# Patient Record
Sex: Female | Born: 1945 | Race: White | Hispanic: No | Smoking: Current some day smoker
Health system: Southern US, Community
[De-identification: ages and names within clinical notes are randomized; demographics above are authoritative.]

## PROBLEM LIST (undated history)

## (undated) DIAGNOSIS — F329 Major depressive disorder, single episode, unspecified: Secondary | ICD-10-CM

## (undated) DIAGNOSIS — E7800 Pure hypercholesterolemia, unspecified: Secondary | ICD-10-CM

## (undated) DIAGNOSIS — R87619 Unspecified abnormal cytological findings in specimens from cervix uteri: Secondary | ICD-10-CM

## (undated) DIAGNOSIS — R7982 Elevated C-reactive protein (CRP): Secondary | ICD-10-CM

## (undated) DIAGNOSIS — Z8742 Personal history of other diseases of the female genital tract: Secondary | ICD-10-CM

## (undated) DIAGNOSIS — F32A Depression, unspecified: Secondary | ICD-10-CM

## (undated) DIAGNOSIS — K219 Gastro-esophageal reflux disease without esophagitis: Secondary | ICD-10-CM

## (undated) DIAGNOSIS — E782 Mixed hyperlipidemia: Secondary | ICD-10-CM

## (undated) DIAGNOSIS — G250 Essential tremor: Secondary | ICD-10-CM

## (undated) HISTORY — DX: Pure hypercholesterolemia, unspecified: E78.00

## (undated) HISTORY — DX: Gastro-esophageal reflux disease without esophagitis: K21.9

## (undated) HISTORY — DX: Elevated C-reactive protein (CRP): R79.82

## (undated) HISTORY — DX: Essential tremor: G25.0

## (undated) HISTORY — PX: EYE SURGERY: SHX253

## (undated) HISTORY — DX: Personal history of other diseases of the female genital tract: Z87.42

## (undated) HISTORY — DX: Major depressive disorder, single episode, unspecified: F32.9

## (undated) HISTORY — DX: Depression, unspecified: F32.A

## (undated) HISTORY — DX: Mixed hyperlipidemia: E78.2

## (undated) HISTORY — DX: Unspecified abnormal cytological findings in specimens from cervix uteri: R87.619

---
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
## 1975-10-17 HISTORY — PX: UTERINE FIBROID SURGERY: SHX826

## 1979-10-17 HISTORY — PX: LAPAROSCOPY: SHX197

## 1990-10-16 HISTORY — PX: BREAST LUMPECTOMY: SHX2

## 1998-12-28 ENCOUNTER — Other Ambulatory Visit: Admission: RE | Admit: 1998-12-28 | Discharge: 1998-12-28 | Payer: Self-pay | Admitting: Gynecology

## 2000-01-16 ENCOUNTER — Other Ambulatory Visit: Admission: RE | Admit: 2000-01-16 | Discharge: 2000-01-16 | Payer: Self-pay | Admitting: Gynecology

## 2001-02-18 ENCOUNTER — Other Ambulatory Visit: Admission: RE | Admit: 2001-02-18 | Discharge: 2001-02-18 | Payer: Self-pay | Admitting: Gynecology

## 2002-03-04 ENCOUNTER — Other Ambulatory Visit: Admission: RE | Admit: 2002-03-04 | Discharge: 2002-03-04 | Payer: Self-pay | Admitting: Obstetrics & Gynecology

## 2003-08-11 ENCOUNTER — Other Ambulatory Visit: Admission: RE | Admit: 2003-08-11 | Discharge: 2003-08-11 | Payer: Self-pay | Admitting: Gynecology

## 2003-09-21 ENCOUNTER — Emergency Department (HOSPITAL_COMMUNITY): Admission: EM | Admit: 2003-09-21 | Discharge: 2003-09-21 | Payer: Self-pay | Admitting: Emergency Medicine

## 2004-10-16 LAB — HM DEXA SCAN

## 2005-01-14 DIAGNOSIS — R7982 Elevated C-reactive protein (CRP): Secondary | ICD-10-CM

## 2005-01-14 DIAGNOSIS — E782 Mixed hyperlipidemia: Secondary | ICD-10-CM

## 2005-01-14 DIAGNOSIS — E7800 Pure hypercholesterolemia, unspecified: Secondary | ICD-10-CM

## 2005-01-14 HISTORY — DX: Pure hypercholesterolemia, unspecified: E78.00

## 2005-01-14 HISTORY — DX: Mixed hyperlipidemia: E78.2

## 2005-01-14 HISTORY — DX: Elevated C-reactive protein (CRP): R79.82

## 2005-01-18 ENCOUNTER — Other Ambulatory Visit: Admission: RE | Admit: 2005-01-18 | Discharge: 2005-01-18 | Payer: Self-pay | Admitting: Family Medicine

## 2006-03-01 ENCOUNTER — Other Ambulatory Visit: Admission: RE | Admit: 2006-03-01 | Discharge: 2006-03-01 | Payer: Self-pay | Admitting: Family Medicine

## 2006-10-16 HISTORY — PX: COSMETIC SURGERY: SHX468

## 2007-03-04 ENCOUNTER — Other Ambulatory Visit: Admission: RE | Admit: 2007-03-04 | Discharge: 2007-03-04 | Payer: Self-pay | Admitting: Family Medicine

## 2008-03-04 ENCOUNTER — Other Ambulatory Visit: Admission: RE | Admit: 2008-03-04 | Discharge: 2008-03-04 | Payer: Self-pay | Admitting: Family Medicine

## 2009-03-04 ENCOUNTER — Other Ambulatory Visit: Admission: RE | Admit: 2009-03-04 | Discharge: 2009-03-04 | Payer: Self-pay | Admitting: Family Medicine

## 2010-04-28 LAB — HM MAMMOGRAPHY: HM Mammogram: NEGATIVE

## 2011-02-15 ENCOUNTER — Encounter: Payer: Self-pay | Admitting: Family Medicine

## 2011-02-15 DIAGNOSIS — G43909 Migraine, unspecified, not intractable, without status migrainosus: Secondary | ICD-10-CM | POA: Insufficient documentation

## 2011-02-27 ENCOUNTER — Encounter: Payer: Self-pay | Admitting: Family Medicine

## 2011-02-27 ENCOUNTER — Ambulatory Visit (INDEPENDENT_AMBULATORY_CARE_PROVIDER_SITE_OTHER): Payer: 59 | Admitting: Family Medicine

## 2011-02-27 VITALS — BP 100/64 | HR 64 | Ht 64.5 in | Wt 157.0 lb

## 2011-02-27 DIAGNOSIS — M79609 Pain in unspecified limb: Secondary | ICD-10-CM

## 2011-02-27 DIAGNOSIS — E7800 Pure hypercholesterolemia, unspecified: Secondary | ICD-10-CM | POA: Insufficient documentation

## 2011-02-27 DIAGNOSIS — M79606 Pain in leg, unspecified: Secondary | ICD-10-CM

## 2011-02-27 DIAGNOSIS — G252 Other specified forms of tremor: Secondary | ICD-10-CM

## 2011-02-27 DIAGNOSIS — G250 Essential tremor: Secondary | ICD-10-CM | POA: Insufficient documentation

## 2011-02-27 NOTE — Progress Notes (Signed)
Patient presents to re-establish care (former patient of mine at [place]).  Saw a physician at [place] at [facility] on 10/26/10 and got her medications refilled, but prefers to remain under my care. Had labs done in January, but never got results.  Scheduled for CPE in June.  Essential tremor--previously cared for by Dr. Epimenio Foot, but released to my care on her current, stable medical regimen. Doing well on this regimen.  Elevated cholesterol--denies side effects or problems from the Crestor.  Had some problems initially taking the 40mg , had to cut to 20mg , but eventually got back up to full 40mg  and is tolerating just fine.  Previously didn't do well with Lipitor.  Had a pinched nerve in her L leg back in September (similar to a problem year ago).  Seems to be intermittent, comes and goes.  Sometimes is from hip down to the foot (laterally), other times is not the whole leg.  Isn't bothering her now.  When it hurts, it bothers her to sleep on the left side.  Doing better overall, tries to avoid sleeping on left side.  Takes Aleve prn with good results.  Denies ever having back pain, denies weakness or numbness/tingling.  Past Medical History  Diagnosis Date  . GERD (gastroesophageal reflux disease)   . Tremor, essential   . Migraine   . Depression   . History of endometriosis   . Elevated cholesterol 01/2005  . Elevated triglycerides with high cholesterol 01/2005  . Elevated C-reactive protein (CRP) 01/2005    Past Surgical History  Procedure Date  . Colonoscopy 05/26/2008  . Uterine fibroid surgery 1977  . Appendectomy 1977  . Laproscopy 1981    endometriosis  . Abdominal adhesion surgery 1981  . R breast lump 1992    benign  . Tonsilectomy, adenoidectomy, bilateral myringotomy and tubes 1953    age 65  . Plastic surgery-neck 2008    History   Social History  . Marital Status: Divorced    Spouse Name: N/A    Number of Children: 1  . Years of Education: N/A   Occupational History    . Retired 06/2010 [employer]   Social History Main Topics  . Smoking status: Current Everyday Smoker -- 0.5 packs/day for 30 years    Types: Cigarettes  . Smokeless tobacco: Not on file   Comment: 1/2 ppd  . Alcohol Use: Yes     rarely, 1-2 times per year   . Drug Use: No  . Sexually Active: Not on file   Other Topics Concern  . Not on file   Social History Narrative  . No narrative on file    Family History  Problem Relation Age of Onset  . Diabetes Mother   . Cancer Father     throat  . Heart disease Father 63  . Stroke Father 96  . Depression Brother     Current outpatient prescriptions:aspirin 81 MG tablet, Take 81 mg by mouth daily.  , Disp: , Rfl: ;  Calcium Carbonate-Vitamin D (CALCIUM + D) 600-200 MG-UNIT TABS, Take 1,200 mg by mouth daily.  , Disp: , Rfl: ;  clonazePAM (KLONOPIN) 2 MG tablet, Take 2 mg by mouth 2 (two) times daily as needed.  , Disp: , Rfl: ;  nadolol (CORGARD) 40 MG tablet, Take 40 mg by mouth 3 (three) times daily.  , Disp: , Rfl:  Naproxen Sodium (ALEVE) 220 MG CAPS, Take 1 each by mouth as needed.  , Disp: , Rfl: ;  omeprazole (PRILOSEC) 20 MG capsule, Take 20 mg by mouth daily.  , Disp: , Rfl: ;  rosuvastatin (CRESTOR) 40 MG tablet, Take 40 mg by mouth daily.  , Disp: , Rfl: ;  sertraline (ZOLOFT) 100 MG tablet, Take 100 mg by mouth daily.  , Disp: , Rfl:   No Known Allergies  ROS:  Denies fever, n/v/d or other bowel problems, no urinary symptoms, rash, URI symptoms. No chest pain, SOB, edema. Denies depression/anxiety (controlled).  See HPI and patient's ROS on forms  Physical Exam: BP 100/64  Pulse 64  Ht 5' 4.5" (1.638 m)  Wt 157 lb (71.215 kg)  BMI 26.53 kg/m2 Well developed, pleasant female in no distress HEENT:  PERRL, EOMI, conjunctiva clear; OP clear Neck: No lymphadenopathy or thyromegaly, no carotid bruit Heart:  Regular rate and rhythm, no murmurs, rubs, gallops or ectopy Lungs:  Clear bilaterally, without wheezes, rales or  ronchi Abdomen:  Soft, nontender, nondistended, no hepatosplenomegaly or masses, normal bowel sounds Extremities:  No clubbing, cyanosis or edema, 2+ pulses. No tenderness at trochanteric bursa or along ITB Neuro:  Alert and oriented x 3, cranial nerves grossly intact.  DTR's 2+ and symmetric.  Normal strength and sensation Back:  No spine or CVA tenderness Skin: no rashes or suspicious lesions Psych:  Normal mood, affect, hygiene and grooming, normal speech, eye contact  Assessment and Plan: 1. Leg pain   2. Benign essential tremor   3. Pure hypercholesterolemia    Leg pain--intermittent, currently asymptomatic.  May be radicular.  Continue with OTC NSAID's prn.  F/U for re-eval if worsening pain, weakness, numbness or other concerns.  Discussed stretches, heat/ice prn.  Tremor--well controlled on current regimen.  Doesn't need refills at this time  Hyperlipidemia--Await [facility] records for review of labs.  Per patient, had labs in January.  Will need future orders put in for labs prior to her CPE next month.  Continue current regimen and low cholesterol diet

## 2011-03-16 ENCOUNTER — Other Ambulatory Visit: Payer: Self-pay | Admitting: Family Medicine

## 2011-03-16 DIAGNOSIS — E7800 Pure hypercholesterolemia, unspecified: Secondary | ICD-10-CM

## 2011-03-22 ENCOUNTER — Other Ambulatory Visit: Payer: 59

## 2011-03-22 DIAGNOSIS — E7800 Pure hypercholesterolemia, unspecified: Secondary | ICD-10-CM

## 2011-03-22 LAB — COMPREHENSIVE METABOLIC PANEL
AST: 23 U/L (ref 0–37)
Albumin: 4.6 g/dL (ref 3.5–5.2)
Alkaline Phosphatase: 52 U/L (ref 39–117)
Glucose, Bld: 95 mg/dL (ref 70–99)
Potassium: 4.6 mEq/L (ref 3.5–5.3)
Sodium: 140 mEq/L (ref 135–145)
Total Bilirubin: 0.5 mg/dL (ref 0.3–1.2)
Total Protein: 6.4 g/dL (ref 6.0–8.3)

## 2011-03-22 LAB — LIPID PANEL
HDL: 54 mg/dL (ref >39)
LDL Cholesterol: 75 mg/dL (ref 0–99)
Total CHOL/HDL Ratio: 3.1 Ratio
VLDL: 39 mg/dL (ref 0–40)

## 2011-03-23 ENCOUNTER — Encounter: Payer: Self-pay | Admitting: Family Medicine

## 2011-03-30 ENCOUNTER — Other Ambulatory Visit: Payer: Self-pay | Admitting: *Deleted

## 2011-03-30 ENCOUNTER — Encounter: Payer: Self-pay | Admitting: Family Medicine

## 2011-03-30 ENCOUNTER — Other Ambulatory Visit (HOSPITAL_COMMUNITY)
Admission: RE | Admit: 2011-03-30 | Discharge: 2011-03-30 | Disposition: A | Discharge status: Home / Self Care | Payer: 59 | Source: Ambulatory Visit | Attending: Family Medicine | Admitting: Family Medicine

## 2011-03-30 ENCOUNTER — Ambulatory Visit (INDEPENDENT_AMBULATORY_CARE_PROVIDER_SITE_OTHER): Payer: 59 | Admitting: Family Medicine

## 2011-03-30 VITALS — BP 122/84 | HR 64 | Ht 64.5 in | Wt 161.0 lb

## 2011-03-30 DIAGNOSIS — F17200 Nicotine dependence, unspecified, uncomplicated: Secondary | ICD-10-CM

## 2011-03-30 DIAGNOSIS — F3289 Other specified depressive episodes: Secondary | ICD-10-CM

## 2011-03-30 DIAGNOSIS — Z0000 Encounter for general adult medical examination without abnormal findings: Secondary | ICD-10-CM

## 2011-03-30 DIAGNOSIS — Z01419 Encounter for gynecological examination (general) (routine) without abnormal findings: Secondary | ICD-10-CM | POA: Insufficient documentation

## 2011-03-30 DIAGNOSIS — G250 Essential tremor: Secondary | ICD-10-CM

## 2011-03-30 DIAGNOSIS — F329 Major depressive disorder, single episode, unspecified: Secondary | ICD-10-CM

## 2011-03-30 DIAGNOSIS — E781 Pure hyperglyceridemia: Secondary | ICD-10-CM | POA: Insufficient documentation

## 2011-03-30 LAB — POCT URINALYSIS DIPSTICK
Bilirubin, UA: NEGATIVE
Ketones, UA: NEGATIVE
Leukocytes, UA: NEGATIVE
Protein, UA: NEGATIVE
Spec Grav, UA: 1.01
pH, UA: 5

## 2011-03-30 LAB — HM PAP SMEAR: HM Pap smear: NORMAL

## 2011-03-30 MED ORDER — CLONAZEPAM 1 MG PO TABS: 1.0000 mg | ORAL_TABLET | Freq: Three times a day (TID) | ORAL | Status: DC | PRN

## 2011-03-30 NOTE — Progress Notes (Signed)
Subjective:    Patient ID: Lindsey Frank, female    DOB: [birth year], 65 y.o.   MRN: [id number]  HPI Lindsey Frank is a 65 y.o. female who presents for a complete physical.  She has the following concerns: She needs refill on clonazepam.  She is now motivated to quit smoking. Tried Chantix in past and had unusual dreams--doesn't want to use again.  Immunization History  Administered Date(s) Administered  . DTaP 04/17/2010  . Pneumococcal Conjugate 04/17/2010  . Td 10/16/2000  . Zoster 03/03/2007   Last Pap smear: last year Last mammogram: due now Last colonoscopy: 8/09 Last DEXA: normal in the past (2-3 years ago) Dentist: goes twice a year Ophtho: yearly Exercise: None  Past Medical History  Diagnosis Date  . GERD (gastroesophageal reflux disease)   . Tremor, essential   . Migraine   . Depression   . History of endometriosis   . Elevated cholesterol 01/2005  . Elevated triglycerides with high cholesterol 01/2005  . Elevated C-reactive protein (CRP) 01/2005    Past Surgical History  Procedure Date  . Colonoscopy 05/26/2008  . Uterine fibroid surgery 1977  . Appendectomy 1977  . Laproscopy 1981    endometriosis  . Abdominal adhesion surgery 1981  . R breast lump 1992    benign  . Tonsilectomy, adenoidectomy, bilateral myringotomy and tubes 1953    age 44  . Plastic surgery-neck 2008    History   Social History  . Marital Status: Divorced    Spouse Name: N/A    Number of Children: 1  . Years of Education: N/A   Occupational History  . 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 Smoking status: Current Everyday Smoker -- 0.5 packs/day for 30 years    Types: Cigarettes  . Smokeless tobacco: Not on file   Comment: 1/2 ppd  . Alcohol Use: Yes     rarely, 1-2 times per year   . Drug Use: No  . Sexually Active: Not on file   Other Topics Concern  . Not on file   Social History Narrative  . No narrative on file     Family History  Problem Relation Age of Onset  . Diabetes Mother   . Cancer Father     throat  . Heart disease Father 12  . Stroke Father 33  . Depression Brother     Current outpatient prescriptions:aspirin 81 MG tablet, Take 81 mg by mouth daily.  , Disp: , Rfl: ;  Calcium Carbonate-Vitamin D (CALCIUM + D) 600-200 MG-UNIT TABS, Take 1,200 mg by mouth daily.  , Disp: , Rfl: ;  clonazePAM (KLONOPIN) 1 MG tablet, Take 1 tablet (1 mg total) by mouth 3 (three) times daily as needed for anxiety. 1/2 tablet in the morning, 1/2 tablet at noon, 1 tablet at bedtime, Disp: 180 tablet, Rfl: 1 nadolol (CORGARD) 40 MG tablet, Take 40 mg by mouth 3 (three) times daily.  , Disp: , Rfl: ;  Naproxen Sodium (ALEVE) 220 MG CAPS, Take 1 each by mouth as needed.  , Disp: , Rfl: ;  omeprazole (PRILOSEC) 20 MG capsule, Take 20 mg by mouth daily.  , Disp: , Rfl: ;  rosuvastatin (CRESTOR) 40 MG tablet, Take 40 mg by mouth daily.  , Disp: , Rfl: ;  sertraline (ZOLOFT) 100 MG tablet, Take 100 mg by mouth daily.  , Disp: , Rfl:  DISCONTD: clonazePAM (KLONOPIN) 1 MG tablet, Take 1 mg by  mouth. 1/2 tablet in the morning, 1/2 tablet at noon, 1 tablet at bedtime , Disp: , Rfl: ;  DISCONTD: clonazePAM (KLONOPIN) 2 MG tablet, Take 2 mg by mouth 2 (two) times daily as needed.  , Disp: , Rfl:   No Known Allergies  Review of Systems The patient denies anorexia, fever, headaches,  vision changes, decreased hearing, ear pain, sore throat, breast concerns, chest pain, palpitations, dizziness, syncope, dyspnea on exertion, cough, swelling, nausea, vomiting, diarrhea, constipation, abdominal pain, melena, hematochezia, indigestion/heartburn, hematuria, incontinence, dysuria ,no postmenopausal bleeding, vaginal discharge, odor or itch, genital lesions, joint pains, numbness, tingling, weakness, suspicious skin lesions, depression, anxiety, abnormal bleeding/bruising, or enlarged lymph nodes.  5 pound weight gain, essential tremor,  recurrent reflux symptoms if misses Prilosec.  UTI in April, symptoms resolved     Objective:   Physical Exam BP 122/84  Pulse 64  Ht 5' 4.5" (1.638 m)  Wt 161 lb (73.029 kg)  BMI 27.21 kg/m2  General Appearance:    Alert, cooperative, no distress, appears stated age  Head:    Normocephalic, without obvious abnormality, atraumatic  Eyes:    PERRL, conjunctiva/corneas clear, EOM's intact, fundi    benign  Ears:    Normal TM's and external ear canals  Nose:   Nares normal, mucosa normal, no drainage or sinus   tenderness  Throat:   Lips, mucosa, and tongue normal; teeth and gums normal  Neck:   Supple, no lymphadenopathy;  thyroid:  no   enlargement/tenderness/nodules; no carotid   bruit or JVD  Back:    Spine nontender, no curvature, ROM normal, no CVA     tenderness  Lungs:     Clear to auscultation bilaterally without wheezes, rales or     ronchi; respirations unlabored  Chest Wall:    No tenderness or deformity   Heart:    Regular rate and rhythm, S1 and S2 normal, no murmur, rub   or gallop  Breast Exam:    No tenderness, masses, or nipple discharge or inversion.      No axillary lymphadenopathy. WHSS R breast  Abdomen:     Soft, non-tender, nondistended, normoactive bowel sounds,    no masses, no hepatosplenomegaly  Genitalia:    Normal external genitalia without lesions.  BUS and vagina normal; cervix without lesions, or cervical motion tenderness. No abnormal vaginal discharge.  Uterus and adnexa not enlarged, nontender, no masses.  Pap performed  Rectal:    Normal tone, no masses or tenderness; guaiac negative stool  Extremities:   No clubbing, cyanosis or edema  Pulses:   2+ and symmetric all extremities  Skin:   Skin color, texture, turgor normal, no rashes or lesions  Lymph nodes:   Cervical, supraclavicular, and axillary nodes normal  Neurologic:   CNII-XII intact, normal strength, sensation and gait; reflexes 2+ and symmetric throughout. Minimal tremor          Psych:    Normal mood, affect, hygiene and grooming.          Assessment & Plan:   1. Routine general medical examination at a health care facility  POCT urinalysis dipstick, Visual acuity screening, Cytology - PAP  2. Pure hyperglyceridemia    3. Benign essential tremor  DISCONTINUED: clonazePAM (KLONOPIN) 1 MG tablet  4. Depressive disorder, not elsewhere classified    5. Tobacco use disorder     Discussed monthly self breast exams and yearly mammograms after the age of 21; at least 30 minutes of aerobic activity at  least 5 days/week; proper sunscreen use reviewed; healthy diet, including goals of calcium and vitamin D intake and alcohol recommendations (less than or equal to 1 drink/day) reviewed; regular seatbelt use; changing batteries in smoke detectors.  Immunization recommendations discussed--UTD, flu vaccines annually.  Colonoscopy recommendations reviewed--due again 2019  Patient was encouraged to quit smoking.  Discussed risks of smoking.  Instructed to start thinking about why/when patient smokes in order to come up with effective strategies to cut back or quit. Discussed available resources, including free counseling through [facility], smoking cessation classes through [facility], OTC nicotine replacements, and the possibility of assistance with prescription medication if patients own strategies fail and if patient is motivated to quit.  Printed Klonopin refill given to pt for 3 months with 1 refill  Put in future orders for 6 month labs

## 2011-04-04 ENCOUNTER — Encounter: Payer: Self-pay | Admitting: Family Medicine

## 2011-05-15 ENCOUNTER — Other Ambulatory Visit: Payer: Self-pay | Admitting: *Deleted

## 2011-05-15 DIAGNOSIS — I10 Essential (primary) hypertension: Secondary | ICD-10-CM

## 2011-05-15 MED ORDER — NADOLOL 40 MG PO TABS: 40.0000 mg | ORAL_TABLET | Freq: Three times a day (TID) | ORAL | Status: DC

## 2011-10-19 ENCOUNTER — Telehealth: Payer: Self-pay | Admitting: Family Medicine

## 2011-10-19 ENCOUNTER — Other Ambulatory Visit: Payer: Self-pay | Admitting: *Deleted

## 2011-10-19 DIAGNOSIS — G250 Essential tremor: Secondary | ICD-10-CM

## 2011-10-19 DIAGNOSIS — E782 Mixed hyperlipidemia: Secondary | ICD-10-CM

## 2011-10-19 DIAGNOSIS — Z79899 Other long term (current) drug therapy: Secondary | ICD-10-CM

## 2011-10-19 MED ORDER — CLONAZEPAM 1 MG PO TABS: 1.0000 mg | ORAL_TABLET | Freq: Three times a day (TID) | ORAL | Status: DC | PRN

## 2011-10-19 NOTE — Telephone Encounter (Signed)
She is due for OV with lipid and hepatic panel prior (272.2, v58.69).  Okay to refill clonazepam x 6 mos as requested.

## 2011-10-19 NOTE — Telephone Encounter (Signed)
Spoke with patient and let her know that I called in her clonazepam #180 with 1 refill to [facility]. Patient is scheduled for fasting labs 10/23/11 and appt with Dr.Knapp to followon 10/26/11.

## 2011-10-23 ENCOUNTER — Other Ambulatory Visit: Payer: 59

## 2011-10-23 DIAGNOSIS — E782 Mixed hyperlipidemia: Secondary | ICD-10-CM

## 2011-10-23 DIAGNOSIS — Z79899 Other long term (current) drug therapy: Secondary | ICD-10-CM

## 2011-10-23 LAB — LIPID PANEL
Cholesterol: 173 mg/dL (ref 0–200)
HDL: 56 mg/dL (ref >39)
LDL Cholesterol: 72 mg/dL (ref 0–99)
Triglycerides: 226 mg/dL — ABNORMAL HIGH (ref <150)
VLDL: 45 mg/dL — ABNORMAL HIGH (ref 0–40)

## 2011-10-23 LAB — HEPATIC FUNCTION PANEL
ALT: 18 U/L (ref 0–35)
Albumin: 4.7 g/dL (ref 3.5–5.2)
Alkaline Phosphatase: 64 U/L (ref 39–117)
Indirect Bilirubin: 0.4 mg/dL (ref 0.0–0.9)
Total Protein: 6.8 g/dL (ref 6.0–8.3)

## 2011-10-26 ENCOUNTER — Encounter: Payer: Self-pay | Admitting: Family Medicine

## 2011-10-26 ENCOUNTER — Ambulatory Visit (INDEPENDENT_AMBULATORY_CARE_PROVIDER_SITE_OTHER): Payer: 59 | Admitting: Family Medicine

## 2011-10-26 VITALS — BP 110/74 | HR 60 | Ht 64.5 in | Wt 168.0 lb

## 2011-10-26 DIAGNOSIS — E782 Mixed hyperlipidemia: Secondary | ICD-10-CM

## 2011-10-26 DIAGNOSIS — G250 Essential tremor: Secondary | ICD-10-CM

## 2011-10-26 DIAGNOSIS — I10 Essential (primary) hypertension: Secondary | ICD-10-CM

## 2011-10-26 MED ORDER — NADOLOL 40 MG PO TABS: ORAL_TABLET | ORAL | Status: DC

## 2011-10-26 NOTE — Progress Notes (Signed)
Patient presents for 6 month follow up on hyperlipidemia.  Had some muscle aches when increased from 40 mg every other day to taking it daily, similar to when on Lipitor.  But she started a walking regimen, and symptoms are better.  Compliant with taking Crestor 40 mg every day. Admits diet wasn't as good during the holidays.  Hasn't been taking fish oil, has had some side effects in the past.  Tremor is well controlled on her current regimen.  Needs refill of Nadolol.  Just had Klonipin refilled.  Lab Results  Component Value Date   CHOL 173 10/23/2011   HDL 56 10/23/2011   LDLCALC 72 10/23/2011   TRIG 226* 10/23/2011   CHOLHDL 3.1 10/23/2011   Lab Results  Component Value Date   ALT 18 10/23/2011   AST 24 10/23/2011   ALKPHOS 64 10/23/2011   BILITOT 0.5 10/23/2011   Past Medical History  Diagnosis Date  . GERD (gastroesophageal reflux disease)   . Tremor, essential   . Migraine   . Depression   . History of endometriosis   . Elevated cholesterol 01/2005  . Elevated triglycerides with high cholesterol 01/2005  . Elevated C-reactive protein (CRP) 01/2005    Past Surgical History  Procedure Date  . Colonoscopy 05/26/2008  . Uterine fibroid surgery 1977  . Appendectomy 1977  . Laproscopy 1981    endometriosis  . Abdominal adhesion surgery 1981  . R breast lump 1992    benign  . Tonsilectomy, adenoidectomy, bilateral myringotomy and tubes 1953    age 70  . Plastic surgery-neck 2008    History   Social History  . Marital Status: Divorced    Spouse Name: N/A    Number of Children: 1  . Years of Education: N/A   Occupational History  . 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 Smoking status: Current Everyday Smoker -- 0.5 packs/day for 30 years    Types: Cigarettes  . Smokeless tobacco: Not on file   Comment: 1/2 ppd  . Alcohol Use: Yes     rarely, 1-2 times per year   . Drug Use: No  . Sexually Active: Not on file   Other Topics Concern  . Not  on file   Social History Narrative  . No narrative on file    Family History  Problem Relation Age of Onset  . Diabetes Mother   . Cancer Father     throat  . Heart disease Father 27  . Stroke Father 68  . Depression Brother    Current Outpatient Prescriptions on File Prior to Visit  Medication Sig Dispense Refill  . aspirin 81 MG tablet Take 81 mg by mouth daily.        . Calcium Carbonate-Vitamin D (CALCIUM + D) 600-200 MG-UNIT TABS Take 1,200 mg by mouth daily.        . clonazePAM (KLONOPIN) 1 MG tablet Take 1 tablet (1 mg total) by mouth 3 (three) times daily as needed for anxiety. 1/2 tablet in the morning, 1/2 tablet at noon, 1 tablet at bedtime  180 tablet  1  . Naproxen Sodium (ALEVE) 220 MG CAPS Take 1 each by mouth as needed.        Marland Kitchen omeprazole (PRILOSEC) 20 MG capsule Take 20 mg by mouth daily.        . rosuvastatin (CRESTOR) 40 MG tablet Take 40 mg by mouth daily.        Marland Kitchen  sertraline (ZOLOFT) 100 MG tablet Take 100 mg by mouth daily.         No Known Allergies  ROS:  Denies nausea, vomiting, chest pain, palpitations, headaches, fever, URI symptoms  PHYSICAL EXAM: BP 110/74  Pulse 60  Ht 5' 4.5" (1.638 m)  Wt 168 lb (76.204 kg)  BMI 28.39 kg/m2 Well developed, pleasant female, no distress Neck: no lymphadenopathy Heart: regular rate and rhythm without murmur Lungs: clear bilaterally Abdomen: soft, nontender, no organomegaly or mass Extremities: no edema Skin: no rash Psych: normal mood, affect, hygiene and grooming Neuro: alert, oriented, minimal tremor  ASSESSMENT/PLAN: 1. Mixed hyperlipidemia    2. Benign essential tremor  nadolol (CORGARD) 40 MG tablet   controlled with klonipin and nadolol  3. Unspecified essential hypertension  DISCONTINUED: nadolol (CORGARD) 40 MG tablet   this dx is incorrect.  Nadolol was associated with this dx in error, when it is rx'd for tremor--unable to delete from visit dx due to associated rx sent   TG above goal,  related to recent diet. Continue Crestor 40mg .  Dietary changes reviewed.  Re-try fish oil  F/u 6 months with labs prior

## 2011-10-26 NOTE — Patient Instructions (Signed)
Cut back on snacking, sweets, fried foods.  Try fish oil again--3000-4000 mg daily Keep up your exercise program  Continue your current medications

## 2011-11-20 ENCOUNTER — Telehealth: Payer: Self-pay | Admitting: Family Medicine

## 2011-11-20 NOTE — Telephone Encounter (Signed)
Called patient and let her know that I did escribe the medication on 10/19/11, I called [facility] and they told me that this particular medication cannot be escribed so I went ahead and recalled to the pharmacist today for #180 clonazepam #180 with 1 refill and I called patient to explain and make sure that she had enough until her prescription arrived, she said she did.

## 2012-01-25 ENCOUNTER — Telehealth: Payer: Self-pay | Admitting: Family Medicine

## 2012-01-25 DIAGNOSIS — G250 Essential tremor: Secondary | ICD-10-CM

## 2012-01-25 MED ORDER — CLONAZEPAM 1 MG PO TABS: 1.0000 mg | ORAL_TABLET | Freq: Three times a day (TID) | ORAL | Status: DC | PRN

## 2012-01-25 NOTE — Telephone Encounter (Signed)
Okay for #90, no refill 

## 2012-01-25 NOTE — Telephone Encounter (Signed)
Called in #180 no refill to [facility].

## 2012-01-25 NOTE — Telephone Encounter (Signed)
Patient is changing pharmacies, no longer using [facility].

## 2012-01-25 NOTE — Telephone Encounter (Signed)
This was sent in January to [facility] for 3 month supply with a refill.  Is she no longer getting meds from [facility]? (should have refill)

## 2012-02-08 ENCOUNTER — Telehealth: Payer: Self-pay | Admitting: Family Medicine

## 2012-02-08 DIAGNOSIS — G250 Essential tremor: Secondary | ICD-10-CM

## 2012-02-08 MED ORDER — NADOLOL 40 MG PO TABS: ORAL_TABLET | ORAL | Status: DC

## 2012-02-08 NOTE — Telephone Encounter (Signed)
Sent in #270 no refill to pharmacy

## 2012-02-08 NOTE — Telephone Encounter (Signed)
Was "no print", probably was supposed to be sent "normal"--check with Martie Lee and re-do if necessary.  Thanks

## 2012-02-08 NOTE — Telephone Encounter (Deleted)
Is this okay?

## 2012-03-20 DIAGNOSIS — M545 Low back pain, unspecified: Secondary | ICD-10-CM | POA: Diagnosis not present

## 2012-03-20 DIAGNOSIS — M999 Biomechanical lesion, unspecified: Secondary | ICD-10-CM | POA: Diagnosis not present

## 2012-03-21 DIAGNOSIS — M999 Biomechanical lesion, unspecified: Secondary | ICD-10-CM | POA: Diagnosis not present

## 2012-03-21 DIAGNOSIS — M545 Low back pain, unspecified: Secondary | ICD-10-CM | POA: Diagnosis not present

## 2012-03-22 DIAGNOSIS — M999 Biomechanical lesion, unspecified: Secondary | ICD-10-CM | POA: Diagnosis not present

## 2012-03-22 DIAGNOSIS — M545 Low back pain, unspecified: Secondary | ICD-10-CM | POA: Diagnosis not present

## 2012-03-25 DIAGNOSIS — M545 Low back pain, unspecified: Secondary | ICD-10-CM | POA: Diagnosis not present

## 2012-03-25 DIAGNOSIS — M999 Biomechanical lesion, unspecified: Secondary | ICD-10-CM | POA: Diagnosis not present

## 2012-03-26 DIAGNOSIS — M545 Low back pain, unspecified: Secondary | ICD-10-CM | POA: Diagnosis not present

## 2012-03-26 DIAGNOSIS — M999 Biomechanical lesion, unspecified: Secondary | ICD-10-CM | POA: Diagnosis not present

## 2012-03-27 DIAGNOSIS — M545 Low back pain, unspecified: Secondary | ICD-10-CM | POA: Diagnosis not present

## 2012-03-27 DIAGNOSIS — M999 Biomechanical lesion, unspecified: Secondary | ICD-10-CM | POA: Diagnosis not present

## 2012-03-28 DIAGNOSIS — M545 Low back pain, unspecified: Secondary | ICD-10-CM | POA: Diagnosis not present

## 2012-03-28 DIAGNOSIS — M999 Biomechanical lesion, unspecified: Secondary | ICD-10-CM | POA: Diagnosis not present

## 2012-04-01 DIAGNOSIS — M999 Biomechanical lesion, unspecified: Secondary | ICD-10-CM | POA: Diagnosis not present

## 2012-04-01 DIAGNOSIS — M545 Low back pain, unspecified: Secondary | ICD-10-CM | POA: Diagnosis not present

## 2012-04-02 DIAGNOSIS — M545 Low back pain, unspecified: Secondary | ICD-10-CM | POA: Diagnosis not present

## 2012-04-02 DIAGNOSIS — M999 Biomechanical lesion, unspecified: Secondary | ICD-10-CM | POA: Diagnosis not present

## 2012-04-03 DIAGNOSIS — M545 Low back pain, unspecified: Secondary | ICD-10-CM | POA: Diagnosis not present

## 2012-04-03 DIAGNOSIS — M999 Biomechanical lesion, unspecified: Secondary | ICD-10-CM | POA: Diagnosis not present

## 2012-04-08 DIAGNOSIS — M545 Low back pain, unspecified: Secondary | ICD-10-CM | POA: Diagnosis not present

## 2012-04-08 DIAGNOSIS — M999 Biomechanical lesion, unspecified: Secondary | ICD-10-CM | POA: Diagnosis not present

## 2012-04-09 DIAGNOSIS — M545 Low back pain, unspecified: Secondary | ICD-10-CM | POA: Diagnosis not present

## 2012-04-09 DIAGNOSIS — M999 Biomechanical lesion, unspecified: Secondary | ICD-10-CM | POA: Diagnosis not present

## 2012-04-10 DIAGNOSIS — M545 Low back pain, unspecified: Secondary | ICD-10-CM | POA: Diagnosis not present

## 2012-04-10 DIAGNOSIS — M999 Biomechanical lesion, unspecified: Secondary | ICD-10-CM | POA: Diagnosis not present

## 2012-04-12 DIAGNOSIS — M999 Biomechanical lesion, unspecified: Secondary | ICD-10-CM | POA: Diagnosis not present

## 2012-04-12 DIAGNOSIS — M545 Low back pain, unspecified: Secondary | ICD-10-CM | POA: Diagnosis not present

## 2012-04-15 DIAGNOSIS — M545 Low back pain, unspecified: Secondary | ICD-10-CM | POA: Diagnosis not present

## 2012-04-15 DIAGNOSIS — M999 Biomechanical lesion, unspecified: Secondary | ICD-10-CM | POA: Diagnosis not present

## 2012-04-16 ENCOUNTER — Telehealth: Payer: Self-pay | Admitting: Family Medicine

## 2012-04-16 DIAGNOSIS — M999 Biomechanical lesion, unspecified: Secondary | ICD-10-CM | POA: Diagnosis not present

## 2012-04-16 DIAGNOSIS — M545 Low back pain, unspecified: Secondary | ICD-10-CM | POA: Diagnosis not present

## 2012-04-16 DIAGNOSIS — F3289 Other specified depressive episodes: Secondary | ICD-10-CM

## 2012-04-16 DIAGNOSIS — E782 Mixed hyperlipidemia: Secondary | ICD-10-CM

## 2012-04-16 DIAGNOSIS — F329 Major depressive disorder, single episode, unspecified: Secondary | ICD-10-CM

## 2012-04-16 MED ORDER — ROSUVASTATIN CALCIUM 40 MG PO TABS: 40.0000 mg | ORAL_TABLET | Freq: Every day | ORAL | Status: DC

## 2012-04-16 MED ORDER — SERTRALINE HCL 100 MG PO TABS: 100.0000 mg | ORAL_TABLET | Freq: Every day | ORAL | Status: DC

## 2012-04-16 NOTE — Telephone Encounter (Signed)
done

## 2012-04-17 DIAGNOSIS — M545 Low back pain, unspecified: Secondary | ICD-10-CM | POA: Diagnosis not present

## 2012-04-17 DIAGNOSIS — M999 Biomechanical lesion, unspecified: Secondary | ICD-10-CM | POA: Diagnosis not present

## 2012-04-22 DIAGNOSIS — M545 Low back pain, unspecified: Secondary | ICD-10-CM | POA: Diagnosis not present

## 2012-04-22 DIAGNOSIS — M999 Biomechanical lesion, unspecified: Secondary | ICD-10-CM | POA: Diagnosis not present

## 2012-04-23 DIAGNOSIS — M545 Low back pain, unspecified: Secondary | ICD-10-CM | POA: Diagnosis not present

## 2012-04-23 DIAGNOSIS — M999 Biomechanical lesion, unspecified: Secondary | ICD-10-CM | POA: Diagnosis not present

## 2012-04-24 ENCOUNTER — Ambulatory Visit (INDEPENDENT_AMBULATORY_CARE_PROVIDER_SITE_OTHER): Payer: Medicare Other | Admitting: Family Medicine

## 2012-04-24 ENCOUNTER — Encounter: Payer: Self-pay | Admitting: Family Medicine

## 2012-04-24 VITALS — BP 124/80 | HR 68 | Ht 64.24 in | Wt 178.0 lb

## 2012-04-24 DIAGNOSIS — Z01419 Encounter for gynecological examination (general) (routine) without abnormal findings: Secondary | ICD-10-CM

## 2012-04-24 DIAGNOSIS — M999 Biomechanical lesion, unspecified: Secondary | ICD-10-CM | POA: Diagnosis not present

## 2012-04-24 DIAGNOSIS — E782 Mixed hyperlipidemia: Secondary | ICD-10-CM | POA: Diagnosis not present

## 2012-04-24 DIAGNOSIS — R635 Abnormal weight gain: Secondary | ICD-10-CM | POA: Diagnosis not present

## 2012-04-24 DIAGNOSIS — F17200 Nicotine dependence, unspecified, uncomplicated: Secondary | ICD-10-CM | POA: Diagnosis not present

## 2012-04-24 DIAGNOSIS — G250 Essential tremor: Secondary | ICD-10-CM | POA: Diagnosis not present

## 2012-04-24 DIAGNOSIS — Z79899 Other long term (current) drug therapy: Secondary | ICD-10-CM

## 2012-04-24 DIAGNOSIS — G252 Other specified forms of tremor: Secondary | ICD-10-CM | POA: Diagnosis not present

## 2012-04-24 DIAGNOSIS — Z0000 Encounter for general adult medical examination without abnormal findings: Secondary | ICD-10-CM | POA: Diagnosis not present

## 2012-04-24 DIAGNOSIS — M545 Low back pain, unspecified: Secondary | ICD-10-CM | POA: Diagnosis not present

## 2012-04-24 LAB — CBC WITH DIFFERENTIAL/PLATELET
Basophils Absolute: 0.1 10*3/uL (ref 0.0–0.1)
HCT: 41.2 % (ref 36.0–46.0)
Lymphocytes Relative: 35 % (ref 12–46)
Monocytes Absolute: 0.7 10*3/uL (ref 0.1–1.0)
Neutro Abs: 5.1 10*3/uL (ref 1.7–7.7)
Neutrophils Relative %: 54 % (ref 43–77)
RDW: 15 % (ref 11.5–15.5)
WBC: 9.3 10*3/uL (ref 4.0–10.5)

## 2012-04-24 LAB — COMPREHENSIVE METABOLIC PANEL
ALT: 19 U/L (ref 0–35)
AST: 23 U/L (ref 0–37)
Albumin: 4.6 g/dL (ref 3.5–5.2)
BUN: 15 mg/dL (ref 6–23)
Calcium: 10 mg/dL (ref 8.4–10.5)
Chloride: 104 mEq/L (ref 96–112)
Potassium: 4.7 mEq/L (ref 3.5–5.3)
Sodium: 141 mEq/L (ref 135–145)
Total Protein: 7 g/dL (ref 6.0–8.3)

## 2012-04-24 LAB — POCT URINALYSIS DIPSTICK
Ketones, UA: NEGATIVE
Leukocytes, UA: NEGATIVE
Nitrite, UA: NEGATIVE
Protein, UA: NEGATIVE
pH, UA: 5

## 2012-04-24 LAB — TSH: TSH: 1.252 u[IU]/mL (ref 0.350–4.500)

## 2012-04-24 LAB — LIPID PANEL: Total CHOL/HDL Ratio: 2.8 Ratio

## 2012-04-24 MED ORDER — CLONAZEPAM 1 MG PO TABS: 1.0000 mg | ORAL_TABLET | Freq: Three times a day (TID) | ORAL | Status: DC

## 2012-04-24 MED ORDER — NADOLOL 40 MG PO TABS: ORAL_TABLET | ORAL | Status: DC

## 2012-04-24 NOTE — Progress Notes (Signed)
Lindsey Frank is a 66 y.o. female who presents for a complete physical. This is a welcome to Medicare physical. She has the following concerns--she is also here for fasting bloodwork/med check.   She has gained 10 pounds in the last 6 months, 17-20 in the last year.  Reports eating well, but admits to be addicted to chocolate, and admits to eating "way too much"  Tremor--well controlled with nadolol and clonazepam GERD--well controlled with omeprazole.  Denies dysphagia Hyperlipidemia follow-up:  Patient is reportedly following a low-fat, low cholesterol diet.  Compliant with medications and denies medication side effects Depression--well controlled on zoloft. Smoker--tried Chantix in 2010, but had weird hallucinatory dreams.    Others doctors providing care for patient: Dentist (Dr. Adria Devon) Ophtho (Dr. Dagoberto Ligas)  Health Maintenance: Immunization History  Administered Date(s) Administered  . DTaP 04/17/2010  . Pneumococcal Conjugate 04/17/2010  . Td 10/16/2000  . Zoster 03/03/2007  (to be changed in epic--got TdaP 04/16/2010, NOT DTaP) gets flu shots yearly (at drug store) Last Pap smear: 03/2011 Last mammogram: 04/2010 (missed it last year, plans to get) Last colonoscopy: 8/09 Last DEXA: 10/2004 Exercise: walks 0.9 miles 2-3x/week Goes to dentist and ophtho regularly.  Past Medical History  Diagnosis Date  . GERD (gastroesophageal reflux disease)   . Tremor, essential   . Migraine   . Depression   . History of endometriosis   . Elevated cholesterol 01/2005  . Elevated triglycerides with high cholesterol 01/2005  . Elevated C-reactive protein (CRP) 01/2005    Past Surgical History  Procedure Date  . Colonoscopy 05/26/2008  . Uterine fibroid surgery 1977  . Appendectomy 1977  . Laparoscopy 1981    endometriosis  . Abdominal adhesion surgery 1981  . R breast lump 1992    benign  . Tonsilectomy, adenoidectomy, bilateral myringotomy and tubes 1953    age 65  .  Plastic surgery-neck 2008    History   Social History  . Marital Status: Divorced    Spouse Name: N/A    Number of Children: 1  . Years of Education: N/A   Occupational History  . 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 Smoking status: Current Everyday Smoker -- 0.5 packs/day for 30 years    Types: Cigarettes  . Smokeless tobacco: Not on file   Comment: 1/2 ppd  . Alcohol Use: Yes     rarely, 1-2 times per year   . Drug Use: No  . Sexually Active: Not on file   Other Topics Concern  . Not on file   Social History Narrative   Lives alone    Family History  Problem Relation Age of Onset  . Diabetes Mother   . Cancer Father     throat  . Heart disease Father 13  . Stroke Father 4  . Depression Brother     Current outpatient prescriptions:aspirin 81 MG tablet, Take 81 mg by mouth daily.  , Disp: , Rfl: ;  Calcium Carbonate-Vitamin D (CALCIUM + D) 600-200 MG-UNIT TABS, Take 1,200 mg by mouth daily.  , Disp: , Rfl: ;  clonazePAM (KLONOPIN) 1 MG tablet, Take 1 tablet (1 mg total) by mouth 3 (three) times daily. 1/2 tablet in the morning, 1/2 tablet at noon, 1 tablet at bedtime, Disp: 180 tablet, Rfl: 1 nadolol (CORGARD) 40 MG tablet, Take 1 tablet by mouth in the morning, and 2 in the evening, Disp: 270 tablet, Rfl: 1;  Naproxen Sodium (ALEVE) 220  MG CAPS, Take 1 each by mouth as needed.  , Disp: , Rfl: ;  omeprazole (PRILOSEC) 20 MG capsule, Take 20 mg by mouth daily.  , Disp: , Rfl: ;  rosuvastatin (CRESTOR) 40 MG tablet, Take 1 tablet (40 mg total) by mouth daily., Disp: 90 tablet, Rfl: 1 sertraline (ZOLOFT) 100 MG tablet, Take 1 tablet (100 mg total) by mouth daily., Disp: 90 tablet, Rfl: 1  No Known Allergies  ROS:  The patient denies anorexia, fever, headaches,  vision changes, decreased hearing, ear pain, sore throat, breast concerns, chest pain, palpitations, dizziness, syncope, dyspnea on exertion, cough, swelling, nausea, vomiting,  diarrhea, constipation, abdominal pain, melena, hematochezia, indigestion/heartburn, hematuria, incontinence, dysuria, vaginal bleeding, discharge, odor or itch, genital lesions, joint pains, numbness, tingling, weakness, depression, anxiety, abnormal bleeding/bruising, or enlarged lymph nodes.  See HPI.    PHYSICAL EXAM: BP 124/80  Pulse 68  Ht 5' 4.24" (1.632 m)  Wt 178 lb (80.74 kg)  BMI 30.33 kg/m2  General Appearance:    Alert, cooperative, no distress, appears stated age  Head:    Normocephalic, without obvious abnormality, atraumatic  Eyes:    PERRL, conjunctiva/corneas clear, EOM's intact, fundi    benign  Ears:    Normal TM's and external ear canals  Nose:   Nares normal, mucosa normal, no drainage or sinus   tenderness  Throat:   Lips, mucosa, and tongue normal; teeth and gums normal  Neck:   Supple, no lymphadenopathy;  thyroid:  no   enlargement/tenderness/nodules; no carotid   bruit or JVD  Back:    Spine nontender, no curvature, ROM normal, no CVA     tenderness  Lungs:     Clear to auscultation bilaterally without wheezes, rales or     ronchi; respirations unlabored  Chest Wall:    No tenderness or deformity   Heart:    Regular rate and rhythm, S1 and S2 normal, no murmur, rub   or gallop  Breast Exam:    No tenderness, masses, or nipple discharge or inversion.      No axillary lymphadenopathy  Abdomen:     Soft, non-tender, nondistended, normoactive bowel sounds,    no masses, no hepatosplenomegaly  Genitalia:    Normal external genitalia without lesions.  BUS and vagina normal; no cervical motion tenderness. No abnormal vaginal discharge.  Uterus and adnexa not enlarged, nontender, no masses.  Pap not performed  Rectal:    Normal tone, no masses or tenderness; guaiac negative stool  Extremities:   No clubbing, cyanosis or edema  Pulses:   2+ and symmetric all extremities  Skin:   Skin color, texture, turgor normal, no rashes or lesions. 2 nodules L leg (one upper, one  lower) that are somewhat pink, firm   Lymph nodes:   Cervical, supraclavicular, and axillary nodes normal  Neurologic:   CNII-XII intact, normal strength, sensation and gait; reflexes 2+ and symmetric throughout          Psych:   Normal mood, affect, hygiene and grooming.    EKG--sinus brady, rate 56.  No acute abnormalities, slightly poor RWP  ASSESSMENT/PLAN: 1. Routine general medical examination at a health care facility  POCT Urinalysis Dipstick, Visual acuity screening, PR ELECTROCARDIOGRAM, COMPLETE, PR ELECTROCARDIOGRAM, COMPLETE  2. Mixed hyperlipidemia  Lipid panel, Comprehensive metabolic panel  3. Benign essential tremor  nadolol (CORGARD) 40 MG tablet, clonazePAM (KLONOPIN) 1 MG tablet  4. Tobacco use disorder    5. Weight gain  TSH, CBC with Differential  6. Encounter for long-term (current) use of other medications  CBC with Differential, Comprehensive metabolic panel  7. Benign essential tremor  nadolol (CORGARD) 40 MG tablet, clonazePAM (KLONOPIN) 1 MG tablet   controlled with klonipin and nadolol   Hyperlipidemia--expect TG to remain elevated due to her diet.  If so, she prefers dietary trial, given that she is motivated to try and lose the weight she gained.  Skin lesions--?dermatofibroma.  Recommend full skin check once at derm for reassurance.  Weight gain--likely related to diet (chocolate).  Check TSH  QUIT SMOKING--set quit date for 06/24/12 (son's b-day). Counseling given.  Discussed monthly self breast exams and yearly mammograms after the age of 62; at least 30 minutes of aerobic activity at least 5 days/week; proper sunscreen use reviewed; healthy diet, including goals of calcium and vitamin D intake and alcohol recommendations (less than or equal to 1 drink/day) reviewed; regular seatbelt use; changing batteries in smoke detectors.  Immunization recommendations discussed--UTD.  Colonoscopy recommendations reviewed--UTD

## 2012-04-24 NOTE — Patient Instructions (Addendum)
HEALTH MAINTENANCE RECOMMENDATIONS:  It is recommended that you get at least 30 minutes of aerobic exercise at least 5 days/week (for weight loss, you may need as much as 60-90 minutes). This can be any activity that gets your heart rate up. This can be divided in 10-15 minute intervals if needed, but try and build up your endurance at least once a week.  Weight bearing exercise is also recommended twice weekly.  Eat a healthy diet with lots of vegetables, fruits and fiber.  "Colorful" foods have a lot of vitamins (ie green vegetables, tomatoes, red peppers, etc).  Limit sweet tea, regular sodas and alcoholic beverages, all of which has a lot of calories and sugar.  Up to 1 alcoholic drink daily may be beneficial for women (unless trying to lose weight, watch sugars).  Drink a lot of water.  Calcium recommendations are 1200-1500 mg daily (1500 mg for postmenopausal women or women without ovaries), and vitamin D 1000 IU daily.  This should be obtained from diet and/or supplements (vitamins), and calcium should not be taken all at once, but in divided doses.  Monthly self breast exams and yearly mammograms for women over the age of 27 is recommended.  Sunscreen of at least SPF 30 should be used on all sun-exposed parts of the skin when outside between the hours of 10 am and 4 pm (not just when at beach or pool, but even with exercise, golf, tennis, and yard work!)  Use a sunscreen that says "broad spectrum" so it covers both UVA and UVB rays, and make sure to reapply every 1-2 hours.  Remember to change the batteries in your smoke detectors when changing your clock times in the spring and fall.  Use your seat belt every time you are in a car, and please drive safely and not be distracted with cell phones and texting while driving.  PLEASE QUIT SMOKING!  Set a quit date.  Don't forget about free classes at [facility], 1800-QUITNOW and [facility] for free counseling.

## 2012-04-25 ENCOUNTER — Encounter: Payer: Self-pay | Admitting: Family Medicine

## 2012-05-01 ENCOUNTER — Encounter: Payer: Self-pay | Admitting: Family Medicine

## 2012-05-01 DIAGNOSIS — M545 Low back pain, unspecified: Secondary | ICD-10-CM | POA: Diagnosis not present

## 2012-05-01 DIAGNOSIS — M999 Biomechanical lesion, unspecified: Secondary | ICD-10-CM | POA: Diagnosis not present

## 2012-05-08 DIAGNOSIS — M999 Biomechanical lesion, unspecified: Secondary | ICD-10-CM | POA: Diagnosis not present

## 2012-05-08 DIAGNOSIS — M545 Low back pain, unspecified: Secondary | ICD-10-CM | POA: Diagnosis not present

## 2012-05-10 DIAGNOSIS — M999 Biomechanical lesion, unspecified: Secondary | ICD-10-CM | POA: Diagnosis not present

## 2012-05-10 DIAGNOSIS — M545 Low back pain, unspecified: Secondary | ICD-10-CM | POA: Diagnosis not present

## 2012-05-13 DIAGNOSIS — M545 Low back pain, unspecified: Secondary | ICD-10-CM | POA: Diagnosis not present

## 2012-05-13 DIAGNOSIS — M999 Biomechanical lesion, unspecified: Secondary | ICD-10-CM | POA: Diagnosis not present

## 2012-05-15 DIAGNOSIS — M545 Low back pain, unspecified: Secondary | ICD-10-CM | POA: Diagnosis not present

## 2012-05-15 DIAGNOSIS — M999 Biomechanical lesion, unspecified: Secondary | ICD-10-CM | POA: Diagnosis not present

## 2012-06-13 DIAGNOSIS — M999 Biomechanical lesion, unspecified: Secondary | ICD-10-CM | POA: Diagnosis not present

## 2012-06-13 DIAGNOSIS — M545 Low back pain, unspecified: Secondary | ICD-10-CM | POA: Diagnosis not present

## 2012-06-18 DIAGNOSIS — M545 Low back pain, unspecified: Secondary | ICD-10-CM | POA: Diagnosis not present

## 2012-06-18 DIAGNOSIS — M999 Biomechanical lesion, unspecified: Secondary | ICD-10-CM | POA: Diagnosis not present

## 2012-06-20 DIAGNOSIS — M999 Biomechanical lesion, unspecified: Secondary | ICD-10-CM | POA: Diagnosis not present

## 2012-06-20 DIAGNOSIS — M545 Low back pain, unspecified: Secondary | ICD-10-CM | POA: Diagnosis not present

## 2012-06-24 DIAGNOSIS — M999 Biomechanical lesion, unspecified: Secondary | ICD-10-CM | POA: Diagnosis not present

## 2012-06-24 DIAGNOSIS — M545 Low back pain, unspecified: Secondary | ICD-10-CM | POA: Diagnosis not present

## 2012-06-25 DIAGNOSIS — M545 Low back pain, unspecified: Secondary | ICD-10-CM | POA: Diagnosis not present

## 2012-06-25 DIAGNOSIS — M999 Biomechanical lesion, unspecified: Secondary | ICD-10-CM | POA: Diagnosis not present

## 2012-06-27 DIAGNOSIS — M545 Low back pain, unspecified: Secondary | ICD-10-CM | POA: Diagnosis not present

## 2012-06-27 DIAGNOSIS — M999 Biomechanical lesion, unspecified: Secondary | ICD-10-CM | POA: Diagnosis not present

## 2012-07-02 DIAGNOSIS — M545 Low back pain, unspecified: Secondary | ICD-10-CM | POA: Diagnosis not present

## 2012-07-02 DIAGNOSIS — M999 Biomechanical lesion, unspecified: Secondary | ICD-10-CM | POA: Diagnosis not present

## 2012-07-10 DIAGNOSIS — M545 Low back pain, unspecified: Secondary | ICD-10-CM | POA: Diagnosis not present

## 2012-07-10 DIAGNOSIS — M999 Biomechanical lesion, unspecified: Secondary | ICD-10-CM | POA: Diagnosis not present

## 2012-07-17 DIAGNOSIS — M999 Biomechanical lesion, unspecified: Secondary | ICD-10-CM | POA: Diagnosis not present

## 2012-07-17 DIAGNOSIS — M545 Low back pain, unspecified: Secondary | ICD-10-CM | POA: Diagnosis not present

## 2012-08-07 DIAGNOSIS — M545 Low back pain, unspecified: Secondary | ICD-10-CM | POA: Diagnosis not present

## 2012-08-07 DIAGNOSIS — M999 Biomechanical lesion, unspecified: Secondary | ICD-10-CM | POA: Diagnosis not present

## 2012-08-09 DIAGNOSIS — H908 Mixed conductive and sensorineural hearing loss, unspecified: Secondary | ICD-10-CM | POA: Diagnosis not present

## 2012-08-09 DIAGNOSIS — H6120 Impacted cerumen, unspecified ear: Secondary | ICD-10-CM | POA: Diagnosis not present

## 2012-08-09 DIAGNOSIS — Z23 Encounter for immunization: Secondary | ICD-10-CM | POA: Diagnosis not present

## 2012-11-04 ENCOUNTER — Telehealth: Payer: Self-pay | Admitting: Internal Medicine

## 2012-11-04 NOTE — Telephone Encounter (Signed)
Also needs a refill for nadolol 40mg  #270

## 2012-11-05 ENCOUNTER — Telehealth: Payer: Self-pay | Admitting: Internal Medicine

## 2012-11-05 ENCOUNTER — Other Ambulatory Visit: Payer: Self-pay | Admitting: *Deleted

## 2012-11-05 DIAGNOSIS — G250 Essential tremor: Secondary | ICD-10-CM

## 2012-11-05 MED ORDER — NADOLOL 40 MG PO TABS: ORAL_TABLET | ORAL | Status: DC

## 2012-11-05 NOTE — Telephone Encounter (Signed)
Done

## 2012-11-06 ENCOUNTER — Other Ambulatory Visit: Payer: Self-pay | Admitting: *Deleted

## 2012-11-06 DIAGNOSIS — F329 Major depressive disorder, single episode, unspecified: Secondary | ICD-10-CM

## 2012-11-06 DIAGNOSIS — G250 Essential tremor: Secondary | ICD-10-CM

## 2012-11-06 DIAGNOSIS — F3289 Other specified depressive episodes: Secondary | ICD-10-CM

## 2012-11-06 DIAGNOSIS — E782 Mixed hyperlipidemia: Secondary | ICD-10-CM

## 2012-11-06 MED ORDER — CLONAZEPAM 1 MG PO TABS: 1.0000 mg | ORAL_TABLET | Freq: Three times a day (TID) | ORAL | Status: DC

## 2012-11-06 MED ORDER — ROSUVASTATIN CALCIUM 40 MG PO TABS: 40.0000 mg | ORAL_TABLET | Freq: Every day | ORAL | Status: DC

## 2012-11-06 MED ORDER — SERTRALINE HCL 100 MG PO TABS: 100.0000 mg | ORAL_TABLET | Freq: Every day | ORAL | Status: DC

## 2012-11-06 NOTE — Telephone Encounter (Signed)
Refill request for clonazepam 1mg  #180 to [facility]

## 2012-11-06 NOTE — Telephone Encounter (Signed)
Patient did schedule a med check for next Wednesday. I refilled her other meds but need to know if I am okay to call in the clonazepam.

## 2012-11-06 NOTE — Telephone Encounter (Signed)
Ok to refill the clonazepam

## 2012-11-06 NOTE — Telephone Encounter (Signed)
Done

## 2012-11-13 ENCOUNTER — Encounter: Payer: Self-pay | Admitting: Family Medicine

## 2012-11-13 ENCOUNTER — Ambulatory Visit (INDEPENDENT_AMBULATORY_CARE_PROVIDER_SITE_OTHER): Payer: Medicare Other | Admitting: Family Medicine

## 2012-11-13 VITALS — BP 116/80 | HR 60 | Ht 64.25 in | Wt 174.0 lb

## 2012-11-13 DIAGNOSIS — F329 Major depressive disorder, single episode, unspecified: Secondary | ICD-10-CM | POA: Diagnosis not present

## 2012-11-13 DIAGNOSIS — G250 Essential tremor: Secondary | ICD-10-CM | POA: Diagnosis not present

## 2012-11-13 DIAGNOSIS — F3289 Other specified depressive episodes: Secondary | ICD-10-CM

## 2012-11-13 DIAGNOSIS — E782 Mixed hyperlipidemia: Secondary | ICD-10-CM | POA: Diagnosis not present

## 2012-11-13 DIAGNOSIS — F17200 Nicotine dependence, unspecified, uncomplicated: Secondary | ICD-10-CM

## 2012-11-13 DIAGNOSIS — G252 Other specified forms of tremor: Secondary | ICD-10-CM

## 2012-11-13 DIAGNOSIS — G4700 Insomnia, unspecified: Secondary | ICD-10-CM

## 2012-11-13 NOTE — Patient Instructions (Addendum)
Keep cutting back on smoking--try quitting by your birthday!  Use Tylenol PM (or other diphenhydramine) as needed at bedtime; consider melatonin. Consider trying to take the clonazepam a little later in the afternoon (3 or 4) if still having trouble falling asleep; I don't recommend taking an additional bedtime dose.

## 2012-11-13 NOTE — Progress Notes (Signed)
Chief Complaint  Patient presents with  . Med check    nonfastng med check.   Patient presents for follow up on her chronic medical problems.  She had meds refilled for 90 days last week.  Tremor--had been well controlled with nadolol and clonazepam. She is asking about increasing clonazepam to TID, wanting to take an additional pill at bedtime, to help her sleep.  Not bothered by tremors at night, but just wanting to sleep.  Takes the clonazepam at 7:30 am, and 1pm.  Before taking second dose, tremor was bad again by 4-5pm.  She has tried Tylenol PM, which sometimes works (often does but not always).  Doesn't have trouble falling asleep every night.  Sleep has been a little better since she has been doing the seated [employer]ne[employer] program.  GERD--well controlled with omeprazole. Denies dysphagia.   Hyperlipidemia follow-up: Patient is reportedly following a low-fat, low cholesterol diet. Compliant with medications and denies medication side effects.  Last lipids reviewed and have remained fairly stable over the last 3 checks.  TG's have been borderline, but last check was <200 (190's).  LDL has been at goal.  Depression--well controlled on zoloft.   Smoker--tried Chantix in 2010, but had weird hallucinatory dreams. She has been cutting back slowly, in effort to quit.  She can feel herself breathing better with exercise since cutting back on smoking  Past Medical History  Diagnosis Date  . GERD (gastroesophageal reflux disease)   . Tremor, essential   . Migraine   . Depression   . History of endometriosis   . Elevated cholesterol 01/2005  . Elevated triglycerides with high cholesterol 01/2005  . Elevated C-reactive protein (CRP) 01/2005   Past Surgical History  Procedure Date  . Colonoscopy 05/26/2008  . Uterine fibroid surgery 1977  . Appendectomy 1977  . Laparoscopy 1981    endometriosis  . Abdominal adhesion surgery 1981  . R breast lump 1992    benign  . Tonsilectomy,  adenoidectomy, bilateral myringotomy and tubes 1953    age 67  . Plastic surgery-neck 2008   History   Social History  . Marital Status: Divorced    Spouse Name: N/A    Number of Children: 66  . Years of Education: N/A   Occupational History  . 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 Smoking status: Current Every Day Smoker -- 0.5 packs/day for 30 years    Types: Cigarettes  . Smokeless tobacco: Not on file     Comment: 1/2 ppd  . Alcohol Use: Yes     Comment: rarely, 1-2 times per year   . Drug Use: No  . Sexually Active: Not on file   Other Topics Concern  . Not on file   Social History Narrative   Lives alone   Current outpatient prescriptions:aspirin 81 MG tablet, Take 81 mg by mouth daily.  , Disp: , Rfl: ;  Calcium Carbonate-Vitamin D (CALCIUM + D) 600-200 MG-UNIT TABS, Take 1,200 mg by mouth daily.  , Disp: , Rfl: ;  clonazePAM (KLONOPIN) 1 MG tablet, Take 1 tablet (1 mg total) by mouth 3 (three) times daily. 1/2 tablet in the morning, 1/2 tablet at noon, 1 tablet at bedtime, Disp: 180 tablet, Rfl: 0 nadolol (CORGARD) 40 MG tablet, Take 1 tablet by mouth in the morning, and 2 in the evening, Disp: 270 tablet, Rfl: 0;  omeprazole (PRILOSEC) 20 MG capsule, Take 20 mg by mouth daily.  ,  Disp: , Rfl: ;  rosuvastatin (CRESTOR) 40 MG tablet, Take 1 tablet (40 mg total) by mouth daily., Disp: 90 tablet, Rfl: 0;  sertraline (ZOLOFT) 100 MG tablet, Take 1 tablet (100 mg total) by mouth daily., Disp: 90 tablet, Rfl: 0 Naproxen Sodium (ALEVE) 220 MG CAPS, Take 1 each by mouth as needed.  , Disp: , Rfl:   No Known Allergies  ROS:  Denies fevers, URI symptoms, cough, shortness of breath, chest pain, palpitations.  Denies nausea, vomiting, dysphagia, bowel changes, abdominal pain, urinary complaints, bleeding/bruising, headaches, dizziness, myalgias or joint pains.  Denies worsening depression. +intermittent insomnia.  See HPI.  PHYSICAL EXAM: BP 116/80   Pulse 60  Ht 5' 4.25" (1.632 m)  Wt 174 lb (78.926 kg)  BMI 29.64 kg/m2 Well developed, pleasant female in no distress Neck: no lymphadenopathy, thyromegaly or mass Heart: regular rate and rhythm without murmur  Lungs: clear bilaterally  Abdomen: soft, nontender, no organomegaly or mass  Extremities: no edema  Skin: no rash Psych: normal mood, affect, hygiene and grooming  Neuro: alert, oriented, minimal tremor  ASSESMENT/PLAN:  1. Benign essential tremor   2. Tobacco use disorder   3. Mixed hyperlipidemia   4. Depressive disorder, not elsewhere classified   5. Insomnia    Advised to try taking afternoon dose of clonazepam at 3-4 pm rather than at 1, to see if that helps her more with sleep in the evening. Discussed that clonazepam should only be BID, not TID, and I don't want to increase dose. Continue daily exercise, which will help with sleep.  Use Tylenol PM (or other diphenhydramine) as needed at bedtime; consider melatonin.  Smoking--congratulated on cutting back.  Encouraged to set a quit date (?birthday)  Lipids--LDL controlled, TG borderline high per last lipids in July.  Reminded to follow lowfat diet, limit sweets.  All meds refilled for 90 days last week.  Will refill for another 90 days when needed.  Return July for fasting med check PLUS/AWV

## 2013-02-05 ENCOUNTER — Telehealth: Payer: Self-pay | Admitting: Internal Medicine

## 2013-02-05 DIAGNOSIS — G250 Essential tremor: Secondary | ICD-10-CM

## 2013-02-05 DIAGNOSIS — E782 Mixed hyperlipidemia: Secondary | ICD-10-CM

## 2013-02-05 DIAGNOSIS — F3289 Other specified depressive episodes: Secondary | ICD-10-CM

## 2013-02-05 DIAGNOSIS — F329 Major depressive disorder, single episode, unspecified: Secondary | ICD-10-CM

## 2013-02-05 MED ORDER — SERTRALINE HCL 100 MG PO TABS: 100.0000 mg | ORAL_TABLET | Freq: Every day | ORAL | Status: DC

## 2013-02-05 MED ORDER — NADOLOL 40 MG PO TABS: ORAL_TABLET | ORAL | Status: DC

## 2013-02-05 MED ORDER — CLONAZEPAM 1 MG PO TABS: 1.0000 mg | ORAL_TABLET | Freq: Three times a day (TID) | ORAL | Status: DC

## 2013-02-05 MED ORDER — ROSUVASTATIN CALCIUM 40 MG PO TABS: 40.0000 mg | ORAL_TABLET | Freq: Every day | ORAL | Status: DC

## 2013-02-05 NOTE — Telephone Encounter (Signed)
Also a request for crestor 40mg  #90, sertaline 100mg  #90

## 2013-02-05 NOTE — Telephone Encounter (Signed)
Okay to refill all meds for 90 days

## 2013-05-05 ENCOUNTER — Other Ambulatory Visit: Payer: Self-pay | Admitting: Family Medicine

## 2013-05-06 ENCOUNTER — Other Ambulatory Visit: Payer: Self-pay | Admitting: Family Medicine

## 2013-05-06 NOTE — Telephone Encounter (Signed)
IS THIS OK 

## 2013-05-06 NOTE — Telephone Encounter (Signed)
Ok to refill.  (has appt 7/30)

## 2013-05-14 ENCOUNTER — Encounter: Payer: Self-pay | Admitting: Family Medicine

## 2013-05-14 ENCOUNTER — Ambulatory Visit (INDEPENDENT_AMBULATORY_CARE_PROVIDER_SITE_OTHER): Payer: Medicare Other | Admitting: Family Medicine

## 2013-05-14 VITALS — BP 120/78 | HR 72 | Ht 64.25 in | Wt 178.0 lb

## 2013-05-14 DIAGNOSIS — K219 Gastro-esophageal reflux disease without esophagitis: Secondary | ICD-10-CM | POA: Diagnosis not present

## 2013-05-14 DIAGNOSIS — E782 Mixed hyperlipidemia: Secondary | ICD-10-CM

## 2013-05-14 DIAGNOSIS — G250 Essential tremor: Secondary | ICD-10-CM

## 2013-05-14 DIAGNOSIS — Z79899 Other long term (current) drug therapy: Secondary | ICD-10-CM

## 2013-05-14 DIAGNOSIS — G252 Other specified forms of tremor: Secondary | ICD-10-CM

## 2013-05-14 DIAGNOSIS — F17200 Nicotine dependence, unspecified, uncomplicated: Secondary | ICD-10-CM

## 2013-05-14 DIAGNOSIS — F3289 Other specified depressive episodes: Secondary | ICD-10-CM

## 2013-05-14 DIAGNOSIS — G43909 Migraine, unspecified, not intractable, without status migrainosus: Secondary | ICD-10-CM

## 2013-05-14 DIAGNOSIS — F329 Major depressive disorder, single episode, unspecified: Secondary | ICD-10-CM

## 2013-05-14 DIAGNOSIS — Z0000 Encounter for general adult medical examination without abnormal findings: Secondary | ICD-10-CM

## 2013-05-14 LAB — COMPREHENSIVE METABOLIC PANEL
ALT: 17 U/L (ref 0–35)
AST: 25 U/L (ref 0–37)
Albumin: 4.8 g/dL (ref 3.5–5.2)
Calcium: 10.1 mg/dL (ref 8.4–10.5)
Chloride: 103 mEq/L (ref 96–112)
Creat: 0.88 mg/dL (ref 0.50–1.10)
Potassium: 4.4 mEq/L (ref 3.5–5.3)

## 2013-05-14 LAB — HEMOCCULT GUIAC POC 1CARD (OFFICE)

## 2013-05-14 LAB — LIPID PANEL
LDL Cholesterol: 72 mg/dL (ref 0–99)
VLDL: 62 mg/dL — ABNORMAL HIGH (ref 0–40)

## 2013-05-14 NOTE — Patient Instructions (Signed)
HEALTH MAINTENANCE RECOMMENDATIONS:  It is recommended that you get at least 30 minutes of aerobic exercise at least 5 days/week (for weight loss, you may need as much as 60-90 minutes). This can be any activity that gets your heart rate up. This can be divided in 10-15 minute intervals if needed, but try and build up your endurance at least once a week.  Weight bearing exercise is also recommended twice weekly.  Eat a healthy diet with lots of vegetables, fruits and fiber.  "Colorful" foods have a lot of vitamins (ie green vegetables, tomatoes, red peppers, etc).  Limit sweet tea, regular sodas and alcoholic beverages, all of which has a lot of calories and sugar.  Up to 1 alcoholic drink daily may be beneficial for women (unless trying to lose weight, watch sugars).  Drink a lot of water.  Calcium recommendations are 1200-1500 mg daily (1500 mg for postmenopausal women or women without ovaries), and vitamin D 1000 IU daily.  This should be obtained from diet and/or supplements (vitamins), and calcium should not be taken all at once, but in divided doses.  Monthly self breast exams and yearly mammograms for women over the age of 85 is recommended.  Sunscreen of at least SPF 30 should be used on all sun-exposed parts of the skin when outside between the hours of 10 am and 4 pm (not just when at beach or pool, but even with exercise, golf, tennis, and yard work!)  Use a sunscreen that says "broad spectrum" so it covers both UVA and UVB rays, and make sure to reapply every 1-2 hours.  Remember to change the batteries in your smoke detectors when changing your clock times in the spring and fall.  Use your seat belt every time you are in a car, and please drive safely and not be distracted with cell phones and texting while driving.  PLEASE SCHEDULE YOUR MAMMOGRAM!  You are long past due in getting this.  I also recommend that you have a bone density test done.  You were given a written prescription that  should serve as an "order" for [facility].  You can schedule it for same time as mammogram, if you like--you just need to call and set up the appointments.  Please try and quit smoking--start thinking about why/when you smoke (habit, boredom, stress) in order to come up with effective strategies to cut back or quit. Available resources to help you quit include free counseling through [facility] ([facility]uit[facility] or 1-800-QUITNOW), smoking cessation classes through [facility] (call to find out schedule), over-the-counter nicotine replacements, and e-cigarettes (although this may not help break the hand-mouth habit).  Many insurance companies also have smoking cessation programs (which may decrease the cost of patches, meds if enrolled).  If these methods are not effective for you, and you are motivated to quit, return to discuss the possibility of prescription medications.

## 2013-05-14 NOTE — Progress Notes (Signed)
Chief Complaint  Patient presents with  . Med check plus    fasting med check/AWV-no complaints.   Patient presents for a med check, as well as Annual Wellness Visit.  Tremor--well controlled with nadolol and clonazepam. Taking 1 BID of the clonazepam (previously took 1/2 in am, 1/2 at noon, 1 at bedtime)--didn't like cutting, they crumbled.  It is effective using it this way, without sedation.    GERD--well controlled with omeprazole. Denies dysphagia.  Never misses a pill to know if she has recurrent symptoms.  Hyperlipidemia follow-up: Patient is reportedly following a low-fat, low cholesterol diet. Compliant with medications and denies medication side effects   Depression--well controlled on zoloft.   Has gone off meds in the past, and had recurrence of depression, some of which patient relates to work stress.  She denies any side effects of medication.  Smoker--tried Chantix in 2010, but had weird hallucinatory dreams. She has been cutting back, and plans to quit by her son's birthday in September (didn't not meet previous quit dates, but has cut back further)  All meds were refilled for 90 days last week.  AWV: Others doctors providing care for patient:  Dentist (Dr. Adria Devon)  Ophtho (Dr. Dagoberto Ligas) GI (Dr. Madilyn Fireman at [facility], 2009)  End of Life issues:  She already has Living Will and healthcare power of attorney  Depression screen:  Normal--see scanned form ADL screen--normal.  See scanned form  Immunization History  Administered Date(s) Administered  . Influenza Split 08/16/2012  . Pneumococcal Conjugate 04/17/2010  . Td 10/16/2000  . Tdap 04/17/2010  . Zoster 03/03/2007  Last Pap smear: 03/2011  Last mammogram: 04/2010--she never went last year after discussion at her physical Last colonoscopy: 8/09  Last DEXA: 10/2004  Exercise: walks 3-4/week plus [employer] class 2x/week Goes to dentist every 6 months ophtho every 2 years, went last year.  Past Medical  History  Diagnosis Date  . GERD (gastroesophageal reflux disease)   . Tremor, essential   . Migraine   . Depression   . History of endometriosis   . Elevated cholesterol 01/2005  . Elevated triglycerides with high cholesterol 01/2005  . Elevated C-reactive protein (CRP) 01/2005    Past Surgical History  Procedure Laterality Date  . Colonoscopy  05/26/2008  . Uterine fibroid surgery  1977  . Appendectomy  1977  . Laparoscopy  1981    endometriosis  . Abdominal adhesion surgery  1981  . R breast lump  1992    benign  . Tonsilectomy, adenoidectomy, bilateral myringotomy and tubes  1953    age 32  . Plastic surgery-neck  2008    History   Social History  . Marital Status: Divorced    Spouse Name: N/A    Number of Children: 1  . Years of Education: N/A   Occupational History  . 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 Smoking status: Current Every Day Smoker -- 0.25 packs/day for 30 years    Types: Cigarettes  . Smokeless tobacco: Never Used     Comment: 3-5 cigarettes/day, cutting back  . Alcohol Use: Yes     Comment: rarely, 1-2 times per year   . Drug Use: No  . Sexually Active: Not Currently   Other Topics Concern  . Not on file   Social History Narrative   Lives alone.  Son lives in [place], 4 grandchildren    Family History  Problem Relation Age of Onset  .  Diabetes Mother   . Cancer Father     throat  . Heart disease Father 42  . Stroke Father 72  . Depression Brother     Current outpatient prescriptions:aspirin 81 MG tablet, Take 81 mg by mouth daily.  , Disp: , Rfl: ;  Calcium Carbonate-Vitamin D (CALCIUM + D) 600-200 MG-UNIT TABS, Take 1,200 mg by mouth daily.  , Disp: , Rfl: ;  clonazePAM (KLONOPIN) 1 MG tablet, TAKE 1 TABLET BY MOUTH THREE TIMES DAILY( 1/2 TABLET IN THE MORNING, 1/2 TABLET AT NOON, THEN 1 TABLET EVERY NIGHT AT BEDTIME), Disp: 180 tablet, Rfl: 0 CRESTOR 40 MG tablet, TAKE 1 TABLET BY MOUTH ONCE DAILY,  Disp: 90 tablet, Rfl: 0;  nadolol (CORGARD) 40 MG tablet, TAKE 1 TABLET BY MOUTH EVERY MORNING, THEN 2 TABLETS IN THE EVENING, Disp: 270 tablet, Rfl: 0;  omeprazole (PRILOSEC) 20 MG capsule, Take 20 mg by mouth daily.  , Disp: , Rfl: ;  sertraline (ZOLOFT) 100 MG tablet, TAKE 1 TABLET BY MOUTH DAILY, Disp: 90 tablet, Rfl: 0 Naproxen Sodium (ALEVE) 220 MG CAPS, Take 1 each by mouth as needed.  , Disp: , Rfl:   No Known Allergies  ROS: The patient denies anorexia, fever, headaches, vision changes, decreased hearing, ear pain, sore throat, breast concerns, chest pain, palpitations, dizziness, syncope, dyspnea on exertion, cough, swelling, nausea, vomiting, diarrhea, constipation, abdominal pain, melena, hematochezia, indigestion/heartburn, hematuria, incontinence, dysuria, vaginal bleeding, discharge, odor or itch, genital lesions, joint pains, numbness, tingling, weakness, depression, anxiety, abnormal bleeding/bruising, or enlarged lymph nodes. See HPI Some chronic mild postnasal drip.  No sinus pain or congestion.  PHYSICAL EXAM:  BP 120/78  Pulse 72  Ht 5' 4.25" (1.632 m)  Wt 178 lb (80.74 kg)  BMI 30.31 kg/m2  General Appearance:  Alert, cooperative, no distress, appears stated age   Head:  Normocephalic, without obvious abnormality, atraumatic   Eyes:  PERRL, conjunctiva/corneas clear, EOM's intact, fundi  benign   Ears:  Normal TM's and external ear canals   Nose:  Nares normal, mucosa normal, no drainage or sinus tenderness   Throat:  Lips, mucosa, and tongue normal; teeth and gums normal. Mild cobblestoning posteriorly  Neck:  Supple, no lymphadenopathy; thyroid: no enlargement/tenderness/nodules; no carotid  bruit or JVD   Back:  Spine nontender, no curvature, ROM normal, no CVA tenderness   Lungs:  Clear to auscultation bilaterally without wheezes, rales or ronchi; respirations unlabored   Chest Wall:  No tenderness or deformity   Heart:  Regular rate and rhythm, S1 and S2 normal,  no murmur, rub  or gallop   Breast Exam:  No tenderness, masses, or nipple discharge or inversion. No axillary lymphadenopathy   Abdomen:  Soft, non-tender, nondistended, normoactive bowel sounds,  no masses, no hepatosplenomegaly   Genitalia:  Normal external genitalia without lesions. BUS and vagina normal; no cervical motion tenderness. No abnormal vaginal discharge. Uterus and adnexa not enlarged, nontender, no masses. Pap not performed   Rectal:  Normal tone, no masses or tenderness; guaiac negative stool   Extremities:  No clubbing, cyanosis or edema   Pulses:  2+ and symmetric all extremities   Skin:  Skin color, texture, turgor normal, no rashes or lesions. 2 nodules L leg (one upper/lateral, measuring 5mm, one medially on lower portion of upper left leg, measuring 3-58mm) that are somewhat pink, firm   Lymph nodes:  Cervical, supraclavicular, and axillary nodes normal   Neurologic:  CNII-XII intact, normal strength, sensation and gait; reflexes  2+ and symmetric throughout          Psych: Normal mood, affect, hygiene and grooming.   ASSESSMENT/PLAN:  Mixed hyperlipidemia - Plan: Lipid panel, Comprehensive metabolic panel, TSH  Benign essential tremor - controlled with nadolol and clonazepam  GERD (gastroesophageal reflux disease)  Encounter for long-term (current) use of other medications - Plan: Lipid panel, Comprehensive metabolic panel, TSH  Depressive disorder, not elsewhere classified - well controlled  Tobacco use disorder  Migraine - on nadolol for tremor and migraine prevention.  Not having any migraines  Medicare annual wellness visit, initial  Depression:  Discussed potential to try decreasing dose to 50mg  (with potential to taper off, if doing well). She has had recurrent episodes of depression after coming off in past, but she thinks related to work stress. She is now retired, but declines attempting to lower dose.  Advised that she can cut tablet in 1/2 at any  time, but to increase back to full tablet if having any recurrent symptoms.  She declines any dose change currently.  GERD--discussed that her symptoms of reflux might improve as she is cutting back/quitting smoking. She can try to cut back (qod, then taper off, using when having known foods that trigger reflux).  Discussed risks of untreated reflux and longterm PPI use.  Skin lesions--dermatofibroma vs SCC.  She plans to schedule appt with derm (but said that last year and hasn't gone).  Pt denies any changes in size/color.  Measured today.  Consider excising here if any change (rather than waiting for her to see derm)  Discussed monthly self breast exams and yearly mammograms after the age of 59; at least 30 minutes of aerobic activity at least 5 days/week; proper sunscreen use reviewed; healthy diet, including goals of calcium and vitamin D intake and alcohol recommendations (less than or equal to 1 drink/day) reviewed; regular seatbelt use; changing batteries in smoke detectors. Immunization recommendations discussed--UTD.  She had pneumovax prior to age of 23 due to her being a smoker.  Recommend repeat in 04/2015 (5 years from last). Colonoscopy recommendations reviewed--UTD  Past due for mammogram.  Recommended she schedule mammogram and DEXA (rx given for [facility])  End of Life discussion:  She already has Living Will and healthcare power of attorney.  MOST form filled out.    F/u 6 month med check, sooner prn

## 2013-08-03 ENCOUNTER — Other Ambulatory Visit: Payer: Self-pay | Admitting: Family Medicine

## 2013-08-04 ENCOUNTER — Other Ambulatory Visit: Payer: Self-pay | Admitting: Family Medicine

## 2013-08-04 DIAGNOSIS — Z23 Encounter for immunization: Secondary | ICD-10-CM | POA: Diagnosis not present

## 2013-08-04 NOTE — Telephone Encounter (Signed)
Called in med to pharmacy  

## 2013-08-04 NOTE — Telephone Encounter (Signed)
Yes, ok to refill #180, no add'l refill

## 2013-08-04 NOTE — Telephone Encounter (Signed)
Is this okay to fill? 

## 2013-08-11 ENCOUNTER — Other Ambulatory Visit: Payer: Self-pay | Admitting: Family Medicine

## 2013-10-19 ENCOUNTER — Other Ambulatory Visit: Payer: Self-pay | Admitting: Family Medicine

## 2013-10-20 NOTE — Telephone Encounter (Signed)
Is the klonopin ok to refill?

## 2013-10-20 NOTE — Telephone Encounter (Signed)
Ok to refill.  Has appt scheduled

## 2013-10-22 ENCOUNTER — Ambulatory Visit (INDEPENDENT_AMBULATORY_CARE_PROVIDER_SITE_OTHER): Payer: Medicare Other | Admitting: Family Medicine

## 2013-10-22 ENCOUNTER — Encounter: Payer: Self-pay | Admitting: Family Medicine

## 2013-10-22 VITALS — BP 120/70 | HR 60 | Temp 97.9°F | Ht 64.25 in | Wt 177.0 lb

## 2013-10-22 DIAGNOSIS — F17200 Nicotine dependence, unspecified, uncomplicated: Secondary | ICD-10-CM

## 2013-10-22 DIAGNOSIS — J209 Acute bronchitis, unspecified: Secondary | ICD-10-CM

## 2013-10-22 MED ORDER — AMOXICILLIN 875 MG PO TABS: 875.0000 mg | ORAL_TABLET | Freq: Two times a day (BID) | ORAL | Status: DC

## 2013-10-22 NOTE — Patient Instructions (Signed)
Take the antibiotics as directed (twice daily for 10 days). Continue guaifenesin (mucinex)--take as directed. This will keep the phlegm loose  You should drink plenty of fluids. You can try taking dextromethorphan as a cough suppressant (this is in DM versions of guaifenesin-containing medications--ie Robitussin DM or Mucinex DM, or can be found separately in Delsym syrup). Sleep with the head of the bed elevated. Consider doing sinus rinses (sinus rinse kit or neti-pot) prior to bed.  Please do NOT restart smoking cigarettes

## 2013-10-22 NOTE — Progress Notes (Signed)
Chief Complaint  Patient presents with  . Nasal Congestion    and cough, runny nose, congestion B/L ear pain and stuffiness, left worse than right. No fever, vomiting but has had diarrhea. These symptoms have been going on for 10 days. Started with ST and runny nose, head congestion that has now moved down in to her chest.  Has tried both mucinex and nyquil, did not really help.     She had slight sore throat and runny nose the day she left [place]--her daughter-in-law was sick with similar illness (and all grandchildren were sick), and then she was on plane.  Has been sick ever since returning.  Symptoms have persisted with congestion, ear pain, chest congestion.  No longer having sore throat (unless coughing).  Cough is worse when she lies down.  Nasal mucus has been bloody for the last 2 mornings.  Phlegm has been dark yellow.  Denies shortness of breath, fevers.  She is having plugging and decreased hearing in the left ear.  Not painful.  She had the same in her right ear, but got better after it popped.  She had just 1 episode of diarrhea yesterday or the day before.  Denies nausea or vomiting.  Denies fevers or body aches.  She hasn't been smoking since she has been sick--no cigarette in the last 10 days.  She started taking Nyquil, which helped her sleep. She then changed to Mucinex (generic, plain) but stopped because didn't notice any improvement.  The cough when supine got worse after stopping the Mucinex; she has been sleeping sitting up.  She took Aleve a couple of times during the illness as well, no other OTC meds.  Past Medical History  Diagnosis Date  . GERD (gastroesophageal reflux disease)   . Tremor, essential   . Migraine   . Depression   . History of endometriosis   . Elevated cholesterol 01/2005  . Elevated triglycerides with high cholesterol 01/2005  . Elevated C-reactive protein (CRP) 01/2005   Past Surgical History  Procedure Laterality Date  . Colonoscopy  05/26/2008   . Uterine fibroid surgery  1977  . Appendectomy  1977  . Laparoscopy  1981    endometriosis  . Abdominal adhesion surgery  1981  . R breast lump  1992    benign  . Tonsilectomy, adenoidectomy, bilateral myringotomy and tubes  1953    age 68  . Plastic surgery-neck  2008   History   Social History  . Marital Status: Divorced    Spouse Name: N/A    Number of Children: 1  . Years of Education: N/A   Occupational History  . 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 Smoking status: Current Every Day Smoker -- 0.25 packs/day for 30 years    Types: Cigarettes  . Smokeless tobacco: Never Used     Comment: 3-5 cigarettes/day, cutting back  . Alcohol Use: Yes     Comment: rarely, 1-2 times per year   . Drug Use: No  . Sexual Activity: Not Currently   Other Topics Concern  . Not on file   Social History Narrative   Lives alone.  Son lives in [place], 4 grandchildren   Current Outpatient Prescriptions on File Prior to Visit  Medication Sig Dispense Refill  . aspirin 81 MG tablet Take 81 mg by mouth daily.        . Calcium Carbonate-Vitamin D (CALCIUM + D) 600-200 MG-UNIT TABS Take 1,200 mg by  mouth daily.        . clonazePAM (KLONOPIN) 1 MG tablet TAKE 1/2 TABLET BY MOUTH EVERY MORNING AND AFTERNOON AND 1 TABLET AT BEDTIME  180 tablet  0  . CRESTOR 40 MG tablet TAKE 1 TABLET BY MOUTH DAILY  90 tablet  0  . nadolol (CORGARD) 40 MG tablet TAKE 1 TABLET BY MOUTH EVERY MORNING AND THEN TAKE 2 TABLETS BY MOUTH EVERY EVENING  270 tablet  1  . omeprazole (PRILOSEC) 20 MG capsule Take 20 mg by mouth daily.        . sertraline (ZOLOFT) 100 MG tablet TAKE 1 TABLET BY MOUTH DAILY  90 tablet  0  . Naproxen Sodium (ALEVE) 220 MG CAPS Take 1 each by mouth as needed.         No current facility-administered medications on file prior to visit.   No Known Allergies  ROS:  No chest pain, palpitations, nausea, vomiting, blood in stool, abdominal pain, bleeding  (just nose bleed), bruising, rashes or other complaints except as per HPI.  PHYSICAL EXAM: BP 120/70  Pulse 60  Temp(Src) 97.9 F (36.6 C) (Oral)  Ht 5' 4.25" (1.632 m)  Wt 177 lb (80.287 kg)  BMI 30.14 kg/m2 Well appearing female, in no acute distress.  Rare dry cough. Some nose-blowing HEENT:  PERRL, EOMI, conjunctiva clear.  TM's and EAC's normal bilaterally.  Nasal mucosa mildly edematous, clear mucus.  OP with some erythema posteriorly. No exudate. Sinuses nontender Neck: no lymphadenopathy Heart: regular rate and rhythm, no murmur Lungs: clear bilaterally, rare ronchi, cleared with cough Skin: no rashes Psych: normal mood, affect Neuro: alert and oriented  ASSESSMENT/PLAN:  Acute bronchitis - Plan: amoxicillin (AMOXIL) 875 MG tablet  Tobacco use disorder  Acute bronchitis, as complication of recent URI, in a smoker. Treat with amoxacillin.  Continue guaifenesin (mucinex)--take as directed. This will keep the phlegm loose  You should drink plenty of fluids. You can try taking dextromethorphan as a cough suppressant (this is in DM versions of guaifenesin-containing medications--ie Robitussin DM or Mucinex DM, or can be found separately in Delsym syrup). Sleep with the head of the bed elevated. Consider doing sinus rinses (sinus rinse kit or neti-pot) prior to bed.  Please do NOT restart smoking cigarettes

## 2013-10-28 ENCOUNTER — Telehealth: Payer: Self-pay | Admitting: Family Medicine

## 2013-10-28 NOTE — Telephone Encounter (Signed)
Please call  Left ear still has not opened up , she can not hear out of it at all. Right ear got better but she still can not hear out of left ear. Advised patient that you may want to see her again to look at ear, pt did make appointment for Wednesday @ 1:30 but still wanted message sent back, incase you did not need to see her

## 2013-10-28 NOTE — Telephone Encounter (Signed)
I do need to see her to examine her ears.  Let her know you heard from me, and to keep her appt.

## 2013-10-29 ENCOUNTER — Encounter: Payer: Self-pay | Admitting: Family Medicine

## 2013-10-29 ENCOUNTER — Ambulatory Visit (INDEPENDENT_AMBULATORY_CARE_PROVIDER_SITE_OTHER): Payer: Medicare Other | Admitting: Family Medicine

## 2013-10-29 VITALS — BP 118/72 | HR 68 | Temp 97.4°F | Ht 64.25 in | Wt 178.0 lb

## 2013-10-29 DIAGNOSIS — H9190 Unspecified hearing loss, unspecified ear: Secondary | ICD-10-CM

## 2013-10-29 DIAGNOSIS — H9192 Unspecified hearing loss, left ear: Secondary | ICD-10-CM

## 2013-10-29 MED ORDER — METHYLPREDNISOLONE (PAK) 4 MG PO TABS: ORAL_TABLET | ORAL | Status: DC

## 2013-10-29 NOTE — Patient Instructions (Signed)
Complete the course of amoxacillin. There does not appear to be an ear infection.  Most likely, it is a problem of eustachian tube dysfunction that is contributing to your hearing loss.  However, I can't rule out a virus affecting the nerve.  Since your blood pressure is great today, let's go ahead and have you take sudafed (sudafed PE on the shelf at pharmacy is fine) to see if that helps.  If the ear unplugs and hearing improves even just temporarily, then we know that it isn't a nerve problem, and you don't need to take the course of steroids.   If you take a couple of doses of the sudafed, and get absolutely no improvement in your hearing, then go ahead and fill the prescription for the steroids.   If your hearing does not improve with these measures, please schedule an appointment with an ENT (Dr. Lazarus SalinesWolicki is fine).  If you need us to do it for you.

## 2013-10-29 NOTE — Progress Notes (Signed)
Chief Complaint  Patient presents with  . Ear Fullness    cannot hear out of her left hear.    She was seen last week with URI symptoms, diagnosed with bronchitis. She is taking amoxacillin and reports that she is much better--no longer having congestion, only slight residual cough.  She presents today with ongoing loss of hearing in the left ear.  The right ear unplugged, but the left ear has never unplugged.  It has been completely stopped up since prior to her last visit. She has tried popping her ears, but isn't able.  She denies any pain.  She hears a slight noise from the ear, but not ringing.  No vertigo. She has been using ear wax drops (OTC) without any benefit.  Past Medical History  Diagnosis Date  . GERD (gastroesophageal reflux disease)   . Tremor, essential   . Migraine   . Depression   . History of endometriosis   . Elevated cholesterol 01/2005  . Elevated triglycerides with high cholesterol 01/2005  . Elevated C-reactive protein (CRP) 01/2005   Past Surgical History  Procedure Laterality Date  . Colonoscopy  05/26/2008  . Uterine fibroid surgery  1977  . Appendectomy  1977  . Laparoscopy  1981    endometriosis  . Abdominal adhesion surgery  1981  . R breast lump  1992    benign  . Tonsilectomy, adenoidectomy, bilateral myringotomy and tubes  1953    age 68  . Plastic surgery-neck  2008   History   Social History  . Marital Status: Divorced    Spouse Name: N/A    Number of Children: 1  . Years of Education: N/A   Occupational History  . 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 Smoking status: Current Every Day Smoker -- 0.25 packs/day for 30 years    Types: Cigarettes  . Smokeless tobacco: Never Used     Comment: 3-5 cigarettes/day, cutting back  . Alcohol Use: Yes     Comment: rarely, 1-2 times per year   . Drug Use: No  . Sexual Activity: Not Currently   Other Topics Concern  . Not on file   Social History Narrative    Lives alone.  Son lives in [place], 4 grandchildren   Current outpatient prescriptions:amoxicillin (AMOXIL) 875 MG tablet, Take 1 tablet (875 mg total) by mouth 2 (two) times daily., Disp: 20 tablet, Rfl: 0;  aspirin 81 MG tablet, Take 81 mg by mouth daily.  , Disp: , Rfl: ;  Calcium Carbonate-Vitamin D (CALCIUM + D) 600-200 MG-UNIT TABS, Take 1,200 mg by mouth daily.  , Disp: , Rfl:  clonazePAM (KLONOPIN) 1 MG tablet, TAKE 1/2 TABLET BY MOUTH EVERY MORNING AND AFTERNOON AND 1 TABLET AT BEDTIME, Disp: 180 tablet, Rfl: 0;  CRESTOR 40 MG tablet, TAKE 1 TABLET BY MOUTH DAILY, Disp: 90 tablet, Rfl: 0;  dextromethorphan-guaiFENesin (MUCINEX DM) 30-600 MG per 12 hr tablet, Take 1 tablet by mouth 2 (two) times daily., Disp: , Rfl:  nadolol (CORGARD) 40 MG tablet, TAKE 1 TABLET BY MOUTH EVERY MORNING AND THEN TAKE 2 TABLETS BY MOUTH EVERY EVENING, Disp: 270 tablet, Rfl: 1;  Naproxen Sodium (ALEVE) 220 MG CAPS, Take 1 each by mouth as needed.  , Disp: , Rfl: ;  omeprazole (PRILOSEC) 20 MG capsule, Take 20 mg by mouth daily.  , Disp: , Rfl: ;  sertraline (ZOLOFT) 100 MG tablet, TAKE 1 TABLET BY MOUTH DAILY, Disp: 90  tablet, Rfl: 0  No Known Allergies  ROS:  Denies fevers, chills, nausea, vomiting, vertigo, dizziness, headache, shortness of breath, chest pain or other concerns, except as per HPI  PHYSICAL EXAM: BP 118/72  Pulse 68  Temp(Src) 97.4 F (36.3 C) (Oral)  Ht 5' 4.25" (1.632 m)  Wt 178 lb (80.74 kg)  BMI 30.31 kg/m2 Well developed, pleasant female in no distress HEENT:  PERRL, conjunctiva clear.  TM's and EACs are normal bilaterally.  No evidence of cerumen impaction or effusion.  No erythema of TM's and light reflex is normal.  OP is clear. Neck: no lymphadenopathy, thyromegaly or mass  ASSESSMENT/PLAN:  Hearing loss, left - suspect due to ETD from recent illness, but can't exclude viral etiology to hearing loss (neural?) - Plan: methylPREDNIsolone (MEDROL DOSPACK) 4 MG  tablet   Complete the course of amoxacillin. There does not appear to be an ear infection.  Most likely, it is a problem of eustachian tube dysfunction that is contributing to your hearing loss.  However, I can't rule out a virus affecting the nerve.  Since your blood pressure is great today, let's go ahead and have you take sudafed (sudafed PE on the shelf at pharmacy is fine) to see if that helps.  If the ear unplugs and hearing improves even just temporarily, then we know that it isn't a nerve problem, and you don't need to take the course of steroids.   If you take a couple of doses of the sudafed, and get absolutely no improvement in your hearing, then go ahead and fill the prescription for the steroids.   If your hearing does not improve with these measures, please schedule an appointment with an ENT (Dr. Lazarus Salines is fine).  Call if you need [place] to do it for you.  Risks/side effects of steroids reviewed in detail

## 2013-11-17 ENCOUNTER — Encounter: Payer: Self-pay | Admitting: Family Medicine

## 2013-11-17 ENCOUNTER — Ambulatory Visit (INDEPENDENT_AMBULATORY_CARE_PROVIDER_SITE_OTHER): Payer: Medicare Other | Admitting: Family Medicine

## 2013-11-17 VITALS — BP 120/78 | HR 72 | Ht 64.25 in | Wt 178.0 lb

## 2013-11-17 DIAGNOSIS — K219 Gastro-esophageal reflux disease without esophagitis: Secondary | ICD-10-CM

## 2013-11-17 DIAGNOSIS — F17200 Nicotine dependence, unspecified, uncomplicated: Secondary | ICD-10-CM

## 2013-11-17 DIAGNOSIS — Z79899 Other long term (current) drug therapy: Secondary | ICD-10-CM

## 2013-11-17 DIAGNOSIS — E782 Mixed hyperlipidemia: Secondary | ICD-10-CM

## 2013-11-17 DIAGNOSIS — F329 Major depressive disorder, single episode, unspecified: Secondary | ICD-10-CM | POA: Diagnosis not present

## 2013-11-17 DIAGNOSIS — IMO0002 Reserved for concepts with insufficient information to code with codable children: Secondary | ICD-10-CM

## 2013-11-17 DIAGNOSIS — G250 Essential tremor: Secondary | ICD-10-CM

## 2013-11-17 DIAGNOSIS — F3289 Other specified depressive episodes: Secondary | ICD-10-CM

## 2013-11-17 DIAGNOSIS — F325 Major depressive disorder, single episode, in full remission: Secondary | ICD-10-CM

## 2013-11-17 DIAGNOSIS — G252 Other specified forms of tremor: Secondary | ICD-10-CM

## 2013-11-17 LAB — LIPID PANEL
Cholesterol: 153 mg/dL (ref 0–200)
HDL: 53 mg/dL (ref >39)
LDL Cholesterol: 50 mg/dL (ref 0–99)
TRIGLYCERIDES: 249 mg/dL — AB (ref <150)
Total CHOL/HDL Ratio: 2.9 Ratio
VLDL: 50 mg/dL — AB (ref 0–40)

## 2013-11-17 LAB — HEPATIC FUNCTION PANEL
ALT: 18 U/L (ref 0–35)
AST: 22 U/L (ref 0–37)
Albumin: 4.6 g/dL (ref 3.5–5.2)
Alkaline Phosphatase: 59 U/L (ref 39–117)
BILIRUBIN DIRECT: 0.1 mg/dL (ref 0.0–0.3)
BILIRUBIN INDIRECT: 0.3 mg/dL (ref 0.2–1.2)
Total Bilirubin: 0.4 mg/dL (ref 0.2–1.2)
Total Protein: 6.3 g/dL (ref 6.0–8.3)

## 2013-11-17 NOTE — Progress Notes (Signed)
Chief Complaint  Patient presents with  . Hyperlipidemia    fasting med check.    Recently seen with L hearing loss.  Hearing improved with sudafed alone, never needed to fill the steroid pack.  Hearing is back to normal, and her URI has finally resolved.  Tremor--well controlled with nadolol and clonazepam. Taking 1 BID of the clonazepam, itt is effective and doesn't cause sedation.  GERD--well controlled with omeprazole. Denies dysphagia. She missed a pill once last week and had some recurrent reflux symptoms (epigastric discomfort).  Hyperlipidemia follow-up: Patient is reportedly following a low-fat, low cholesterol diet. Compliant with medications and denies medication side effects.  Her TG were >300 at last check in July.  It was recommended to start fish oil, but she reports that she didn't tolerate it in the past, so didn't start.  She has been trying to eat more vegetables and less chocolate, but admits that chocolate is her "downfall".  Depression--well controlled on zoloft. Has gone off meds in the past, and had recurrence of depression, some of which patient relates to work stress. We discussed considering cutting dose in 1/2 at her last visit 6 months ago, but she prefers to stay on current dose.  She denies any side effects of medication.   Smoker--tried Chantix in 2010, but had weird hallucinatory dreams. She has been cutting back, she is ready to quit, is down to 4-5 cigarettes per day.  She changed where she keeps her cigarettes, has to walk across the house, and that has helped her think before she smokes, rather than being automatic.  F/u on her skin lesion noted at her visit 6 months ago:  She never ended up scheduling appointment with the dermatologist.  She doesn't think they have changed. 2 nodules L leg (one upper/lateral, measuring 5mm, one medially on lower portion of upper left leg, measuring 3-714mm) that are somewhat pink, firm   Reminded at her [facility] 6 months ago to  schedule mammogram.  She has not yet scheduled this.  She is getting an elliptical delivered tomorrow.  Past Medical History  Diagnosis Date  . GERD (gastroesophageal reflux disease)   . Tremor, essential   . Migraine   . Depression   . History of endometriosis   . Elevated cholesterol 01/2005  . Elevated triglycerides with high cholesterol 01/2005  . Elevated C-reactive protein (CRP) 01/2005   Past Surgical History  Procedure Laterality Date  . Colonoscopy  05/26/2008  . Uterine fibroid surgery  1977  . Appendectomy  1977  . Laparoscopy  1981    endometriosis  . Abdominal adhesion surgery  1981  . R breast lump  1992    benign  . Tonsilectomy, adenoidectomy, bilateral myringotomy and tubes  1953    age 89  . Plastic surgery-neck  2008   History   Social History  . Marital Status: Divorced    Spouse Name: N/A    Number of Children: 1  . Years of Education: N/A   Occupational History  . 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 Smoking status: Current Every Day Smoker -- 0.25 packs/day for 30 years    Types: Cigarettes  . Smokeless tobacco: Never Used     Comment: 3-5 cigarettes/day, cutting back  . Alcohol Use: Yes     Comment: rarely, 1-2 times per year   . Drug Use: No  . Sexual Activity: Not Currently   Other Topics Concern  . Not on  file   Social History Narrative   Lives alone.  Son lives in [place], [place] grandchildren    Outpatient Encounter Prescriptions as of 11/17/2013  Medication Sig  . aspirin 81 MG tablet Take 81 mg by mouth daily.    . Calcium Carbonate-Vitamin D (CALCIUM + D) 600-200 MG-UNIT TABS Take 1,200 mg by mouth daily.    . clonazePAM (KLONOPIN) 1 MG tablet TAKE 1/2 TABLET BY MOUTH EVERY MORNING AND AFTERNOON AND 1 TABLET AT BEDTIME  . CRESTOR 40 MG tablet TAKE 1 TABLET BY MOUTH DAILY  . nadolol (CORGARD) 40 MG tablet TAKE 1 TABLET BY MOUTH EVERY MORNING AND THEN TAKE 2 TABLETS BY MOUTH EVERY EVENING  .  omeprazole (PRILOSEC) 20 MG capsule Take 20 mg by mouth daily.    . sertraline (ZOLOFT) 100 MG tablet TAKE 1 TABLET BY MOUTH DAILY  . Naproxen Sodium (ALEVE) 220 MG CAPS Take 1 each by mouth as needed.    . [DISCONTINUED] amoxicillin (AMOXIL) 875 MG tablet Take 1 tablet (875 mg total) by mouth 2 (two) times daily.  . [DISCONTINUED] dextromethorphan-guaiFENesin (MUCINEX DM) 30-600 MG per 12 hr tablet Take 1 tablet by mouth 2 (two) times daily.  . [DISCONTINUED] methylPREDNIsolone (MEDROL DOSPACK) 4 MG tablet follow package directions   No Known Allergies  ROS:  Denies fevers, chills, URI symptoms, cough, shortness of breath.  Some mild chronic PND.  Denies chest pain, palpitations, nausea, vomiting, bowel changes, urinary complaints, joint pains, depression.  Denies bleeding, bruising, rashes, changes in skin lesions, or other concerns  PHYSICAL EXAM: BP 120/78  Pulse 72  Ht 5' 4.25" (1.632 m)  Wt 178 lb (80.74 kg)  BMI 30.31 kg/m2 Well developed, pleasant female in no distress HEENT:  PERRL, EOMI.  TM's and EAC's normal.  OP with some erythema/cobblestoning posteriorly.  Sinuses nontender.  Neck: no lymphadenopathy, thyromegaly or mass Heart: regular rate and rhythm without murmur Lungs: clear bilaterally Abdomen: soft, nontender, no organomegaly or mass Extremities: no edema Skin: Left lateral thigh:  Firm pink nodule, measuring 6-7 mm in width, 5-6 in height.  nontender Psych: normal mood, affect, hygiene and grooming Neuro: very mild resting head tremor.  Alert and oriented.  Cranial nerves intact.  Normal strength, gait  ASSESSMENT/PLAN:  Mixed hyperlipidemia - Plan: Lipid panel  Tobacco use disorder - encouraged to continue to cut back, and ultimately quit prior to next visit  GERD (gastroesophageal reflux disease) - controlled  Depressive disorder, not elsewhere classified - stable  Benign essential tremor - controlled  Encounter for long-term (current) use of other  medications - Plan: Hepatic function panel, Lipid panel  Depression, major, in remission  Mixed hyperlipidemia--recheck lipids today.  If TG remain elevated, we discussed either trying a different brand of fish oil (at 3000-4000mg  daily), and if she gets recurrent side effects (fishy burps) then will call us for a prescription for Lovaza.  Discussed using elliptical 30 minutes daily.  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  Slightly larger.  She plans to f/u with dermatologist  Just got 3 month rx's within the month; will contact us when refills are needed.  F/u in 6 months for fasting med check with [facility]

## 2013-11-17 NOTE — Patient Instructions (Addendum)
Please call [facility] and schedule your yearly mammogram.   Schedule appointment with dermatologist.  Continue to cut back on smoking--consider using the portion control/ziploc baggie method we discussed.  Ultimate goal is to be completely quit before your next follow-up visit.  Cut back 1 cigarette each 1-2 weeks, as you are able to, in order to continue taper.   Available resources to help you quit include free counseling through [facility] ([place] or 1-800-QUITNOW), smoking cessation classes through [facility] (call to find out schedule), over-the-counter nicotine replacements, and e-cigarettes (although this may not help break the hand-mouth habit).  Many insurance companies also have smoking cessation programs (which may decrease the cost of patches, meds if enrolled).   Continue to try and follow a lowfat diet, avoiding sweets/chocolate.  If your triglycerides remain high on today's labs, we will advise you to re-try fish oil.  Try a different brand.  Ideally we would like 3000-4000 mg daily, but you can only tolerate 2000 mg, then take that amount.  If your TG remain very high, we might need to try a prescription such as Lovaza in the future.  It is recommended that you get at least 30 minutes of aerobic exercise at least 5 days/week (for weight loss, you may need as much as 60-90 minutes). This can be any activity that gets your heart rate up. This can be divided in 10-15 minute intervals if needed, but try and build up your endurance at least once a week.  Weight bearing exercise is also recommended twice weekly. Good luck with your new elliptical!

## 2013-11-26 ENCOUNTER — Encounter: Payer: Self-pay | Admitting: *Deleted

## 2013-12-29 ENCOUNTER — Telehealth: Payer: Self-pay | Admitting: *Deleted

## 2013-12-29 NOTE — Telephone Encounter (Signed)
Patient called and stated that she used to see neuro in [place]-Dr. Leilani MerlSader but no longer does and that Dr.Knapp has been refilling her neuro meds for her. She feels that her essential tremor may be worsening and also had heard about some new and up to the minute medications for this and has been in contact with [facility] and would like to go see them. [facility] is just requesting that we put in a referral for her and they will schedule-is this okay with you?

## 2013-12-29 NOTE — Telephone Encounter (Signed)
[facility]

## 2013-12-31 ENCOUNTER — Other Ambulatory Visit: Payer: Self-pay | Admitting: *Deleted

## 2013-12-31 DIAGNOSIS — G250 Essential tremor: Secondary | ICD-10-CM

## 2013-12-31 NOTE — Telephone Encounter (Signed)
Referral entered and pt notified.

## 2014-01-08 ENCOUNTER — Encounter: Payer: Self-pay | Admitting: Neurology

## 2014-01-08 ENCOUNTER — Encounter (INDEPENDENT_AMBULATORY_CARE_PROVIDER_SITE_OTHER): Payer: Self-pay

## 2014-01-08 ENCOUNTER — Ambulatory Visit (INDEPENDENT_AMBULATORY_CARE_PROVIDER_SITE_OTHER): Payer: Medicare Other | Admitting: Neurology

## 2014-01-08 VITALS — BP 107/61 | HR 64 | Ht 62.5 in | Wt 177.0 lb

## 2014-01-08 DIAGNOSIS — G252 Other specified forms of tremor: Secondary | ICD-10-CM

## 2014-01-08 DIAGNOSIS — G250 Essential tremor: Secondary | ICD-10-CM

## 2014-01-08 MED ORDER — PROPRANOLOL HCL ER 80 MG PO CP24: 80.0000 mg | ORAL_CAPSULE | Freq: Every day | ORAL | Status: DC

## 2014-01-08 NOTE — Progress Notes (Signed)
Subjective:    Patient ID: Lindsey Frank is a 68 y.o. female.  HPI    Huston FoleySaima Izick Gasbarro, MD, PhD [facility] [address], [address] [address] [place], [place] [id number]  Dear Dr. Lynelle DoctorKnapp,   I saw your patient, Lindsey Frank, upon your kind request in my neurologic clinic today for initial consultation of her tremors. The patient is unaccompanied today. As you know, [name] is a very pleasant 68 year old right-handed woman with an underlying medical history of reflux disease, migraine headaches, depression, hyperlipidemia, status post appendectomy, tonsillectomy and adenoidectomy, as well as laparoscopic surgery for endometriosis, who has a long-standing history of essential tremor. She used to see [place] Epimenio FootSater [place] [facility] [place] in the past, but had not seen any neurologist in years. She was stable with with clonazepam and nadolol for years. She retired in 2011. She also saw Dr. Sandria ManlyLove in or around 2011 and was tested for MS, but was told she did not have MS. She could not tolerate mysoline, which made her sick. Nadolol is expensive for. She has no FHx of ET, but there is a FHx of dementia and possibly PD. Her symptoms started in the 90s and she had a head tremor and bilateral hand tremors, and she feels she has progressed. She takes nadolol 40 mg in AM and 80 mg at night. Dr. Sandria ManlyLove encouraged her to come off of clonazepam. She tried other meds, but no gabapentin or topamax. She has not been on propranolol. She rarely drinks alcohol. She smokes about 1/2 ppd. She is trying to quit smoking.    Her Past Medical History Is Significant For: Past Medical History  Diagnosis Date  . GERD (gastroesophageal reflux disease)   . Tremor, essential   . Migraine   . Depression   . History of endometriosis   . Elevated cholesterol 01/2005  . Elevated triglycerides with high cholesterol 01/2005  . Elevated C-reactive protein (CRP) 01/2005   Her Past Surgical History Is Significant  For: Past Surgical History  Procedure Laterality Date  . Colonoscopy  05/26/2008  . Uterine fibroid surgery  1977  . Appendectomy  1977  . Laparoscopy  1981    endometriosis  . Abdominal adhesion surgery  1981  . R breast lump  1992    benign  . Tonsilectomy, adenoidectomy, bilateral myringotomy and tubes  1953    age 89  . Plastic surgery-neck  2008    Her Family History Is Significant For: Family History  Problem Relation Age of Onset  . Diabetes Mother   . Cancer Father     throat  . Heart disease Father [id number]  . Stroke Father [id number]  . Depression Brother     Her Social History Is Significant For: History   Social History  . Marital Status: Divorced    Spouse Name: N/A    Number of Children: 1  . Years of Education: college   Occupational History  . 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 Smoking status: Current Every Day Smoker -- 0.25 packs/day for 30 years    Types: Cigarettes  . Smokeless tobacco: Never Used     Comment: 3-5 cigarettes/day, cutting back  . Alcohol Use: 0.0 oz/week     Comment: rarely, 1-2 times per year   . Drug Use: No  . Sexual Activity: Not Currently   Other Topics Concern  . None   Social History Narrative   Lives alone.  Son  lives in [place], 4 grandchildren   College Education   Right handed   Caffeine one cup daily.    Her Allergies Are:  No Known Allergies:   Her Current Medications Are:  Outpatient Encounter Prescriptions as of 01/08/2014  Medication Sig  . Calcium Carbonate-Vitamin D (CALCIUM + D) 600-200 MG-UNIT TABS Take 1,200 mg by mouth daily.    . clonazePAM (KLONOPIN) 1 MG tablet TAKE 1/2 TABLET BY MOUTH EVERY MORNING AND AFTERNOON AND 1 TABLET AT BEDTIME  . CRESTOR 40 MG tablet TAKE 1 TABLET BY MOUTH DAILY  . nadolol (CORGARD) 40 MG tablet TAKE 1 TABLET BY MOUTH EVERY MORNING AND THEN TAKE 2 TABLETS BY MOUTH EVERY EVENING  . omeprazole (PRILOSEC) 20 MG capsule Take 20 mg by mouth  daily.    . sertraline (ZOLOFT) 100 MG tablet TAKE 1 TABLET BY MOUTH DAILY  . [DISCONTINUED] aspirin 81 MG tablet Take 81 mg by mouth daily.    . [DISCONTINUED] Naproxen Sodium (ALEVE) 220 MG CAPS Take 1 each by mouth as needed.    :   Review of Systems:  Out of a complete 14 point review of systems, all are reviewed and negative with the exception of these symptoms as listed below:   Review of Systems  Neurological: Positive for tremors.    Objective:  Neurologic Exam  Physical Exam Physical Examination:   Filed Vitals:   01/08/14 1316  BP: 107/61  Pulse: 64    General Examination: The patient is a very pleasant 68 y.o. female in no acute distress. She appears well-developed and well-nourished and well groomed.   HEENT: Normocephalic, atraumatic, pupils are equal, round and reactive to light and accommodation. Funduscopic exam is normal with sharp disc margins noted. Extraocular tracking is good without limitation to gaze excursion or nystagmus noted. Normal smooth pursuit is noted. Hearing is grossly intact. Tympanic membranes are clear bilaterally. Face is symmetric with mild decrease in facial animation and normal facial sensation. Speech is clear with no dysarthria noted. There is no hypophonia. There is a mild yes-yes type head tremor and a slight voice tremor. Neck is supple with full range of passive and active motion. There are no carotid bruits on auscultation. Oropharynx exam reveals: mild mouth dryness, adequate dental hygiene and mild airway crowding, due to thicker soft palate and larger uvula and elongated tongue. Mallampati is class II. Tongue protrudes centrally and palate elevates symmetrically. Tonsils are absent.   Chest: Clear to auscultation without wheezing, rhonchi or crackles noted.  Heart: S1+S2+0, regular and normal without murmurs, rubs or gallops noted.   Abdomen: Soft, non-tender and non-distended with normal bowel sounds appreciated on  auscultation.  Extremities: There is no pitting edema in the distal lower extremities bilaterally. Pedal pulses are intact.  Skin: Warm and dry without trophic changes noted. There are no varicose veins.  Musculoskeletal: exam reveals no obvious joint deformities, tenderness or joint swelling or erythema.   Neurologically:  Mental status: The patient is awake, alert and oriented in all 4 spheres. Her immediate and remote memory, attention, language skills and fund of knowledge are appropriate. There is no evidence of aphasia, agnosia, apraxia or anomia. Speech is clear with normal prosody and enunciation. Thought process is linear. Mood is normal and affect is normal.  Cranial nerves II - XII are as described above under HEENT exam. In addition: shoulder shrug is normal with equal shoulder height noted. Motor exam: Normal bulk, strength and tone is noted. There is no drift, or  rebound. There is no resting tremor. There is a bilateral upper extremity postural and action tremor, which is minimal in degree. There tremor frequency is fairly fast and the amplitude is small. On Archimedes spiral drawing there is mild tremulousness noted. Handwriting is mildly tremulousness, but legible. There is no evidence of micrographia.  Romberg is negative. Reflexes are 2+ throughout. Babinski: Toes are flexor bilaterally. Fine motor skills and coordination: intact with normal finger taps, normal hand movements, normal rapid alternating patting, normal foot taps and normal foot agility.  Cerebellar testing: No dysmetria or intention tremor on finger to nose testing. Heel to shin is unremarkable bilaterally. There is no truncal or gait ataxia.  Sensory exam: intact to light touch, pinprick, vibration, temperature sense and proprioception in the upper and lower extremities.  Gait, station and balance: She stands easily. No veering to one side is noted. No leaning to one side is noted. Posture is age-appropriate and  stance is narrow based. Gait shows normal stride length and normal pace. No problems turning are noted. She turns en bloc. Tandem walk is quite difficult for her and so is toe and heel stance.               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 Her physical exam is otherwise non-focal, with the exception of very mild facial masking noted, which may be normal for her.  I had a long chat with the patient about my findings and the diagnosis of ET, the prognosis and treatment options. We talked about medical treatments and non-pharmacological approaches. We talked about smoking cessation and maintaining a healthy lifestyle in general. I encouraged the patient to eat healthy, exercise daily and keep well hydrated, to keep a scheduled bedtime and wake time routine, to not skip any meals and eat healthy snacks in between meals and to have protein with every meal.   As far as further diagnostic testing is concerned, I suggested the following today: no test at this time.  As far as medications are concerned, I recommended the following at this time: we can try to change her nadolol to propranolol long acting, 80 mg once daily. She can continue her clonazepam for now, but it the long run, I would like to see her come off of the benzodiazepine. The sertraline can exacerbate tremors. We talked about other tremor triggers as well, including anxiety, anger, nervousness, excitement, dehydration, sleep deprivation, caffeine, and low blood sugar values or blood sugar fluctuations.  I answered all her questions today and the patient was in agreement with the  above outlined plan. I would like to see the patient back in 6 months, sooner if the need arises and encouraged her to call with any interim questions, concerns, problems or updates and refill requests.   Thank you very much for allowing me to participate in the care of this nice patient. If I can be of any further assistance to you please do not hesitate to call me at [phone].  Sincerely,   [name], MD, PhD

## 2014-01-08 NOTE — Patient Instructions (Addendum)
  Please remember, that any kind of tremor may be exacerbated by anxiety, anger, nervousness, excitement, dehydration, sleep deprivation, by caffeine, and low blood sugar values or blood sugar fluctuations. Some medications, especially some antidepressants and lithium can cause or exacerbate tremors. Tremors may temporarily calm down her subside with the use of a benzodiazepine such as Valium or related medications and with alcohol. Be aware however that drinking alcohol is not an approved treatment or appropriate treatment for tremor control and long-term use of benzodiazepines such as Valium, lorazepam, alprazolam, or clonazepam can cause habit formation, physical and psychological addiction.  We will change the nadolol to propranolol ER 80 mg once daily. Common side effect reported are: lethargy, sedation, low blood pressure and low pulse rate. Please monitor your BP and Pulse every few days, if your pulse drops lower than 55 you may feel bad and we may have to adjust your dose. Same with your BP below 110/55.   Please stop the nadolol when you start the propranolol. You can continue the clonazepam for now, but I would like to get you off of it in the long run.   Please quit smoking.

## 2014-01-23 ENCOUNTER — Other Ambulatory Visit: Payer: Self-pay | Admitting: Family Medicine

## 2014-01-23 NOTE — Telephone Encounter (Signed)
Patient has appt scheduled in August-is it okay to call in clonazepam?

## 2014-01-23 NOTE — Telephone Encounter (Signed)
Ok for #180 with 2 refills on clonazepam, and for others until her August appt

## 2014-02-18 ENCOUNTER — Encounter: Payer: Self-pay | Admitting: *Deleted

## 2014-03-03 DIAGNOSIS — Z1231 Encounter for screening mammogram for malignant neoplasm of breast: Secondary | ICD-10-CM | POA: Diagnosis not present

## 2014-03-03 LAB — HM MAMMOGRAPHY: HM MAMMO: NEGATIVE

## 2014-03-04 ENCOUNTER — Encounter: Payer: Self-pay | Admitting: Internal Medicine

## 2014-03-27 DIAGNOSIS — H04129 Dry eye syndrome of unspecified lacrimal gland: Secondary | ICD-10-CM | POA: Diagnosis not present

## 2014-03-27 DIAGNOSIS — H259 Unspecified age-related cataract: Secondary | ICD-10-CM | POA: Diagnosis not present

## 2014-03-27 DIAGNOSIS — H43819 Vitreous degeneration, unspecified eye: Secondary | ICD-10-CM | POA: Diagnosis not present

## 2014-03-27 DIAGNOSIS — H5310 Unspecified subjective visual disturbances: Secondary | ICD-10-CM | POA: Diagnosis not present

## 2014-04-21 ENCOUNTER — Other Ambulatory Visit: Payer: Self-pay | Admitting: Family Medicine

## 2014-04-21 DIAGNOSIS — F3289 Other specified depressive episodes: Secondary | ICD-10-CM

## 2014-04-21 DIAGNOSIS — F329 Major depressive disorder, single episode, unspecified: Secondary | ICD-10-CM

## 2014-04-21 DIAGNOSIS — E782 Mixed hyperlipidemia: Secondary | ICD-10-CM

## 2014-04-21 NOTE — Telephone Encounter (Signed)
Dr.Knapp is this okay to refill she has appointment with you in Aug.

## 2014-04-21 NOTE — Telephone Encounter (Signed)
done

## 2014-04-30 ENCOUNTER — Ambulatory Visit (INDEPENDENT_AMBULATORY_CARE_PROVIDER_SITE_OTHER): Payer: Medicare Other | Admitting: Family Medicine

## 2014-04-30 ENCOUNTER — Telehealth: Payer: Self-pay | Admitting: *Deleted

## 2014-04-30 ENCOUNTER — Encounter: Payer: Self-pay | Admitting: Family Medicine

## 2014-04-30 VITALS — BP 110/70 | HR 84 | Ht 64.25 in | Wt 172.0 lb

## 2014-04-30 DIAGNOSIS — G252 Other specified forms of tremor: Secondary | ICD-10-CM | POA: Diagnosis not present

## 2014-04-30 DIAGNOSIS — M79672 Pain in left foot: Secondary | ICD-10-CM

## 2014-04-30 DIAGNOSIS — IMO0002 Reserved for concepts with insufficient information to code with codable children: Secondary | ICD-10-CM

## 2014-04-30 DIAGNOSIS — G250 Essential tremor: Secondary | ICD-10-CM | POA: Diagnosis not present

## 2014-04-30 DIAGNOSIS — M79609 Pain in unspecified limb: Secondary | ICD-10-CM

## 2014-04-30 DIAGNOSIS — F325 Major depressive disorder, single episode, in full remission: Secondary | ICD-10-CM

## 2014-04-30 MED ORDER — CLONAZEPAM 1 MG PO TABS: 1.0000 mg | ORAL_TABLET | Freq: Every day | ORAL | Status: DC

## 2014-04-30 MED ORDER — NADOLOL 40 MG PO TABS: ORAL_TABLET | ORAL | Status: DC

## 2014-04-30 MED ORDER — NAPROXEN 500 MG PO TABS: 500.0000 mg | ORAL_TABLET | Freq: Two times a day (BID) | ORAL | Status: DC

## 2014-04-30 MED ORDER — SERTRALINE HCL 50 MG PO TABS: ORAL_TABLET | ORAL | Status: DC

## 2014-04-30 NOTE — Patient Instructions (Signed)
Ice the area if/when swollen, after activity. I recommend wearing supportive tennis shoes when walking a lot.  Take the anti-inflammatory twice daily for at least a week, or until completely better.  We can consider checking an x-ray if ongoing pain/problems when you return in August.

## 2014-04-30 NOTE — Telephone Encounter (Signed)
Error

## 2014-04-30 NOTE — Progress Notes (Signed)
Chief Complaint  Patient presents with  . Foot Pain    left foot pain x 3 weeks. Pain is mostly on the inside of her foot where there is a bump, can radiate around to the other side though. (Needs refill on clonazepam)   She first started having pain in her left foot while she was walking--she had been walking all day. Pain started at the medial aspect of the ankle.  She had the pain also spread to the lateral ankle, and into the foot. She was walking in her Ecco sandals (has arch support).  She had swelling that day.  She tried using an ACE wrap overnight, and the swelling was better the next morning.  Didn't wear wrap during the day, and swelling and pain recurred throughout the day.  No known injury, twisting, or fall.  This occurred 3 weeks ago, and pain has been persistent since then.  Some days it hurts a little less than others, perhaps related to less activity.    She took aleve twice daily for 2 days last week. It helped some.  She was told not to take NSAIDs with her meds, so did this short-term/cautiously.  Depression--She cut back on her zoloft to taking it every other day about 4-6 weeks ago, and moods are still good, hasn't noticed any change.  Tremor--stable.  She needs refills on clonazepam and nadolol.   Past Medical History  Diagnosis Date  . GERD (gastroesophageal reflux disease)   . Tremor, essential   . Migraine   . Depression   . History of endometriosis   . Elevated cholesterol 01/2005  . Elevated triglycerides with high cholesterol 01/2005  . Elevated C-reactive protein (CRP) 01/2005   Past Surgical History  Procedure Laterality Date  . Colonoscopy  05/26/2008  . Uterine fibroid surgery  1977  . Appendectomy  1977  . Laparoscopy  1981    endometriosis  . Abdominal adhesion surgery  1981  . R breast lump  1992    benign  . Tonsilectomy, adenoidectomy, bilateral myringotomy and tubes  1953    age 79  . Plastic surgery-neck  2008   History   Social History  .  Marital Status: Divorced    Spouse Name: N/A    Number of Children: 1  . Years of Education: college   Occupational History  . 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 Smoking status: Current Every Day Smoker -- 0.25 packs/day for 30 years    Types: Cigarettes  . Smokeless tobacco: Never Used     Comment: 3-5 cigarettes/day, cutting back  . Alcohol Use: 0.0 oz/week     Comment: rarely, 1-2 times per year   . Drug Use: No  . Sexual Activity: Not Currently   Other Topics Concern  . Not on file   Social History Narrative   Lives alone.  Son lives in [place], 4 grandchildren   College Education   Right handed   Caffeine one cup daily.   Outpatient Encounter Prescriptions as of 04/30/2014  Medication Sig Note  . Calcium Carbonate-Vitamin D (CALCIUM + D) 600-200 MG-UNIT TABS Take 1,200 mg by mouth daily.     . clonazePAM (KLONOPIN) 1 MG tablet Take 1 tablet (1 mg total) by mouth daily.   . CRESTOR 40 MG tablet TAKE 1 TABLET BY MOUTH EVERY DAY   . nadolol (CORGARD) 40 MG tablet TAKE 1 TABLET BY MOUTH EVERY MORNING AND THEN TAKE 2 TABLETS  BY MOUTH EVERY EVENING   . omeprazole (PRILOSEC) 20 MG capsule Take 20 mg by mouth daily.     . sertraline (ZOLOFT) 50 MG tablet Take 1 tablet by mouth daily   . [DISCONTINUED] clonazePAM (KLONOPIN) 1 MG tablet TAKE 1/2 TABLET BY MOUTH IN THE MORNING AND AFTERNOON AND 1 TABLET BY MOUTH EVERY NIGHT AT BEDTIME   . [DISCONTINUED] nadolol (CORGARD) 40 MG tablet TAKE 1 TABLET BY MOUTH EVERY MORNING AND THEN TAKE 2 TABLETS BY MOUTH EVERY EVENING   . [DISCONTINUED] sertraline (ZOLOFT) 100 MG tablet TAKE 1 TABLET BY MOUTH EVERY DAY   . [DISCONTINUED] sertraline (ZOLOFT) 100 MG tablet taking one tablet every other day 04/30/2014: She cut back to 1 tablet every other day  . naproxen (NAPROSYN) 500 MG tablet Take 1 tablet (500 mg total) by mouth 2 (two) times daily with a meal. Take as directed, as needed for pain   .  [DISCONTINUED] propranolol ER (INDERAL LA) 80 MG 24 hr capsule Take 1 capsule (80 mg total) by mouth daily.   . [DISCONTINUED] sertraline (ZOLOFT) 100 MG tablet take one tablet every other day    No Known Allergies  ROS:  Denies fevers, chills, headaches, dizziness, chest pain, URI symptoms, shortness of breath.  Denies nausea, vomiting, abdominal pain, bleeding, bruising.  See HPI. Moods are good.  Tremor stable.  PHYSICAL EXAM: BP 110/70  Pulse 84  Ht 5' 4.25" (1.632 m)  Wt 172 lb (78.019 kg)  BMI 29.29 kg/m2 Well developed, pleasant female in no distress She has mild tremor noted bilaterally, with no significant head bobbing today. Extremities:  Left foot:  nontender to palpation over calcanei Minimal soft tissue swelling noted posteriorly to lateral malleolus, nontender Normal strength, no pain with strength testing (inversion/eversion/plantar and dorsiflexion) Negative Drawer test. 2+ pulses No edema  ASSESSMENT/PLAN:  Foot pain, left - suspect tendonitis vs strain.  pretty normal exam.  consider x-ray at f/u if persistent pain - Plan: naproxen (NAPROSYN) 500 MG tablet  Benign essential tremor - stable - Plan: clonazePAM (KLONOPIN) 1 MG tablet, nadolol (CORGARD) 40 MG tablet  Depression, major, in remission - change to 50mg  tablet, take once daily.  Consider further taping down in future - Plan: sertraline (ZOLOFT) 50 MG tablet   Suspect tendonitis in L foot, mild, related to a lot of activity in probably not the best shoe.  Encouraged her to be walking in tennis shoes/sneakers.  Ice prn when swollen.  Risks/precautions of NSAIDs reviewed--she has no contraindications  Tremor--well controlled on current regimen--meds refilled.  Has med check in a month.

## 2014-05-12 ENCOUNTER — Other Ambulatory Visit: Payer: Self-pay | Admitting: Family Medicine

## 2014-05-12 NOTE — Telephone Encounter (Signed)
She was prescribed #30 on 7/16.  She shouldn't be out yet.  This was for an acute problem (strain/tendonitis) and not intended for longterm use.  She was supposed to have a 2 weeks course, and f/u if ongoing pain.  Do not refill

## 2014-05-12 NOTE — Telephone Encounter (Signed)
Is this okay?

## 2014-05-27 ENCOUNTER — Ambulatory Visit (INDEPENDENT_AMBULATORY_CARE_PROVIDER_SITE_OTHER): Payer: Medicare Other | Admitting: Family Medicine

## 2014-05-27 ENCOUNTER — Encounter: Payer: Self-pay | Admitting: Family Medicine

## 2014-05-27 ENCOUNTER — Other Ambulatory Visit (HOSPITAL_COMMUNITY)
Admission: RE | Admit: 2014-05-27 | Discharge: 2014-05-27 | Disposition: A | Discharge status: Home / Self Care | Payer: Medicare Other | Source: Ambulatory Visit | Attending: Family Medicine | Admitting: Family Medicine

## 2014-05-27 VITALS — BP 122/70 | HR 68 | Ht 65.0 in | Wt 172.0 lb

## 2014-05-27 DIAGNOSIS — G250 Essential tremor: Secondary | ICD-10-CM

## 2014-05-27 DIAGNOSIS — F325 Major depressive disorder, single episode, in full remission: Secondary | ICD-10-CM

## 2014-05-27 DIAGNOSIS — Z23 Encounter for immunization: Secondary | ICD-10-CM

## 2014-05-27 DIAGNOSIS — F17200 Nicotine dependence, unspecified, uncomplicated: Secondary | ICD-10-CM

## 2014-05-27 DIAGNOSIS — Z01419 Encounter for gynecological examination (general) (routine) without abnormal findings: Secondary | ICD-10-CM

## 2014-05-27 DIAGNOSIS — G252 Other specified forms of tremor: Secondary | ICD-10-CM

## 2014-05-27 DIAGNOSIS — IMO0002 Reserved for concepts with insufficient information to code with codable children: Secondary | ICD-10-CM

## 2014-05-27 DIAGNOSIS — Z124 Encounter for screening for malignant neoplasm of cervix: Secondary | ICD-10-CM | POA: Insufficient documentation

## 2014-05-27 DIAGNOSIS — Z0000 Encounter for general adult medical examination without abnormal findings: Secondary | ICD-10-CM | POA: Diagnosis not present

## 2014-05-27 DIAGNOSIS — E782 Mixed hyperlipidemia: Secondary | ICD-10-CM

## 2014-05-27 DIAGNOSIS — Z79899 Other long term (current) drug therapy: Secondary | ICD-10-CM

## 2014-05-27 DIAGNOSIS — R87810 Cervical high risk human papillomavirus (HPV) DNA test positive: Secondary | ICD-10-CM | POA: Diagnosis not present

## 2014-05-27 DIAGNOSIS — K219 Gastro-esophageal reflux disease without esophagitis: Secondary | ICD-10-CM

## 2014-05-27 DIAGNOSIS — Z1151 Encounter for screening for human papillomavirus (HPV): Secondary | ICD-10-CM | POA: Diagnosis not present

## 2014-05-27 LAB — COMPREHENSIVE METABOLIC PANEL
ALT: 18 U/L (ref 0–35)
AST: 21 U/L (ref 0–37)
Albumin: 4.7 g/dL (ref 3.5–5.2)
Alkaline Phosphatase: 69 U/L (ref 39–117)
BILIRUBIN TOTAL: 0.5 mg/dL (ref 0.2–1.2)
BUN: 12 mg/dL (ref 6–23)
CALCIUM: 10 mg/dL (ref 8.4–10.5)
CHLORIDE: 102 meq/L (ref 96–112)
CO2: 27 meq/L (ref 19–32)
CREATININE: 0.81 mg/dL (ref 0.50–1.10)
Glucose, Bld: 86 mg/dL (ref 70–99)
Potassium: 4.4 mEq/L (ref 3.5–5.3)
Sodium: 139 mEq/L (ref 135–145)
Total Protein: 6.7 g/dL (ref 6.0–8.3)

## 2014-05-27 LAB — CBC WITH DIFFERENTIAL/PLATELET
BASOS ABS: 0.1 10*3/uL (ref 0.0–0.1)
Basophils Relative: 1 % (ref 0–1)
Eosinophils Absolute: 0.3 10*3/uL (ref 0.0–0.7)
Eosinophils Relative: 3 % (ref 0–5)
HEMATOCRIT: 41.1 % (ref 36.0–46.0)
HEMOGLOBIN: 14 g/dL (ref 12.0–15.0)
LYMPHS PCT: 40 % (ref 12–46)
Lymphs Abs: 4.1 10*3/uL — ABNORMAL HIGH (ref 0.7–4.0)
MCH: 29 pg (ref 26.0–34.0)
MCHC: 34.1 g/dL (ref 30.0–36.0)
MCV: 85.3 fL (ref 78.0–100.0)
MONOS PCT: 7 % (ref 3–12)
Monocytes Absolute: 0.7 10*3/uL (ref 0.1–1.0)
NEUTROS ABS: 5 10*3/uL (ref 1.7–7.7)
Neutrophils Relative %: 49 % (ref 43–77)
Platelets: 281 10*3/uL (ref 150–400)
RBC: 4.82 MIL/uL (ref 3.87–5.11)
RDW: 15.1 % (ref 11.5–15.5)
WBC: 10.2 10*3/uL (ref 4.0–10.5)

## 2014-05-27 LAB — LIPID PANEL
CHOLESTEROL: 166 mg/dL (ref 0–200)
HDL: 56 mg/dL (ref >39)
LDL Cholesterol: 59 mg/dL (ref 0–99)
TRIGLYCERIDES: 255 mg/dL — AB (ref <150)
Total CHOL/HDL Ratio: 3 Ratio
VLDL: 51 mg/dL — ABNORMAL HIGH (ref 0–40)

## 2014-05-27 LAB — TSH: TSH: 2.54 u[IU]/mL (ref 0.350–4.500)

## 2014-05-27 NOTE — Progress Notes (Signed)
Chief Complaint  Patient presents with  . Med check plus    fasting med check plus with pap. Did not do eye exam, just had one March 27, 2014 with Dr.Stoneburner. Would like derm referral today.   Lindsey Frank is a 68 y.o. female who presents for a med check, as well as Annual Wellness Visit.   Follow-up on left foot pain (see July visit)--pain completely resolved with 2 week course of naproxen. No further problems.  Tremor--well controlled with nadolol and clonazepam. Taking 1 BID of the clonazepam. It is effective using it this way, without sedation.  She saw neurologist in March, who suggested changing from Nadolol to Propranolol, but she didn't tolerate this due to stomach upset (diarrhea). She went back to the nadolol, and is doing fine.  GERD--well controlled with omeprazole. Denies dysphagia. Never misses a pill to know if she has recurrent symptoms. Denies dysphagia.  Hyperlipidemia follow-up: Patient is reportedly following a low-fat, low cholesterol diet. Compliant with medications and denies medication side effects   Depression--well controlled on zoloft.  Dose was decreased to 50mg  last month. Has gone off meds in the past, and had recurrence of depression, some of which patient relates to work stress. She denies any side effects of medication.  There are 5 people in her family who committed suicide, and she is very hesitant about stopping the medication again.  She has not noted any change in moods since lowering the dose.  She hasn't noted any significant change in tremor with lower dose.  Smoker--tried Chantix in 2010, but had weird hallucinatory dreams. She has been cutting back, and plans to quit by her son's birthday in September.  She is down to only 3 cigarettes/day (a pack lasts about a week).  AWV:  Others doctors providing care for patient:  Dentist (Dr. Adria Devonandy Lewis)  Ophtho (Dr. Dagoberto LigasStoneburner)  GI ([facility])  Neuro (Dr. Frances FurbishAthar)  End of Life issues:  She already has Living Will and healthcare power of attorney  Depression screen: Normal--see scanned form  ADL screen--normal. See scanned form.  No falls in the last year.  Occasional trouble hearing, especially if people speak quickly.  Immunization History  Administered Date(s) Administered  . Influenza Split 08/16/2012  . Pneumococcal Conjugate-13 04/17/2010  . Td 10/16/2000  . Tdap 04/17/2010  . Zoster 03/03/2007  she [facility] Last Pap smear: 03/2011  Last mammogram: 02/2014 Last colonoscopy: 8/09  Last DEXA: 10/2004 --never got last year as was recommended Exercise: walks 3-4/week (9/10 of a mile); uses elliptical 5-10 mins twice daily.  Plans to start swimming Goes to dentist every 6 months  ophtho every 2 years, went recently.  Past Medical History  Diagnosis Date  . GERD (gastroesophageal reflux disease)   . Tremor, essential   . Migraine   . Depression   . History of endometriosis   . Elevated cholesterol 01/2005  . Elevated triglycerides with high cholesterol 01/2005  . Elevated C-reactive protein (CRP) 01/2005   Past Surgical History  Procedure Laterality Date  . Colonoscopy  05/26/2008  . Uterine fibroid surgery  1977  . Appendectomy  1977  . Laparoscopy  1981    endometriosis  . Abdominal adhesion surgery  1981  . R breast lump  1992    benign  . Tonsilectomy, adenoidectomy, bilateral myringotomy and tubes  1953    age 89  . Plastic surgery-neck  2008   History   Social History  .  Marital Status: Divorced    Spouse Name: N/A    Number of Children: 1  . Years of Education: college   Occupational History  . 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 Smoking status: Current Every Day Smoker -- 0.25 packs/day for 30 years    Types: Cigarettes  . Smokeless tobacco: Never Used     Comment: 3/day cigarettes/day, cutting back; quit date set for 06/24/14  . Alcohol Use: 0.0 oz/week     Comment: rarely,  1-2 times per year   . Drug Use: No  . Sexual Activity: Not Currently   Other Topics Concern  . Not on file   Social History Narrative   Lives alone.  Son lives in [place], 4 grandchildren   College Education   Right handed   Caffeine one cup daily.   Family History  Problem Relation Age of Onset  . Diabetes Mother   . Cancer Father     throat  . Heart disease Father 46  . Stroke Father 67  . Depression Brother   . Cancer Maternal Aunt     mult aunts with CA--uterine, stomach  . Alzheimer's disease Maternal Uncle   . Depression Maternal Uncle     suicide    Outpatient Encounter Prescriptions as of 05/27/2014  Medication Sig  . Calcium Carbonate-Vitamin D (CALCIUM + D) 600-200 MG-UNIT TABS Take 1,200 mg by mouth daily.    . clonazePAM (KLONOPIN) 1 MG tablet Take 1 mg by mouth 2 (two) times daily.  . CRESTOR 40 MG tablet TAKE 1 TABLET BY MOUTH EVERY DAY  . nadolol (CORGARD) 40 MG tablet TAKE 1 TABLET BY MOUTH EVERY MORNING AND THEN TAKE 2 TABLETS BY MOUTH EVERY EVENING  . omeprazole (PRILOSEC) 20 MG capsule Take 20 mg by mouth daily.    . sertraline (ZOLOFT) 50 MG tablet Take 1 tablet by mouth daily  . [DISCONTINUED] clonazePAM (KLONOPIN) 1 MG tablet Take 1 tablet (1 mg total) by mouth daily.  . [DISCONTINUED] naproxen (NAPROSYN) 500 MG tablet Take 1 tablet (500 mg total) by mouth 2 (two) times daily with a meal. Take as directed, as needed for pain   No Known Allergies  ROS: The patient denies anorexia, fever, headaches, vision changes, decreased hearing, ear pain, sore throat, breast concerns, chest pain, palpitations, dizziness, syncope, dyspnea on exertion, cough, swelling, nausea, vomiting, diarrhea, constipation, abdominal pain, melena, hematochezia, indigestion/heartburn, hematuria, incontinence, dysuria, vaginal bleeding, discharge, odor or itch, genital lesions, joint pains, numbness, tingling, weakness, depression, anxiety, abnormal bleeding/bruising, or enlarged lymph  nodes. See HPI   PHYSICAL EXAM:  BP 122/70  Pulse 68  Ht 5\' 5"  (1.651 m)  Wt 172 lb (78.019 kg)  BMI 28.62 kg/m2  General Appearance:  Alert, cooperative, no distress, appears stated age   Head:  Normocephalic, without obvious abnormality, atraumatic   Eyes:  PERRL, conjunctiva/corneas clear, EOM's intact, fundi  benign   Ears:  Normal TM's and external ear canals   Nose:  Nares normal, mucosa normal, no drainage or sinus tenderness   Throat:  Lips, mucosa, and tongue normal; teeth and gums normal.   Neck:  Supple, no lymphadenopathy; thyroid: no enlargement/tenderness/nodules; no carotid  bruit or JVD   Back:  Spine nontender, no curvature, ROM normal, no CVA tenderness   Lungs:  Clear to auscultation bilaterally without wheezes, rales or ronchi; respirations unlabored   Chest Wall:  No tenderness or deformity   Heart:  Regular rate and  rhythm, S1 and S2 normal, no murmur, rub  or gallop   Breast Exam:  No tenderness, masses, or nipple discharge or inversion. No axillary lymphadenopathy   Abdomen:  Soft, non-tender, nondistended, normoactive bowel sounds,  no masses, no hepatosplenomegaly   Genitalia:  Normal external genitalia without lesions. BUS and vagina normal; cervix is normal without lesions or discharge; no cervical motion tenderness. No abnormal vaginal discharge. Uterus and adnexa not enlarged, nontender, no masses. Pap performed   Rectal:  Normal tone, no masses or tenderness; guaiac negative stool   Extremities:  No clubbing, cyanosis or edema   Pulses:  2+ and symmetric all extremities   Skin:  Skin color, texture, turgor normal, no rashes or lesions. 2 nodules L leg (one upper/lateral, measuring 5-51mm, slightly pink, firm, one medially on lower portion of upper left leg, measuring 3-71mm, flesh colored)-no significant changes.  Lymph nodes:  Cervical, supraclavicular, and axillary nodes normal   Neurologic:  CNII-XII intact, normal strength, sensation and gait;  reflexes 2+ and symmetric throughout.  Mild tremor of head and hands noted         Psych: Normal mood, affect, hygiene and grooming.   ASSESSMENT/PLAN:  Medicare annual wellness visit, subsequent  Tobacco use disorder - cutting back, with quit date set for her son's birthday in September  Benign essential tremor - stable/controlled.  Didn't tolerate propranolol, so back on nadolol and clonazepam regimen  Mixed hyperlipidemia - Plan: Lipid panel, Comprehensive metabolic panel  Depression, major, in remission - doing well on 50mg  dose--plan to continue longterm, given family h/o suicide, although can consider further taper if she desires - Plan: TSH  Gastroesophageal reflux disease, esophagitis presence not specified - improved; rare omeprazole use  Need for prophylactic vaccination against Streptococcus pneumoniae (pneumococcus) - Plan: Pneumococcal conjugate vaccine 13-valent  Routine gynecological examination - Plan: Cytology - PAP [place]  Encounter for long-term (current) use of other medications - Plan: Lipid panel, Comprehensive metabolic panel, CBC with Differential, TSH  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 She plans to schedule appt with derm (but said that last couple of years and hasn't gone).   Depression:  Early on we briefly discussed the potential to further taper down to 25mg  if she desired (not tapering OFF, but just to 25mg  for 2 months at least). She then commented on her anxiety about stopping, given that there were 5 suicides in her family; she does not need to taper further if not desired. I am fine with her staying on the 50mg  longterm.  There didn't appear to be any benefit in the tremor by lowering the dose.  Discussed monthly self breast exams and yearly mammograms after the age of 10; at least 30 minutes of aerobic activity at least 5 days/week; proper sunscreen use reviewed; healthy diet, including  goals of calcium and vitamin D intake and alcohol recommendations (less than or equal to 1 drink/day) reviewed; regular seatbelt use; changing batteries in smoke detectors. Immunization recommendations discussed--UTD. She had pneumovax prior to age of 28 due to her being a smoker. Prevnar-13 today.  Colonoscopy recommendations reviewed--UTD   End of Life discussion: She already has Living Will and healthcare power of attorney. MOST form was reviewed and updated today. F/u 6 month med check, sooner prn

## 2014-05-27 NOTE — Patient Instructions (Signed)
  HEALTH MAINTENANCE RECOMMENDATIONS:  It is recommended that you get at least 30 minutes of aerobic exercise at least 5 days/week (for weight loss, you may need as much as 60-90 minutes). This can be any activity that gets your heart rate up. This can be divided in 10-15 minute intervals if needed, but try and build up your endurance at least once a week.  Weight bearing exercise is also recommended twice weekly.  Eat a healthy diet with lots of vegetables, fruits and fiber.  "Colorful" foods have a lot of vitamins (ie green vegetables, tomatoes, red peppers, etc).  Limit sweet tea, regular sodas and alcoholic beverages, all of which has a lot of calories and sugar.  Up to 1 alcoholic drink daily may be beneficial for women (unless trying to lose weight, watch sugars).  Drink a lot of water.  Calcium recommendations are 1200-1500 mg daily (1500 mg for postmenopausal women or women without ovaries), and vitamin D 1000 IU daily.  This should be obtained from diet and/or supplements (vitamins), and calcium should not be taken all at once, but in divided doses.  Monthly self breast exams and yearly mammograms for women over the age of 67 is recommended.  Sunscreen of at least SPF 30 should be used on all sun-exposed parts of the skin when outside between the hours of 10 am and 4 pm (not just when at beach or pool, but even with exercise, golf, tennis, and yard work!)  Use a sunscreen that says "broad spectrum" so it covers both UVA and UVB rays, and make sure to reapply every 1-2 hours.  Remember to change the batteries in your smoke detectors when changing your clock times in the spring and fall.  Use your seat belt every time you are in a car, and please drive safely and not be distracted with cell phones and texting while driving.  Please call [facility] to schedule your bone density test (they will need to fax an order for me to sign).  Dermatologists that I am familiar with are Drs. Danella DeisGruber and [facility]  (on [address], near [address]), [facility] (off of [address], near the [address]) and Dr. Terri PiedraLupton, down the street from my office.

## 2014-05-29 ENCOUNTER — Encounter: Payer: Self-pay | Admitting: Family Medicine

## 2014-05-29 LAB — CYTOLOGY - PAP

## 2014-07-14 ENCOUNTER — Encounter: Payer: Self-pay | Admitting: Neurology

## 2014-07-14 ENCOUNTER — Ambulatory Visit (INDEPENDENT_AMBULATORY_CARE_PROVIDER_SITE_OTHER): Payer: Medicare Other | Admitting: Neurology

## 2014-07-14 VITALS — BP 120/80 | HR 55 | Temp 98.1°F | Ht 62.0 in | Wt 173.0 lb

## 2014-07-14 DIAGNOSIS — G252 Other specified forms of tremor: Secondary | ICD-10-CM | POA: Diagnosis not present

## 2014-07-14 DIAGNOSIS — G250 Essential tremor: Secondary | ICD-10-CM

## 2014-07-14 NOTE — Patient Instructions (Signed)
  Please remember, that any kind of tremor may be exacerbated by anxiety, anger, nervousness, excitement, dehydration, sleep deprivation, by caffeine, and low blood sugar values or blood sugar fluctuations. Some medications, especially some antidepressants and lithium can cause or exacerbate tremors. Tremors may temporarily calm down her subside with the use of a benzodiazepine such as Valium or related medications and with alcohol. Be aware however that drinking alcohol is not an approved treatment or appropriate treatment for tremor control and long-term use of benzodiazepines such as Valium, lorazepam, alprazolam, or clonazepam can cause habit formation, physical and psychological addiction.  Your exam is stable.

## 2014-07-14 NOTE — Progress Notes (Signed)
Subjective:    [name] ID: Lindsey [name] is a 68 y.o. female.  HPI    Interim history:  Lindsey [name] is a very pleasant 68 year old right-handed woman with an underlying medical history of reflux disease, migraine headaches, depression, hyperlipidemia, status post appendectomy, tonsillectomy and adenoidectomy, as well as laparoscopic surgery for endometriosis, who presents for followup consultation of her essential tremor. She is unaccompanied today. I first met her on 01/08/2014 at Lindsey request of her primary care physician, at which time she reported a long-standing history of essential tremor. She used to see Dr. Felecia Shelling in [facility] in Lindsey past, but had not seen any neurologist in years. At Lindsey time of her first visit with me I suggested changing her nadolol to Inderal LA 80 mg once daily. I did keep her on her clonazepam, but suggested we consider tapering Lindsey clonazepam in Lindsey long run.   Today, she reports that she could not tolerate it in a row. She went back to nadolol and continues with clonazepam.  She was stable with with clonazepam and nadolol for years. She retired in 2011. She also saw Dr. Erling Cruz in or around 2011 and was tested for MS, but was told she did not have MS. She could not tolerate mysoline, which made her sick. Nadolol is expensive for. She has no FHx of ET, but there is a FHx of dementia and possibly PD. Her symptoms started in Lindsey 90s and she had a head tremor and bilateral hand tremors, and she feels she has progressed. She takes nadolol 40 mg in AM and 80 mg at night. Dr. Erling Cruz encouraged her to come off of clonazepam. She tried other meds, but no gabapentin or topamax. She has not been on propranolol. She rarely drinks alcohol. She smokes about 1/2 ppd. She is trying to quit smoking.   Her Past Medical History Is Significant For: Past Medical History  Diagnosis Date  . GERD (gastroesophageal reflux disease)   . Tremor, essential   . Migraine   . Depression   .  History of endometriosis   . Elevated cholesterol 01/2005  . Elevated triglycerides with high cholesterol 01/2005  . Elevated C-reactive protein (CRP) 01/2005    Her Past Surgical History Is Significant For: Past Surgical History  Procedure Laterality Date  . Colonoscopy  05/26/2008  . Uterine fibroid surgery  1977  . Appendectomy  1977  . Laparoscopy  1981    endometriosis  . Abdominal adhesion surgery  1981  . R breast lump  1992    benign  . Tonsilectomy, adenoidectomy, bilateral myringotomy and tubes  1953    age 50  . Plastic surgery-neck  2008    Her Family History Is Significant For: Family History  Problem Relation Age of Onset  . Diabetes Mother   . Cancer Father     throat  . Heart disease Father 2  . Stroke Father 76  . Depression Brother   . Cancer Maternal Aunt     mult aunts with CA--uterine, stomach  . Alzheimer's disease Maternal Uncle   . Depression Maternal Uncle     suicide    Her Social History Is Significant For: History   Social History  . Marital Status: Divorced    Spouse Name: N/A    Number of Children: 1  . Years of Education: college   Occupational History  . 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 Smoking status: Current Every Day  Smoker -- 0.25 packs/day for 30 years    Types: Cigarettes  . Smokeless tobacco: Never Used     Comment: 3/day cigarettes/day, cutting back; quit date set for 06/24/14  . Alcohol Use: 0.0 oz/week     Comment: rarely, 1-2 times per year   . Drug Use: No  . Sexual Activity: Not Currently   Other Topics Concern  . None   Social History Narrative   Lives alone.  Son lives in [place], 4 grandchildren   College Education   Right handed   Caffeine one cup daily.    Her Allergies Are:  No Known Allergies:   Her Current Medications Are:  Outpatient Encounter Prescriptions as of 07/14/2014  Medication Sig  . aspirin 81 MG tablet Take 81 mg by mouth daily.  . Calcium  Carbonate-Vitamin D (CALCIUM + D) 600-200 MG-UNIT TABS Take 1,200 mg by mouth daily.    . clonazePAM (KLONOPIN) 1 MG tablet Take 1 mg by mouth 2 (two) times daily.  . CRESTOR 40 MG tablet TAKE 1 TABLET BY MOUTH EVERY DAY  . Diphenhydramine-APAP, sleep, (TYLENOL PM EXTRA STRENGTH PO) Take 200 mg by mouth 2 (two) times daily.  . nadolol (CORGARD) 40 MG tablet TAKE 1 TABLET BY MOUTH EVERY MORNING AND THEN TAKE 2 TABLETS BY MOUTH EVERY EVENING  . omeprazole (PRILOSEC) 20 MG capsule Take 20 mg by mouth daily.    . sertraline (ZOLOFT) 50 MG tablet Take 1 tablet by mouth daily  :  Review of Systems:  Out of a complete 14 point review of systems, all are reviewed and negative with Lindsey exception of these symptoms as listed below:  Review of Systems  Neurological: Positive for tremors.    Objective:  Neurologic Exam  Physical Exam Physical Examination:   Filed Vitals:   07/14/14 1205  BP: 120/80  Pulse: 55  Temp: 98.1 F (36.7 C)   General Examination: Lindsey [name] is a very pleasant 68 y.o. female in no acute distress. She appears well-developed and well-nourished and well groomed.   HEENT: Normocephalic, atraumatic, pupils are equal, round and reactive to light and accommodation. She has mild b/l cataracts. Funduscopy is normal bilaterally. Extraocular tracking is good without limitation to gaze excursion or nystagmus noted. Normal smooth pursuit is noted. Hearing is grossly intact. Face is symmetric with mild decrease in facial animation and normal facial sensation. Speech is clear with no dysarthria noted. There is no hypophonia. There is a mild yes-yes type head tremor and a slight voice tremor, stable. Neck is supple with full range of passive and active motion. There are no carotid bruits on auscultation. Oropharynx exam reveals: mild mouth dryness, adequate dental hygiene and mild airway crowding, due to thicker soft palate and larger uvula and elongated tongue. Mallampati is class II.  Tongue protrudes centrally and palate elevates symmetrically. Tonsils are absent.   Chest: Clear to auscultation without wheezing, rhonchi or crackles noted.  Heart: S1+S2+0, regular and normal without murmurs, rubs or gallops noted.   Abdomen: Soft, non-tender and non-distended with normal bowel sounds appreciated on auscultation.  Extremities: There is no pitting edema in Lindsey distal lower extremities bilaterally. Pedal pulses are intact.  Skin: Warm and dry without trophic changes noted. There are no varicose veins.  Musculoskeletal: exam reveals no obvious joint deformities, tenderness or joint swelling or erythema.   Neurologically:  Mental status: Lindsey [name] is awake, alert and oriented in all 4 spheres. Her immediate and remote memory, attention, language skills and fund  of knowledge are appropriate. There is no evidence of aphasia, agnosia, apraxia or anomia. Speech is clear with normal prosody and enunciation. Thought process is linear. Mood is normal and affect is normal.  Cranial nerves II - XII are as described above under HEENT exam. In addition: shoulder shrug is normal with equal shoulder height noted. Motor exam: Normal bulk, strength and tone is noted. There is no drift, or rebound. There is no resting tremor. There is a bilateral upper extremity postural and action tremor, which is very minimal in degree. There tremor frequency is fairly fast and Lindsey amplitude is small. Handwriting is mildly tremulous, but legible. There is no evidence of micrographia.  Romberg is negative. Reflexes are 2+ throughout. Babinski: Toes are flexor bilaterally. Fine motor skills and coordination: intact with normal finger taps, normal hand movements, normal rapid alternating patting, normal foot taps and normal foot agility.  Cerebellar testing: No dysmetria or intention tremor on finger to nose testing. Heel to shin is unremarkable bilaterally. There is no truncal or gait ataxia.  Sensory exam:  intact to light touch, pinprick, vibration, temperature sense in Lindsey upper and lower extremities.  Gait, station and balance: She stands easily. No veering to one side is noted. No leaning to one side is noted. Posture is age-appropriate and stance is narrow based. Gait shows normal stride length and normal pace. No problems turning are noted. She turns en bloc. Tandem walk is quite difficult for her and so is toe and heel stance.               Assessment and Plan:   In summary, Lindsey [name] is a very pleasant 68 year old female with an underlying medical history of reflux disease, migraine headaches, depression, hyperlipidemia, status post appendectomy, tonsillectomy and adenoidectomy, as well as laparoscopic surgery for endometriosis, who has a longstanding history of ET, affecting primarily her head and to a lesser degree her hands and her voice. Her physical exam is stable and I reassured her in that regard. She could not tolerate Inderal and went back to nadolol and is still on clonazepam at Lindsey same dose. I again discussed with Lindsey [name] Lindsey diagnosis of ET, Lindsey prognosis and treatment options. We talked about medical treatments and non-pharmacological approaches.  She is on symptomatic treatment. She is stable and she is advised that she can followup with her primary care physician from here on onwards. She was in agreement. We also talked about maintaining a healthy lifestyle in general. I encouraged Lindsey [name] to eat healthy, exercise daily and keep well hydrated, to keep a scheduled bedtime and wake time routine, to not skip any meals and eat healthy snacks in between meals and to have protein with every meal. I answered all her questions today and Lindsey [name] was in agreement with Lindsey above outlined plan.

## 2014-07-26 ENCOUNTER — Other Ambulatory Visit: Payer: Self-pay | Admitting: Family Medicine

## 2014-07-28 DIAGNOSIS — Z23 Encounter for immunization: Secondary | ICD-10-CM | POA: Diagnosis not present

## 2014-08-17 ENCOUNTER — Encounter: Payer: Self-pay | Admitting: Neurology

## 2014-10-23 ENCOUNTER — Other Ambulatory Visit: Payer: Self-pay | Admitting: Family Medicine

## 2014-11-02 ENCOUNTER — Other Ambulatory Visit: Payer: Self-pay | Admitting: Family Medicine

## 2014-11-02 NOTE — Telephone Encounter (Signed)
It looks like she was d/c'd from neuro, to f/u here for her refills.  She doesn't have med check set.  Please schedule (last seen August, should be 6 month). Okay to refill once only (will give add'l refills at med check)

## 2014-11-02 NOTE — Telephone Encounter (Signed)
Is this okay?

## 2014-12-14 ENCOUNTER — Encounter: Payer: Self-pay | Admitting: Family Medicine

## 2014-12-14 ENCOUNTER — Ambulatory Visit (INDEPENDENT_AMBULATORY_CARE_PROVIDER_SITE_OTHER): Payer: PPO | Admitting: Family Medicine

## 2014-12-14 VITALS — BP 124/74 | HR 60 | Ht 65.0 in | Wt 166.0 lb

## 2014-12-14 DIAGNOSIS — K219 Gastro-esophageal reflux disease without esophagitis: Secondary | ICD-10-CM

## 2014-12-14 DIAGNOSIS — E782 Mixed hyperlipidemia: Secondary | ICD-10-CM

## 2014-12-14 DIAGNOSIS — G250 Essential tremor: Secondary | ICD-10-CM

## 2014-12-14 DIAGNOSIS — F324 Major depressive disorder, single episode, in partial remission: Secondary | ICD-10-CM

## 2014-12-14 DIAGNOSIS — F17200 Nicotine dependence, unspecified, uncomplicated: Secondary | ICD-10-CM

## 2014-12-14 DIAGNOSIS — Z720 Tobacco use: Secondary | ICD-10-CM

## 2014-12-14 DIAGNOSIS — F325 Major depressive disorder, single episode, in full remission: Secondary | ICD-10-CM

## 2014-12-14 MED ORDER — ROSUVASTATIN CALCIUM 40 MG PO TABS: 40.0000 mg | ORAL_TABLET | Freq: Every day | ORAL | Status: DC

## 2014-12-14 NOTE — Progress Notes (Signed)
Chief Complaint  Patient presents with  . Hyperlipidemia    fasting med check. Crestor is very expensive asking for generic lipitor. Nadolol is also very expensive and wants to know if there is something else she can take in place of.    Hyperlipidemia:  She bought 2 bottles of fish oil, but admits she hasn't started taking them. She is eating healthier now.  She is waiting for today's results to see if she needs to start taking them. Patient is reportedly following a low-fat, low cholesterol diet. Compliant with medications and denies medication side effects. She is concerned about the cost of Crestor--she can afford the copays, but she is hitting the donut hole after 6months, and having to pay a lot out of pocket after that. Her two most expensive medications are Crestor and nadolol.  Tremor--well controlled with nadolol and clonazepam. Taking 1 BID of the clonazepam. It is effective using it this way, without sedation. She saw neurologist in March 2015, who suggested changing from Nadolol to Propranolol, but she didn't tolerate this due to stomach upset (diarrhea). She went back to the nadolol, and is doing fine.  She is concerned about the cost of nadolol. Prior to this regimen, she tried mysoline, but didn't tolerate it.  [facility], but last year [facility].  GERD--she stopped taking omeprazole about a week ago (amidst news reports of dementia risk). Denies dysphagia. So far, no recurrent symptoms.   Depression--well controlled.  She has been tapering down from 100mg  of Zoloft, and is currently on 25mg  for the last couple of weeks.  No recurrent symptoms. Has gone off meds in the past, and had recurrence of depression, some of which patient relates to work stress. She denies any side effects of medication. There are 5 people in her family who committed suicide, and she originally had been hesitant about stopping the medication again. She has not noted  any change in moods since lowering the dose. She hasn't noted any significant change in tremor with lower dose. She plans to taper off completely.  Smoker--tried Chantix in 2010, but had weird hallucinatory dreams. She has been cutting back (she did not quit by her son's birthday as she hoped). She is still only smoking 3 cigarettes/day and plans to stop by March 10th.  PMH, PSH, SH reviewed.  Outpatient Encounter Prescriptions as of 12/14/2014  Medication Sig Note  . aspirin 81 MG tablet Take 81 mg by mouth daily.   . Calcium Carbonate-Vitamin D (CALCIUM + D) 600-200 MG-UNIT TABS Take 1,200 mg by mouth daily.     . clonazePAM (KLONOPIN) 1 MG tablet TAKE 1 TABLET BY MOUTH TWICE DAILY   . CRESTOR 40 MG tablet TAKE 1 TABLET BY MOUTH EVERY DAY   . Diphenhydramine-APAP, sleep, (TYLENOL PM EXTRA STRENGTH PO) Take 200 mg by mouth at bedtime.    . nadolol (CORGARD) 40 MG tablet TAKE 1 TABLET BY MOUTH EVERY MORNING AND THEN TAKE 2 TABLETS BY MOUTH EVERY EVENING   . sertraline (ZOLOFT) 50 MG tablet Take 1 tablet by mouth daily (Patient taking differently: 25 mg. Take 1 tablet by mouth daily) 12/14/2014: She is tapering down from 100mg .  Taking 25 mg for a couple of weeks.  . [DISCONTINUED] omeprazole (PRILOSEC) 20 MG capsule Take 20 mg by mouth daily.      No Known Allergies  ROS:  Denies headaches, dizziness, URI symptoms, cough, shortness of breath, chest pain, palpitations, nausea,  vomiting, heartburn, diarrhea, dysphagia, urinary complaints, myalgias, bleeding, bruising, rash.  Tremor is stable.  Moods are good.  See HPI.  PHYSICAL EXAM: BP 124/74 mmHg  Pulse 60  Ht  (1.651 m)  Wt 166 lb (75.297 kg)  BMI 27.62 kg/m2 Well developed, pleasant female in good spirits, very mild tremor of head noted HEENT: PERRL, EOMI, conjunctiva clear, OP clear Neck: no lymphadenopathy, thyromegaly or bruit Heart: regular rate and rhythm Lungs: clear bilateraly Abdomen: soft, nontender, no  mass Extremities: no edema, 2+ pulse Neuro: alert and oriented. Cranial nerves intact. Normal strength, gait, sensation Psych: normal mood, affect, hygiene and grooming  ASSESSMENT/PLAN:  Mixed hyperlipidemia - continue Crestor (generics won't be as strong). we can help some with samples when in donut hole, and delay getting there by decreasing cost of beta blocker - Plan: Lipid panel, Hepatic function panel  Benign essential tremor - stable. due to cost of nadolol, plans to retry propranolol  Tobacco use disorder - still only at 3/day. She set quit date for 12/24/14  Gastroesophageal reflux disease, esophagitis presence not specified - reviewed risks of both PPI and untreated reflux. asymptomatic off meds.  reviewed diet, prn use  Depression, major, in remission - doing well with taper off meds so far. FHx suicide is concerning.  restart at first sign of any recurrent sx, but may maintain at lower dose   Quit date 3/10  Restart zoloft if any recurrent depression symptoms--discuss with family so they also recognize symptoms. Do not wait until you are very severely depressed before restarting the medication (restart at , taking 1/2 tablet for the first week).  Call the office when you are in your donut hole for some assistance with Crestor samples when in the donut hole.  Plan to check with Dr. Frances Furbish re: another beta blocker choice (as substitute for nadolol) if she doesn't tolerate retrying propranolol (had diarrhea in the past)  With any change in beta blocker, pt advised to check BP and pulse  GERD and PPI risks/benefits.  30 min visit, more than 1/2 spent counseling  F/u in 6 mos for CPE/med check (need to see what insurance will cover--if can do both the same day, vs separating CPE and med check by a week, and get labs done fasting with CPE--I'm not aware of how this insurance covers)

## 2014-12-14 NOTE — Patient Instructions (Signed)
  Quit date 3/10 (to stop smoking)--Good luck!  Restart zoloft if any recurrent depression symptoms--discuss with family so they also recognize symptoms. Do not wait until you are very severely depressed before restarting the medication (restart at 50mg , taking 1/2 tablet for the first week).

## 2014-12-15 LAB — LIPID PANEL
CHOL/HDL RATIO: 2.6 ratio
CHOLESTEROL: 155 mg/dL (ref 0–200)
HDL: 60 mg/dL (ref >=46)
LDL CALC: 59 mg/dL (ref 0–99)
TRIGLYCERIDES: 178 mg/dL — AB (ref <150)
VLDL: 36 mg/dL (ref 0–40)

## 2014-12-15 LAB — HEPATIC FUNCTION PANEL
ALT: 25 U/L (ref 0–35)
AST: 37 U/L (ref 0–37)
Albumin: 4.4 g/dL (ref 3.5–5.2)
Alkaline Phosphatase: 65 U/L (ref 39–117)
Bilirubin, Direct: 0.1 mg/dL (ref 0.0–0.3)
Indirect Bilirubin: 0.4 mg/dL (ref 0.2–1.2)
Total Bilirubin: 0.5 mg/dL (ref 0.2–1.2)
Total Protein: 6.7 g/dL (ref 6.0–8.3)

## 2014-12-16 ENCOUNTER — Telehealth: Payer: Self-pay | Admitting: *Deleted

## 2014-12-16 NOTE — Telephone Encounter (Signed)
Patient called in and states that you were going to change her from nadolol to propanol. She is currently taking 40mg  TID and she states the dosage on the propanolol would be 80mg  QD-she wants to make sure that you think these are the correct dosages, that they are equal. And then she needs called in to pharmacy.  She said that her neuro was Dr.Athar.

## 2014-12-16 NOTE — Telephone Encounter (Signed)
I thought she said she still had some at home, and that when she finished her current nadolol she would retry it. If she didn't like how she felt (recurrent side effects), then would have to check with Dr. Frances FurbishAthar to discuss another similar alternative. Technically, she is still a patient with neuro, so she could also be calling them and following up with them if not doing well with it (rather than calling me, to call her to see what to do next).  If she doesn't have any of the 80mg , okay to send in rx for #30 (it should be the extended release).  That should be a good dose for her.  Needs to monitor BP and pulse, as we discussed

## 2014-12-16 NOTE — Telephone Encounter (Signed)
Yes, you are correct-she still has about 2 weeks of the propanolol left. She will try once she finishes the nadolol and she will call when she needs a refill on either the propanol (because she did not have side effects) or the nadolol (because she did have side effects).

## 2015-01-26 ENCOUNTER — Other Ambulatory Visit: Payer: Self-pay | Admitting: Family Medicine

## 2015-01-26 NOTE — Telephone Encounter (Signed)
I called the patients medication per Dr. Delford FieldKnapp's orders

## 2015-01-26 NOTE — Telephone Encounter (Signed)
Is this okay to refill? 

## 2015-01-26 NOTE — Telephone Encounter (Signed)
Okay for #180 with 1 additional refill

## 2015-03-08 ENCOUNTER — Telehealth: Payer: Self-pay | Admitting: *Deleted

## 2015-03-08 MED ORDER — PROPRANOLOL HCL ER 80 MG PO CP24: 80.0000 mg | ORAL_CAPSULE | Freq: Every day | ORAL | Status: DC

## 2015-03-08 NOTE — Telephone Encounter (Signed)
Ok for inderal LA 80mg  #90. She has f/u in August

## 2015-03-08 NOTE — Telephone Encounter (Signed)
Done

## 2015-03-08 NOTE — Telephone Encounter (Signed)
Patient needs 90 days of propanolol 80mg , [facility] See message from 12/16/14-just want to make sure you are okay with this before I do. Thanks.

## 2015-06-16 ENCOUNTER — Encounter: Payer: Self-pay | Admitting: Family Medicine

## 2015-06-16 ENCOUNTER — Ambulatory Visit (INDEPENDENT_AMBULATORY_CARE_PROVIDER_SITE_OTHER): Payer: PPO | Admitting: Family Medicine

## 2015-06-16 VITALS — BP 120/72 | HR 60 | Ht 65.0 in | Wt 164.8 lb

## 2015-06-16 DIAGNOSIS — G250 Essential tremor: Secondary | ICD-10-CM | POA: Diagnosis not present

## 2015-06-16 DIAGNOSIS — F324 Major depressive disorder, single episode, in partial remission: Secondary | ICD-10-CM

## 2015-06-16 DIAGNOSIS — Z0000 Encounter for general adult medical examination without abnormal findings: Secondary | ICD-10-CM

## 2015-06-16 DIAGNOSIS — Z5181 Encounter for therapeutic drug level monitoring: Secondary | ICD-10-CM

## 2015-06-16 DIAGNOSIS — Z1159 Encounter for screening for other viral diseases: Secondary | ICD-10-CM | POA: Diagnosis not present

## 2015-06-16 DIAGNOSIS — Z01419 Encounter for gynecological examination (general) (routine) without abnormal findings: Secondary | ICD-10-CM

## 2015-06-16 DIAGNOSIS — Z720 Tobacco use: Secondary | ICD-10-CM

## 2015-06-16 DIAGNOSIS — Z23 Encounter for immunization: Secondary | ICD-10-CM

## 2015-06-16 DIAGNOSIS — F325 Major depressive disorder, single episode, in full remission: Secondary | ICD-10-CM

## 2015-06-16 DIAGNOSIS — E782 Mixed hyperlipidemia: Secondary | ICD-10-CM

## 2015-06-16 DIAGNOSIS — K219 Gastro-esophageal reflux disease without esophagitis: Secondary | ICD-10-CM

## 2015-06-16 DIAGNOSIS — F17200 Nicotine dependence, unspecified, uncomplicated: Secondary | ICD-10-CM

## 2015-06-16 LAB — HEMOCCULT GUIAC POC 1CARD (OFFICE): Fecal Occult Blood, POC: NEGATIVE

## 2015-06-16 LAB — POCT URINALYSIS DIPSTICK
Glucose, UA: NEGATIVE
Ketones, UA: NEGATIVE
NITRITE UA: NEGATIVE
PROTEIN UA: NEGATIVE
RBC UA: NEGATIVE
UROBILINOGEN UA: NEGATIVE
pH, UA: 5

## 2015-06-16 LAB — LIPID PANEL
CHOL/HDL RATIO: 2.9 ratio (ref <=5.0)
CHOLESTEROL: 167 mg/dL (ref 125–200)
HDL: 57 mg/dL (ref >=46)
LDL Cholesterol: 68 mg/dL (ref <130)
Triglycerides: 209 mg/dL — ABNORMAL HIGH (ref <150)
VLDL: 42 mg/dL — ABNORMAL HIGH (ref <30)

## 2015-06-16 LAB — COMPREHENSIVE METABOLIC PANEL
ALT: 18 U/L (ref 6–29)
AST: 26 U/L (ref 10–35)
Albumin: 5 g/dL (ref 3.6–5.1)
Alkaline Phosphatase: 68 U/L (ref 33–130)
BUN: 16 mg/dL (ref 7–25)
CO2: 27 mmol/L (ref 20–31)
Calcium: 9.9 mg/dL (ref 8.6–10.4)
Chloride: 103 mmol/L (ref 98–110)
Creat: 0.75 mg/dL (ref 0.50–0.99)
GLUCOSE: 103 mg/dL — AB (ref 65–99)
POTASSIUM: 4.5 mmol/L (ref 3.5–5.3)
Sodium: 140 mmol/L (ref 135–146)
Total Bilirubin: 0.6 mg/dL (ref 0.2–1.2)
Total Protein: 6.9 g/dL (ref 6.1–8.1)

## 2015-06-16 LAB — TSH: TSH: 1.089 u[IU]/mL (ref 0.350–4.500)

## 2015-06-16 MED ORDER — SERTRALINE HCL 50 MG PO TABS: 25.0000 mg | ORAL_TABLET | Freq: Every day | ORAL | Status: DC

## 2015-06-16 MED ORDER — PROPRANOLOL HCL ER 80 MG PO CP24: 80.0000 mg | ORAL_CAPSULE | Freq: Every day | ORAL | Status: DC

## 2015-06-16 MED ORDER — ROSUVASTATIN CALCIUM 40 MG PO TABS: 40.0000 mg | ORAL_TABLET | Freq: Every day | ORAL | Status: DC

## 2015-06-16 NOTE — Progress Notes (Signed)
Chief Complaint  Patient presents with  . Annual Exam    fasting annual exam with pelvic/med check. UA showed trace leuks and 1+ bili-no symptoms. No concerns. Would like to discuss smoking cessation.    Lindsey Frank is a 69 y.o. female who presents for annual wellness visit and follow-up on chronic medical conditions.  She has the following concerns:  Hyperlipidemia: She bought 2 bottles of fish oil 6 months ago, but hasn't started them.  Patient is reportedly following a low-fat, low cholesterol diet. Compliant with medications and denies medication side effects.  Tremor--She was changed from Nadolol to Propranolol for cost purposes.  The first time she tried this she had some diarrhea, but she has been tolerating the propranolol without any side effects. It seems to be as effective as the Nadolol was, along with the clonazepam, but she does still have some persistent tremor "that I hate". She previously tried mysoline, but didn't tolerate it ("sick all day after one pill").   GERD--She takes Prilosec OTC daily.  She once ran out and was off about a week, but had recurrent heartburn.  She is now taking it daily without any heartburn. Denies dysphagia.  Depression--well controlled. She has been taking just 46m of the Sertraline for the last 6 months. No recurrent symptoms. Has gone off meds in the past, and had recurrence of depression, some of which patient relates to work stress. She denies any side effects of medication. There are 5 people in her family who committed suicide, and she originally had been hesitant about stopping the medication again. She has not noted any change in moods since lowering the dose.   Smoker--tried Chantix in 2010, but had weird hallucinatory dreams. She quit smoking in March (son's birthday was her quit date).  Since then, she had 2 very stressful episodes (she is not wanting to talk about these today).  She was able to quit again about a month ago, but  can't guarantee that she won't restart with stressors again.   Immunization History  Administered Date(s) Administered  . Influenza Split 08/16/2012, 07/28/2014  . Pneumococcal Conjugate-13 04/17/2010, 05/27/2014  . Td 10/16/2000  . Tdap 04/17/2010  . Zoster 03/03/2007   Last Pap smear: 05/2014 Last mammogram: 02/2014 Last colonoscopy: 8/09  Last DEXA: 10/2004 --never got last year as was recommended (recommended the last 2 years); agreeable to schedule when she schedules her mammogram Exercise: walks 3-4/week (9/10 of a mile); uses elliptical 5-10 mins once daily. No weight-bearing exercise. Goes to dentist every 6 months  Ophtho: last year, plans to schedule this year.  Other doctors caring for patient include: Dentist (Dr. RVictorino Dike  Ophtho (Dr. SKathrin Penner  GI (Dr. HAmedeo Plentyat EVazquez 2009)  Neuro (Dr. ARexene Alberts  Depression screen ,Fall screen and Functional Assessment screens were all unremarkable--see epic questionnaires.  End of Life Discussion:  Patient has a living will and medical power of attorney. She was asked to give [place] copy for our records.  Past Medical History  Diagnosis Date  . GERD (gastroesophageal reflux disease)   . Tremor, essential   . Migraine   . Depression   . History of endometriosis   . Elevated cholesterol 01/2005  . Elevated triglycerides with high cholesterol 01/2005  . Elevated C-reactive protein (CRP) 01/2005    Past Surgical History  Procedure Laterality Date  . Colonoscopy  05/26/2008  . Uterine fibroid surgery  1977  . Appendectomy  1977  . Laparoscopy  12229  endometriosis  . Abdominal adhesion surgery  1981  . R breast lump  1992    benign  . Tonsilectomy, adenoidectomy, bilateral myringotomy and tubes  1953    age 85  . Plastic surgery-neck  2008    Social History   Social History  . Marital Status: Divorced    Spouse Name: N/A  . Number of Children: 1  . Years of Education: college   Occupational History  . 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 Smoking status: Former Smoker -- 0.25 packs/day for 30 years    Types: Cigarettes  . Smokeless tobacco: Former Systems developer    Quit date: 05/16/2015     Comment: quit 12/2014, relapsed with stress, quit again end of July 2016  . Alcohol Use: 0.0 oz/week    0 Standard drinks or equivalent per week     Comment: Lindsey Frank once a week  . Drug Use: No  . Sexual Activity: Not Currently   Other Topics Concern  . Not on file   Social History Narrative   Lives alone.  Son lives in [place], 4 grandchildren   College Education   Right handed   Caffeine one cup daily.    Family History  Problem Relation Age of Onset  . Diabetes Mother   . Cancer Father     throat  . Heart disease Father 44  . Stroke Father 26  . Depression Brother   . Cancer Maternal Aunt     mult aunts with CA--uterine, stomach  . Alzheimer's disease Maternal Uncle   . Depression Maternal Uncle     suicide    Outpatient Encounter Prescriptions as of 06/16/2015  Medication Sig Note  . aspirin 81 MG tablet Take 81 mg by mouth daily.   . Calcium Carbonate-Vitamin D (CALCIUM + D) 600-200 MG-UNIT TABS Take 1,200 mg by mouth daily.     . clonazePAM (KLONOPIN) 1 MG tablet TAKE 1 TABLET BY MOUTH TWICE DAILY   . Diphenhydramine-APAP, sleep, (TYLENOL PM EXTRA STRENGTH PO) Take 200 mg by mouth at bedtime.    Marland Kitchen omeprazole (PRILOSEC OTC) 20 MG tablet Take 20 mg by mouth daily.   . propranolol ER (INDERAL LA) 80 MG 24 hr capsule Take 1 capsule (80 mg total) by mouth daily.   . rosuvastatin (CRESTOR) 40 MG tablet Take 1 tablet (40 mg total) by mouth daily.   . sertraline (ZOLOFT) 50 MG tablet Take 0.5 tablets (25 mg total) by mouth daily.   . [DISCONTINUED] nadolol (CORGARD) 40 MG tablet TAKE 1 TABLET BY MOUTH EVERY MORNING AND THEN TAKE 2 TABLETS BY MOUTH EVERY EVENING (Patient not taking: Reported on 06/16/2015)   . [DISCONTINUED] propranolol ER (INDERAL LA) 80 MG 24 hr  capsule Take 1 capsule (80 mg total) by mouth daily.   . [DISCONTINUED] rosuvastatin (CRESTOR) 40 MG tablet Take 1 tablet (40 mg total) by mouth daily.   . [DISCONTINUED] sertraline (ZOLOFT) 50 MG tablet Take 1 tablet by mouth daily (Patient taking differently: 25 mg. Take 1 tablet by mouth daily) 06/16/2015: Cut back from 61m to 237mabout 6 months ago   No facility-administered encounter medications on file as of 06/16/2015.    No Known Allergies   ROS: The patient denies anorexia, fever, headaches, vision changes, decreased hearing, ear pain, sore throat, breast concerns, chest pain, palpitations, dizziness, syncope, dyspnea on exertion, cough, swelling, nausea, vomiting, diarrhea, constipation, abdominal pain, melena, hematochezia, indigestion/heartburn, hematuria, incontinence, dysuria,  vaginal bleeding, discharge, odor or itch, genital lesions, joint pains, numbness, tingling, weakness, depression, anxiety, abnormal bleeding/bruising, or enlarged lymph nodes. Some postnasal drainage, chronic (since childhood--doesn't desire treatment). She has maintained her weight loss, and is down another pound over the last month.  PHYSICAL EXAM:  BP 120/72 mmHg  Pulse 60  Ht _0  (1.651 m)  Wt 164 lb 12.8 oz (74.753 kg)  BMI 27.42 kg/m2   General Appearance:  Alert, cooperative, no distress, appears stated age. Some throat-clearing during visit.  Head:  Normocephalic, without obvious abnormality, atraumatic   Eyes:  PERRL, conjunctiva/corneas clear, EOM's intact, fundi  benign   Ears:  Normal TM's and external ear canals   Nose:  Nares normal, mucosa normal, no drainage or sinus tenderness   Throat:  Lips, mucosa, and tongue normal; teeth and gums normal.   Neck:  Supple, no lymphadenopathy; thyroid: no enlargement/tenderness/nodules; no carotid  bruit or JVD   Back:  Spine nontender, no curvature, ROM normal, no CVA tenderness   Lungs:  Clear to auscultation bilaterally  without wheezes, rales or ronchi; respirations unlabored   Chest Wall:  No tenderness or deformity   Heart:  Regular rate and rhythm, S1 and S2 normal, no murmur, rub  or gallop   Breast Exam:  No tenderness, masses, or nipple discharge or inversion. No axillary lymphadenopathy   Abdomen:  Soft, non-tender, nondistended, normoactive bowel sounds,  no masses, no hepatosplenomegaly   Genitalia:  Normal external genitalia without lesions. BUS and vagina normal; no cervical motion tenderness. No abnormal vaginal discharge. Uterus and adnexa not enlarged, nontender, no masses. Pap not performed   Rectal:  Normal tone, no masses or tenderness; guaiac negative stool   Extremities:  No clubbing, cyanosis or edema   Pulses:  2+ and symmetric all extremities   Skin:  Skin color, texture, turgor normal, no rashes or lesions.  Lymph nodes:  Cervical, supraclavicular, and axillary nodes normal   Neurologic:  CNII-XII intact, normal strength, sensation and gait; reflexes 2-3+ and symmetric throughout. Mild tremor of head and hands noted   Psych: Normal mood, affect, hygiene and grooming          ASSESSMENT/PLAN:  Medicare annual wellness visit, subsequent  Benign essential tremor - stable on propranolol and clonazepam regimen - Plan: propranolol ER (INDERAL LA) 80 MG 24 hr capsule, TSH  Mixed hyperlipidemia - Plan: rosuvastatin (CRESTOR) 40 MG tablet, Comprehensive metabolic panel, Lipid panel  Gastroesophageal reflux disease, esophagitis presence not specified - consider trial of tapering down on PPI (ie qod, vs prn)  Depression, major, in remission - Plan: sertraline (ZOLOFT) 50 MG tablet, TSH  Tobacco use disorder - congratulated on her efforts; discussed stress reduction techniques.  Annual physical exam - Plan: POCT Urinalysis Dipstick, Visual acuity screening  Need for prophylactic vaccination and inoculation against influenza - Plan: Flu  vaccine HIGH DOSE PF (Fluzone High dose)  Depression, major, in remission - stable on 47m. Continue longterm given Fhx suicide - Plan: sertraline (ZOLOFT) 50 MG tablet, TSH  Medication monitoring encounter - Plan: Comprehensive metabolic panel, Lipid panel  Need for hepatitis C screening test - Plan: Hepatitis C antibody   Discussed monthly self breast exams and yearly mammograms; at least 30 minutes of aerobic activity at least 5 days/week, weight bearing exercise 2x/week; proper sunscreen use reviewed; healthy diet, including goals of calcium and vitamin D intake and alcohol recommendations (less than or equal to 1 drink/day) reviewed; regular seatbelt use; changing batteries in smoke detectors.  Immunization recommendations discussed--high dose flu shot given. She had pneumovax prior to age of 55 due to her being a smoker, got Prevnar last year. Has been 5 years since her last pneumovax--declines today, will give next year. Colonoscopy recommendations reviewed--UTD   MOST form reviewed and updated today. Patient asked to bring copies of Living Will/healthcare POA.  Lipid, c-met, TSH  Rx given to get DEXA at [facility] re: PPI risks, consider trying qod or prn  Consider f/u in 1 year if labs totally normal, otherwise 6 months  Medicare Attestation I have personally reviewed: The patient's medical and social history Their use of alcohol, tobacco or illicit drugs Their current medications and supplements The patient's functional ability including ADLs,fall risks, home safety risks, cognitive, and hearing and visual impairment Diet and physical activities Evidence for depression or mood disorders  The patient's weight, height, BMI, and visual acuity have been recorded in the chart.  I have made referrals, counseling, and provided education to the patient based on review of the above and I have provided the patient with a written personalized care plan for preventive services.      Ilayda Toda A, MD   06/16/2015

## 2015-06-16 NOTE — Patient Instructions (Addendum)
HEALTH MAINTENANCE RECOMMENDATIONS:  It is recommended that you get at least 30 minutes of aerobic exercise at least 5 days/week (for weight loss, you may need as much as 60-90 minutes). This can be any activity that gets your heart rate up. This can be divided in 10-15 minute intervals if needed, but try and build up your endurance at least once a week.  Weight bearing exercise is also recommended twice weekly.  Eat a healthy diet with lots of vegetables, fruits and fiber.  "Colorful" foods have a lot of vitamins (ie green vegetables, tomatoes, red peppers, etc).  Limit sweet tea, regular sodas and alcoholic beverages, all of which has a lot of calories and sugar.  Up to 1 alcoholic drink daily may be beneficial for women (unless trying to lose weight, watch sugars).  Drink a lot of water.  Calcium recommendations are 1200-1500 mg daily (1500 mg for postmenopausal women or women without ovaries), and vitamin D 1000 IU daily.  This should be obtained from diet and/or supplements (vitamins), and calcium should not be taken all at once, but in divided doses.  Monthly self breast exams and yearly mammograms for women over the age of 7 is recommended.  Sunscreen of at least SPF 30 should be used on all sun-exposed parts of the skin when outside between the hours of 10 am and 4 pm (not just when at beach or pool, but even with exercise, golf, tennis, and yard work!)  Use a sunscreen that says "broad spectrum" so it covers both UVA and UVB rays, and make sure to reapply every 1-2 hours.  Remember to change the batteries in your smoke detectors when changing your clock times in the spring and fall.  Use your seat belt every time you are in a car, and please drive safely and not be distracted with cell phones and texting while driving.  Consider taking the prilosec every other day for a month, and if not having any recurrent symptoms, consider tapering back to just prior to the foods that typically will  cause heartburn (ie spicy, large meals, acidic/citrus, etc).  If you have recurrent symptoms even with just 1 skipped day, you may safely continue to take the prilosec every day.   Lindsey Frank , Thank you for taking time to come for your Medicare Wellness Visit. I appreciate your ongoing commitment to your health goals. Please review the following plan we discussed and let me know if I can assist you in the future.   These are the goals we discussed: Goals    None      This is a list of the screening recommended for you and due dates:  Health Maintenance  Topic Date Due  .  Hepatitis C: One time screening is recommended by Center for Disease Control  (CDC) for  adults born from [birth year] through 1964.   [birth year]  . Flu Shot  05/17/2015  . Pneumonia vaccines (2 of 2 - PPSV23) 05/28/2015  . Mammogram  03/03/2016  . Colon Cancer Screening  05/26/2018  . Tetanus Vaccine  04/17/2020  . DEXA scan (bone density measurement)  Completed  . Shingles Vaccine  Completed    Flu shot was given today. We are doing hepatitis C screen today. The second pneumonia vaccine--you prefer to wait until next year (and that is fine--has been 5 years since your last pneumovax, and you had the Prevnar last year). Another bone density is recommended--if this is normal, we do not need to continue  monitoring, but if there is bone thinning, additional tests may be needed.  Call to schedule your mammogram and bone density tests.

## 2015-06-17 LAB — HEPATITIS C ANTIBODY: HCV Ab: NEGATIVE

## 2015-06-23 ENCOUNTER — Other Ambulatory Visit: Payer: Self-pay | Admitting: Family Medicine

## 2015-06-24 ENCOUNTER — Other Ambulatory Visit: Payer: Self-pay | Admitting: Family Medicine

## 2015-07-26 ENCOUNTER — Other Ambulatory Visit: Payer: Self-pay | Admitting: Family Medicine

## 2015-07-26 NOTE — Telephone Encounter (Signed)
Okay for #180 with 1 refill

## 2015-07-26 NOTE — Telephone Encounter (Signed)
Is this okay to refill? 

## 2015-09-20 ENCOUNTER — Other Ambulatory Visit: Payer: Self-pay | Admitting: Family Medicine

## 2015-12-14 ENCOUNTER — Other Ambulatory Visit: Payer: Self-pay | Admitting: Family Medicine

## 2015-12-14 NOTE — Telephone Encounter (Signed)
Pt has an appt here in march

## 2015-12-21 DIAGNOSIS — R69 Illness, unspecified: Secondary | ICD-10-CM | POA: Diagnosis not present

## 2015-12-27 DIAGNOSIS — Z1231 Encounter for screening mammogram for malignant neoplasm of breast: Secondary | ICD-10-CM | POA: Diagnosis not present

## 2015-12-27 LAB — HM MAMMOGRAPHY: HM Mammogram: NEGATIVE

## 2015-12-30 ENCOUNTER — Ambulatory Visit (INDEPENDENT_AMBULATORY_CARE_PROVIDER_SITE_OTHER): Payer: Medicare HMO | Admitting: Family Medicine

## 2015-12-30 ENCOUNTER — Encounter: Payer: Self-pay | Admitting: Family Medicine

## 2015-12-30 VITALS — BP 120/70 | HR 64 | Ht 65.0 in | Wt 169.8 lb

## 2015-12-30 DIAGNOSIS — R7301 Impaired fasting glucose: Secondary | ICD-10-CM

## 2015-12-30 DIAGNOSIS — F325 Major depressive disorder, single episode, in full remission: Secondary | ICD-10-CM

## 2015-12-30 DIAGNOSIS — E782 Mixed hyperlipidemia: Secondary | ICD-10-CM | POA: Diagnosis not present

## 2015-12-30 DIAGNOSIS — G250 Essential tremor: Secondary | ICD-10-CM

## 2015-12-30 DIAGNOSIS — R69 Illness, unspecified: Secondary | ICD-10-CM | POA: Diagnosis not present

## 2015-12-30 LAB — LIPID PANEL
CHOLESTEROL: 150 mg/dL (ref 125–200)
HDL: 55 mg/dL (ref >=46)
LDL Cholesterol: 49 mg/dL (ref <130)
Total CHOL/HDL Ratio: 2.7 Ratio (ref <=5.0)
Triglycerides: 231 mg/dL — ABNORMAL HIGH (ref <150)
VLDL: 46 mg/dL — AB (ref <30)

## 2015-12-30 LAB — HEPATIC FUNCTION PANEL
ALT: 16 U/L (ref 6–29)
AST: 23 U/L (ref 10–35)
Albumin: 4.5 g/dL (ref 3.6–5.1)
Alkaline Phosphatase: 71 U/L (ref 33–130)
BILIRUBIN DIRECT: 0.1 mg/dL (ref <=0.2)
Indirect Bilirubin: 0.5 mg/dL (ref 0.2–1.2)
TOTAL PROTEIN: 7 g/dL (ref 6.1–8.1)
Total Bilirubin: 0.6 mg/dL (ref 0.2–1.2)

## 2015-12-30 LAB — GLUCOSE, RANDOM: Glucose, Bld: 109 mg/dL — ABNORMAL HIGH (ref 65–99)

## 2015-12-30 NOTE — Progress Notes (Signed)
Chief Complaint  Patient presents with  . Hyperlipidemia    fasting med check.    She has a boyfriend, and is planning for sexual relations--asking for advice.  Fasting sugar was elevated at 103 on her wellness visit 6 months ago. TG remained elevated, and was asked to start  of fish oil daily, in addition to her Crestor. She admits she still never started the fish oil that has been in her cabinet for a year now. She is eating more vegetables, cut back on portions of chocolate. Patient is reportedly following a low-fat, low cholesterol diet. Compliant with medications and denies medication side effects. She has been walking 1 mile a day.  She has gained 5# since her last visit.  Tremor--Tolerating the propranolol without any side effects, along with the clonazepam (previously took nadolol, changed due to cost). She does still have some persistent tremor "that I hate". She previously tried mysoline, but didn't tolerate it ("sick all day after one pill"). She is asking whether or not her tremor is bad, and whether she should feel embarrassed about it--concerned if it is very noticeable to others.   GERD--She takes Prilosec OTC daily. She once ran out and was off about a week, but had recurrent heartburn. She is now taking it daily without any heartburn. Hasn't missed any doses recently. Denies dysphagia.  Depression--well controlled. She had been taking just  of the Sertraline at her last visit.  She weaned off that, has been off for about 6 months, and has had no recurrent depressive symptoms. She had gone off meds in the past, and had recurrence of depression, some of which patient relates to work stress. There are 5 people in her family who committed suicide.  PMH, PSH, SH reviewed and updated. No changes, no changes to family history,  Outpatient Encounter Prescriptions as of 12/30/2015  Medication Sig  . aspirin 81 MG tablet Take 81 mg by mouth daily.  . Calcium  Carbonate-Vitamin D (CALCIUM + D) 600-200 MG-UNIT TABS Take 1,200 mg by mouth daily.    . clonazePAM (KLONOPIN) 1 MG tablet TAKE 1 TABLET BY MOUTH TWICE DAILY  . Diphenhydramine-APAP, sleep, (TYLENOL PM EXTRA STRENGTH PO) Take 200 mg by mouth at bedtime.   Marland Kitchen omeprazole (PRILOSEC OTC) 20 MG tablet Take 20 mg by mouth daily.  . propranolol ER (INDERAL LA) 80 MG 24 hr capsule GIVE "Dave" 1 CAPSULE(80 MG) BY MOUTH DAILY  . rosuvastatin (CRESTOR) 40 MG tablet Take 1 tablet (40 mg total) by mouth daily.  . [DISCONTINUED] CRESTOR 40 MG tablet TAKE 1 TABLET BY MOUTH EVERY DAY  . [DISCONTINUED] sertraline (ZOLOFT) 50 MG tablet Take 0.5 tablets (25 mg total) by mouth daily. (Patient not taking: Reported on 12/30/2015)   No facility-administered encounter medications on file as of 12/30/2015.   No Known Allergies  ROS: Denies fevers, chills, headaches, dizziness.  Hasn't had a migraine in years. No URI symptoms, cough, shortness of breath. No nausea, vomiting, diarrhea. No urinary complaints, chest pain, palpitations. Denies recurrent symptoms of depression. See HPI  PHYSICAL EXAM: BP 120/70 mmHg  Pulse 64  Ht  (1.651 m)  Wt 169 lb 12.8 oz (77.021 kg)  BMI 28.26 kg/m2  Well developed, pleasant female in good spirits.   HEENT: PERRL, EOMI, conjunctiva clear, OP clear Neck: no lymphadenopathy, thyromegaly or bruit Heart: regular rate and rhythm, no murmur Lungs: clear bilaterally Abdomen: soft, nontender, no mass Extremities: no edema, 2+ pulse Neuro: alert and oriented. Cranial nerves  intact. Normal strength, gait, sensation Minimal hand tremor noted when drinking water from water bottle.  Slight tremor of head is noticeable at rest. Psych: normal mood, affect, hygiene and grooming Skin: normal turgor, no visible rashes/lesions  ASSESSMENT/PLAN:  Mixed hyperlipidemia - recheck lipids. continue lowfat diet.  If TG remain elevated, she promises to start fish oil - Plan: Hepatic function  panel, Lipid panel  Benign essential tremor - stable on current regimen. Reassured that her symptoms are mild and she shouldn't be embarrassed  Depression, major, in remission (HCC) - she tapered off sertraline entirely, and doing well without recurrent symptoms  Impaired fasting glucose - lowfat, low sugar/carb diet, regular exercise and weight loss - Plan: Hemoglobin A1c, Glucose, random   Lft's, glucose, A1c, lipids.  Pt states office was contacted and recently refilled Crestor; also reports she recently filled clonazepam. She will contact pharmacy when refills are needed, none needed today  Advice re: condoms (for STD prevention), lubrication, taking it slow. If continues to be painful despite these measures, can consider topical estrogens.  Reassured re: tremor--mild. Continue current regimen.    Consider trying to skip a dose of prilosec and see if you get recurrent heartburn.  Since your risks for reflux have changed (less chocolate, not smoking), you might do okay without taking it daily.  If no recurrent symptoms after one missed day, consider trying every other day, and some people can take it just prior to meals that will trigger, vs continuing every other day, vs continuing it daily, if truly needed.   F/u 6 mos for AWV/med check+ (CPE)

## 2015-12-30 NOTE — Patient Instructions (Signed)
  Consider trying to skip a dose of prilosec and see if you get recurrent heartburn.  Since your risks for reflux have changed (less chocolate, not smoking), you might do okay without taking it daily.  If no recurrent symptoms after one missed day, consider trying every other day, and some people can take it just prior to meals that will trigger, vs continuing every other day, vs continuing it daily, if truly needed.  We will be in touch with your lab results, and to let you know about the fish oil. Continue to try and follow a lowfat diet, and lose the weight you gained.

## 2015-12-31 LAB — HEMOGLOBIN A1C
HEMOGLOBIN A1C: 6.1 % — AB (ref <5.7)
Mean Plasma Glucose: 128 mg/dL — ABNORMAL HIGH (ref <117)

## 2016-01-17 ENCOUNTER — Other Ambulatory Visit: Payer: Self-pay | Admitting: Family Medicine

## 2016-01-17 NOTE — Telephone Encounter (Signed)
Is this okay to refill? 

## 2016-01-17 NOTE — Telephone Encounter (Signed)
Yes (180 with 1 refill, as before)

## 2016-02-09 DIAGNOSIS — H11441 Conjunctival cysts, right eye: Secondary | ICD-10-CM | POA: Diagnosis not present

## 2016-02-09 DIAGNOSIS — H25813 Combined forms of age-related cataract, bilateral: Secondary | ICD-10-CM | POA: Diagnosis not present

## 2016-02-09 DIAGNOSIS — Z0100 Encounter for examination of eyes and vision without abnormal findings: Secondary | ICD-10-CM | POA: Diagnosis not present

## 2016-02-09 DIAGNOSIS — H01001 Unspecified blepharitis right upper eyelid: Secondary | ICD-10-CM | POA: Diagnosis not present

## 2016-03-20 ENCOUNTER — Other Ambulatory Visit: Payer: Self-pay | Admitting: Family Medicine

## 2016-06-13 ENCOUNTER — Other Ambulatory Visit: Payer: Self-pay | Admitting: Family Medicine

## 2016-06-13 NOTE — Telephone Encounter (Signed)
Pt has an appt on 9/20

## 2016-06-26 DIAGNOSIS — H43813 Vitreous degeneration, bilateral: Secondary | ICD-10-CM | POA: Diagnosis not present

## 2016-06-26 DIAGNOSIS — H11441 Conjunctival cysts, right eye: Secondary | ICD-10-CM | POA: Diagnosis not present

## 2016-06-26 DIAGNOSIS — H524 Presbyopia: Secondary | ICD-10-CM | POA: Diagnosis not present

## 2016-06-26 DIAGNOSIS — H25813 Combined forms of age-related cataract, bilateral: Secondary | ICD-10-CM | POA: Diagnosis not present

## 2016-07-04 DIAGNOSIS — R7301 Impaired fasting glucose: Secondary | ICD-10-CM | POA: Insufficient documentation

## 2016-07-04 NOTE — Progress Notes (Signed)
Chief Complaint  Patient presents with  . Medicare Wellness    fasting AWV/med check plus with pelvic. Having serious sleeping issues since she moved-(moved about a year ago and started this year)-cannot get to sleep.     Lindsey Frank is a 70 y.o. female who presents for annual physical, Medicare wellness visit and follow-up on chronic medical conditions.   Impaired fasting glucose: Fasting sugar was elevated at 103 on her wellness last year.  It remained elevated at her 6 month f/u (12/2015), at 109, with a HgA1c of 6.1. She reports cutting back on her portions of chocolate (just 4 kisses/day, or a piece of a Hershey's bar). Avoids other sweets. Sometimes has a blueberry muffin for breakfast, doesn't each much rice, pasta. Some sandwiches, on whole wheat bread.  She had been walking a mile a day, 5 days/week, including a hill.  Mixed hyperlipidemia:  TG was elevated on the last two checks (after never starting the recommended fish oil after the first check); she still has not ever started taking fish oil. She reports being compliant with her Crestor.  Patient is reportedly following a low-fat, low cholesterol diet. Compliant with medications and denies medication side effects.  Tremor--Tolerating the propranolol without any side effects, along with the clonazepam (previously took nadolol, changed due to cost). She does still have some persistent tremor "that I hate"--she finds it very embarrassing. She spent a considerable amount of time discussing how others comment to her about seeing her head moving, grandkids mention, is very embarrassed. She also previously tried mysoline, but didn't tolerate it ("sick all day after one pill").  GERD--She takes Prilosec OTC daily. She once ran out and was off about a week, but had recurrent heartburn. She is now taking it daily without any heartburn. Hasn't missed any doses recently. Denies dysphagia.  Depression--she denies any recurrent symptoms  since stopping sertraline  6-8 months ago. She doesn't want to take a lot of medications, mostly citing cost.   She had gone off meds in the past, and had recurrence of depression, some of which patient relates to work stress. (no longer working). There are 5 people in her family who committed suicide.    She is complaining of insomnia.  She is very frustrated by the few nights where she can't sleep at all, for the entire night.  At least once or twice a week she has had sleepness nights.  She moved rooms in her house last week (switching to the guest room that is at the front of the house) and has only occurred once since then.  Feels a little safer at the front of the house than the back.  She has been taking Advil PM every night. Prior to this had taken Tylenol PM.  It doesn't seem to help at all on those night.  She denies any pain/discomfort contributing to her poor sleep, nor any significant anxiety/mind racing.   Immunization History  Administered Date(s) Administered  . Influenza Split 08/16/2012, 07/28/2014  . Influenza, High Dose Seasonal PF 06/16/2015  . Pneumococcal Conjugate-13 05/27/2014  . Pneumococcal Polysaccharide-23 04/17/2010  . Td 10/16/2000  . Tdap 04/17/2010  . Zoster 03/03/2007   Last Pap smear: 05/2014 Last mammogram:12/2015 Last colonoscopy: 8/09  Last DEXA: 10/2004 --never got last year as was recommended (recommended the last 3 years); Last year she was agreeable to schedule when she schedules her mammogram, but never got Exercise: walks5/week (a little over a mile). No weight-bearing exercise. Goes to dentist  every 6 months  Ophtho: regularly  Other doctors caring for patient include: Dentist (Dr. Adria Devon)  Ophtho (Dr. Dagoberto Ligas)  GI (Dr. Madilyn Fireman at [place], 2009)  Neuro (Dr. Frances Furbish)  Depression screen, Fall screen and Functional Assessment screens were all unremarkable--see epic questionnaires.  End of Life Discussion:  Patient has a living will  and medical power of attorney. She was asked to give [place] a copy for our records.  Past Medical History:  Diagnosis Date  . Depression   . Elevated C-reactive protein (CRP) 01/2005  . Elevated cholesterol 01/2005  . Elevated triglycerides with high cholesterol 01/2005  . GERD (gastroesophageal reflux disease)   . History of endometriosis   . Migraine   . Tremor, essential     Past Surgical History:  Procedure Laterality Date  . ABDOMINAL ADHESION SURGERY  1981  . APPENDECTOMY  1977  . COLONOSCOPY  05/26/2008  . LAPAROSCOPY  1981   endometriosis  . plastic surgery-neck  2008  . r breast lump  1992   benign  . TONSILECTOMY, ADENOIDECTOMY, BILATERAL MYRINGOTOMY AND TUBES  1953   age 41  . UTERINE FIBROID SURGERY  1977    Social History   Social History  . Marital status: Divorced    Spouse name: N/A  . Number of children: 1  . Years of education: college   Occupational History  . 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 Smoking status: Former Smoker    Packs/day: 0.25    Years: 30.00    Types: Cigarettes  . Smokeless tobacco: Never Used     Comment: quit 12/2014, relapsed with stress, quit again end of July 2016  . Alcohol use 0.0 oz/week     Comment: Tia Maria once or twice a week  . Drug use: No  . Sexual activity: Not Currently   Other Topics Concern  . Not on file   Social History Narrative   Lives alone.  Son lives in [place], 4 grandchildren   College Education   Right handed   Caffeine one cup daily (tea with milk).    Family History  Problem Relation Age of Onset  . Diabetes Mother   . Cancer Father     throat  . Heart disease Father 51  . Stroke Father 60  . Depression Brother   . Cancer Maternal Aunt     mult aunts with CA--uterine, stomach  . Alzheimer's disease Maternal Uncle   . Depression Maternal Uncle     suicide    Outpatient Encounter Prescriptions as of 07/05/2016  Medication Sig  . aspirin 81 MG  tablet Take 81 mg by mouth daily.  . Calcium Carbonate-Vitamin D (CALCIUM + D) 600-200 MG-UNIT TABS Take 1,200 mg by mouth daily.    . clonazePAM (KLONOPIN) 1 MG tablet Take 1 tablet (1 mg total) by mouth 2 (two) times daily.  . Ibuprofen-Diphenhydramine Cit (ADVIL PM PO) Take 2 tablets by mouth at bedtime.  Marland Kitchen omeprazole (PRILOSEC OTC) 20 MG tablet Take 20 mg by mouth daily.  . propranolol ER (INDERAL LA) 80 MG 24 hr capsule GIVE "Lindsey Frank" 1 CAPSULE(80 MG) BY MOUTH DAILY  . rosuvastatin (CRESTOR) 40 MG tablet Take 1 tablet (40 mg total) by mouth daily.  . [DISCONTINUED] clonazePAM (KLONOPIN) 1 MG tablet TAKE 1 TABLET BY MOUTH TWICE DAILY  . traZODone (DESYREL) 50 MG tablet Take 0.5-2 tablets (25-100 mg total) by mouth at bedtime as needed for sleep.  . [  DISCONTINUED] Diphenhydramine-APAP, sleep, (TYLENOL PM EXTRA STRENGTH PO) Take 200 mg by mouth at bedtime.    No facility-administered encounter medications on file as of 07/05/2016.   (trazadone rx'd today, not prior to visit)  No Known Allergies   ROS: The patient denies anorexia, fever, headaches, vision changes, decreased hearing, ear pain, sore throat, breast concerns, chest pain, palpitations, dizziness, syncope, dyspnea on exertion, cough, swelling, nausea, vomiting, diarrhea, constipation, abdominal pain, melena, hematochezia, indigestion/heartburn, hematuria, incontinence, dysuria, vaginal bleeding, discharge, odor or itch, genital lesions, joint pains, numbness, tingling, weakness, depression, anxiety, abnormal bleeding/bruising, or enlarged lymph nodes. Some postnasal drainage, chronic (since childhood--doesn't desire treatment). More gas than she used to. (loves beans). 5-6# weight loss since her last visit. Insomnia as per HPI. Denies depression.  PHYSICAL EXAM:  BP 120/82 (BP Location: Left Arm, Patient Position: Sitting, Cuff Size: Normal)   Pulse 64   Ht 5' 3.5" (1.613 m)   Wt 164 lb (74.4 kg)   BMI 28.60 kg/m    General  Appearance:  Alert, cooperative, no distress, appears stated age.   Head:  Normocephalic, without obvious abnormality, atraumatic   Eyes:  PERRL, conjunctiva/corneas clear, EOM's intact, fundi  benign   Ears:  Normal TM's and external ear canals   Nose:  Nares normal, mucosa normal, no drainage or sinus tenderness   Throat:  Lips, mucosa, and tongue normal; teeth and gums normal.   Neck:  Supple, no lymphadenopathy; thyroid: no enlargement/tenderness/nodules; no carotid  bruit or JVD   Back:  Spine nontender, + mild scoliosis; no CVA tenderness   Lungs:  Clear to auscultation bilaterally without wheezes, rales or ronchi; respirations unlabored   Chest Wall:  No tenderness or deformity   Heart:  Regular rate and rhythm, S1 and S2 normal, no murmur, rub or gallop   Breast Exam:  No tenderness, masses, or nipple discharge or inversion. No axillary lymphadenopathy   Abdomen:  Soft, non-tender, nondistended, normoactive bowel sounds, no masses, no hepatosplenomegaly   Genitalia:  Normal external genitalia without lesions. BUS and vagina normal, mild atrophic changes; no cervical motion tenderness. No abnormal vaginal discharge. Uterus and adnexa not enlarged, nontender, no masses. Pap not performed   Rectal:  Normal tone, no masses or tenderness; guaiac negative stool   Extremities:  No clubbing, cyanosis or edema   Pulses:  2+ and symmetric all extremities   Skin:  Skin color, texture, turgor normal, no rashes or lesions.  Lymph nodes:  Cervical, supraclavicular, and axillary nodes normal   Neurologic:  CNII-XII intact, normal strength, sensation and gait; reflexes 2-3+ and symmetric throughout. only minimal tremor of head noted during today's visit   Psych:  Normal mood, affect, hygiene and grooming; initially had some decreased eye contact, when discussing her tremor, but improved during visit, and had full range of affect, smiling.   Lab  Results  Component Value Date   HGBA1C 5.9 07/05/2016     ASSESSMENT/PLAN:  Annual physical exam  Benign essential tremor - well controlled on current regimen, not interfering with activities, just embarrassing. Encouraged to work on self-esteem, not be bothered what others say - Plan: clonazePAM (KLONOPIN) 1 MG tablet  Mixed hyperlipidemia - lowfat, low cholesterol diet reviewed.  If TG remains elevated, fish oil will again be recommended, vs increasing Crestor dose - Plan: Comprehensive metabolic panel, Lipid panel  Gastroesophageal reflux disease, esophagitis presence not specified - well controlled; consider trial cutting back since doing so well without symptoms  Depression, major, in remission (HCC) -  off medications. Unsure if some of her embarrassment re: tremor could be mild depression recurring. counseled extensively  Impaired fasting glucose - Plan: HgB A1c  Medicare annual wellness visit, subsequent  Medication monitoring encounter - Plan: Comprehensive metabolic panel  Need for prophylactic vaccination and inoculation against influenza - Plan: Flu vaccine HIGH DOSE PF (Fluzone High dose)  Encounter for routine gynecological examination  Immunization due - Plan: Pneumococcal polysaccharide vaccine 23-valent greater than or equal to 2yo subcutaneous/IM  Insomnia - sleep hygiene reviewed in detail, as well as risks of OTC meds, and risks/SE of trazadone - Plan: traZODone (DESYREL) 50 MG tablet    Discussed restarting zoloft vs using trazadone prn--prefers trial of trazadone.  Discussed monthly self breast exams and yearly mammograms; at least 30 minutes of aerobic activity at least 5 days/week, weight bearing exercise 2x/week; proper sunscreen use reviewed; healthy diet, including goals of calcium and vitamin D intake and alcohol recommendations (less than or equal to 1 drink/day) reviewed; regular seatbelt use; changing batteries in smoke detectors. Immunization  recommendations discussed--high dose flu shot given. She had pneumovax prior to age of 57 due to her being a smoker--pneumovax given today Colonoscopy recommendations reviewed--UTD. Pap smear next year Patient promises to schedule DEXA when she has her next mammogram (due in March).  MOST form reviewed and updated today. Full Code, Full Care.  Patient asked to bring copies of Living Will/healthcare POA.  She reports she just got Crestor filled (along with the propranolol). Refill clonazepam today   Start taking 1/2 tablet of the trazadone at bedtime as needed for sleep.  If 1/2 tablet isn't effective, try the full tablet.  You may use up to 2 tablets at a time (100mg ) for insomnia. Stop using Advil PM.  If this isn't effective by itself and you want to see how it might work together, I would prefer regular use of tylenol PM rather than Advil PM (safer on the kidneys and stomach for more chronic use).   Medicare Attestation I have personally reviewed: The patient's medical and social history Their use of alcohol, tobacco or illicit drugs Their current medications and supplements The patient's functional ability including ADLs,fall risks, home safety risks, cognitive, and hearing and visual impairment Diet and physical activities Evidence for depression or mood disorders  The patient's weight, height, BMI, and visual acuity have been recorded in the chart.  I have made referrals, counseling, and provided education to the patient based on review of the above and I have provided the patient with a written personalized care plan for preventive services.     Vertie Dibbern A, MD   07/04/2016

## 2016-07-05 ENCOUNTER — Encounter: Payer: Self-pay | Admitting: Family Medicine

## 2016-07-05 ENCOUNTER — Ambulatory Visit (INDEPENDENT_AMBULATORY_CARE_PROVIDER_SITE_OTHER): Payer: Medicare HMO | Admitting: Family Medicine

## 2016-07-05 VITALS — BP 120/82 | HR 64 | Ht 63.5 in | Wt 164.0 lb

## 2016-07-05 DIAGNOSIS — Z01419 Encounter for gynecological examination (general) (routine) without abnormal findings: Secondary | ICD-10-CM

## 2016-07-05 DIAGNOSIS — E782 Mixed hyperlipidemia: Secondary | ICD-10-CM

## 2016-07-05 DIAGNOSIS — R7301 Impaired fasting glucose: Secondary | ICD-10-CM | POA: Diagnosis not present

## 2016-07-05 DIAGNOSIS — Z5181 Encounter for therapeutic drug level monitoring: Secondary | ICD-10-CM

## 2016-07-05 DIAGNOSIS — F325 Major depressive disorder, single episode, in full remission: Secondary | ICD-10-CM

## 2016-07-05 DIAGNOSIS — K219 Gastro-esophageal reflux disease without esophagitis: Secondary | ICD-10-CM

## 2016-07-05 DIAGNOSIS — Z23 Encounter for immunization: Secondary | ICD-10-CM | POA: Diagnosis not present

## 2016-07-05 DIAGNOSIS — Z0000 Encounter for general adult medical examination without abnormal findings: Secondary | ICD-10-CM | POA: Diagnosis not present

## 2016-07-05 DIAGNOSIS — G4700 Insomnia, unspecified: Secondary | ICD-10-CM | POA: Diagnosis not present

## 2016-07-05 DIAGNOSIS — G250 Essential tremor: Secondary | ICD-10-CM | POA: Diagnosis not present

## 2016-07-05 DIAGNOSIS — R69 Illness, unspecified: Secondary | ICD-10-CM | POA: Diagnosis not present

## 2016-07-05 LAB — LIPID PANEL
CHOL/HDL RATIO: 2.7 ratio (ref <=5.0)
Cholesterol: 151 mg/dL (ref 125–200)
HDL: 55 mg/dL (ref >=46)
LDL CALC: 66 mg/dL (ref <130)
TRIGLYCERIDES: 148 mg/dL (ref <150)
VLDL: 30 mg/dL (ref <30)

## 2016-07-05 LAB — COMPREHENSIVE METABOLIC PANEL
ALBUMIN: 4.7 g/dL (ref 3.6–5.1)
ALT: 15 U/L (ref 6–29)
AST: 23 U/L (ref 10–35)
Alkaline Phosphatase: 56 U/L (ref 33–130)
BUN: 20 mg/dL (ref 7–25)
CALCIUM: 10.1 mg/dL (ref 8.6–10.4)
CHLORIDE: 103 mmol/L (ref 98–110)
CO2: 27 mmol/L (ref 20–31)
Creat: 0.85 mg/dL (ref 0.50–0.99)
GLUCOSE: 96 mg/dL (ref 65–99)
POTASSIUM: 4.2 mmol/L (ref 3.5–5.3)
Sodium: 139 mmol/L (ref 135–146)
Total Bilirubin: 0.6 mg/dL (ref 0.2–1.2)
Total Protein: 7.1 g/dL (ref 6.1–8.1)

## 2016-07-05 LAB — POCT GLYCOSYLATED HEMOGLOBIN (HGB A1C): Hemoglobin A1C: 5.9

## 2016-07-05 MED ORDER — CLONAZEPAM 1 MG PO TABS
1.0000 mg | ORAL_TABLET | Freq: Two times a day (BID) | ORAL | 1 refills | Status: DC
Start: 2016-07-05 — End: 2017-01-18

## 2016-07-05 MED ORDER — TRAZODONE HCL 50 MG PO TABS: 25.0000 mg | ORAL_TABLET | Freq: Every evening | ORAL | 5 refills | Status: DC | PRN

## 2016-07-05 NOTE — Patient Instructions (Addendum)
HEALTH MAINTENANCE RECOMMENDATIONS:  It is recommended that you get at least 30 minutes of aerobic exercise at least 5 days/week (for weight loss, you may need as much as 60-90 minutes). This can be any activity that gets your heart rate up. This can be divided in 10-15 minute intervals if needed, but try and build up your endurance at least once a week.  Weight bearing exercise is also recommended twice weekly.  Eat a healthy diet with lots of vegetables, fruits and fiber.  "Colorful" foods have a lot of vitamins (ie green vegetables, tomatoes, red peppers, etc).  Limit sweet tea, regular sodas and alcoholic beverages, all of which has a lot of calories and sugar.  Up to 1 alcoholic drink daily may be beneficial for women (unless trying to lose weight, watch sugars).  Drink a lot of water.  Calcium recommendations are 1200-1500 mg daily (1500 mg for postmenopausal women or women without ovaries), and vitamin D 1000 IU daily.  This should be obtained from diet and/or supplements (vitamins), and calcium should not be taken all at once, but in divided doses.  Monthly self breast exams and yearly mammograms for women over the age of 59 is recommended.  Sunscreen of at least SPF 30 should be used on all sun-exposed parts of the skin when outside between the hours of 10 am and 4 pm (not just when at beach or pool, but even with exercise, golf, tennis, and yard work!)  Use a sunscreen that says "broad spectrum" so it covers both UVA and UVB rays, and make sure to reapply every 1-2 hours.  Remember to change the batteries in your smoke detectors when changing your clock times in the spring and fall.  Use your seat belt every time you are in a car, and please drive safely and not be distracted with cell phones and texting while driving.   Lindsey Frank , Thank you for taking time to come for your Medicare Wellness Visit. I appreciate your ongoing commitment to your health goals. Please review the  following plan we discussed and let me know if I can assist you in the future.   These are the goals we discussed: Goals    None      This is a list of the screening recommended for you and due dates:  Health Maintenance  Topic Date Due  . Pneumonia vaccines (2 of 2 - PPSV23) 05/28/2015  . Flu Shot  05/16/2016  . Mammogram  12/26/2017  . Colon Cancer Screening  05/26/2018  . Tetanus Vaccine  04/17/2020  . DEXA scan (bone density measurement)  Completed  . Shingles Vaccine  Completed  .  Hepatitis C: One time screening is recommended by Center for Disease Control  (CDC) for  adults born from [birth year] through 1964.   Completed   Pneumonia and flu shots were given today. Your next mammogram is due 12/2016 (not 2019 as stated above)--Please schedule your bone density for the same day.  They likely will need to contact us for a signature on the order.    Start taking 1/2 tablet of the trazadone at bedtime as needed for sleep.  If 1/2 tablet isn't effective, try the full tablet.  You may use up to 2 tablets at a time (100mg ) for insomnia. Stop using Advil PM.  If this isn't effective by itself and you want to see how it might work together, I would prefer regular use of tylenol PM rather than Advil PM (safer on  the kidneys and stomach for more chronic use).  Please contact us immediately if you develop any recurrent symptoms of depression, to get restarted on the sertraline.  Please get us a copy of your Living Will and HealthCare power of attorney at your convenience.  Please start taking omega-3 fish oil regularly (I recommend a dose of 3000mg  daily).

## 2016-07-10 ENCOUNTER — Telehealth: Payer: Self-pay | Admitting: *Deleted

## 2016-07-10 ENCOUNTER — Other Ambulatory Visit: Payer: Self-pay | Admitting: *Deleted

## 2016-07-10 MED ORDER — SERTRALINE HCL 50 MG PO TABS: 50.0000 mg | ORAL_TABLET | Freq: Every day | ORAL | 1 refills | Status: DC

## 2016-07-10 NOTE — Telephone Encounter (Signed)
Patient weaned herself off of her sertraline and would like to be put back on, asking to have called into [facility].

## 2016-07-10 NOTE — Telephone Encounter (Signed)
Spoke with patient and went over restart instructions with her for the sertraline. She wanted me to let you know tha the trazodone made her "sick on her stomach," and she will not be taking it again. She does not want anything else at the moment. She starting thinking and she thinks that her sleep issue began when she weaned herself off of the sertraline. She is hopeful that getting back on this will help. She said she will let you know.

## 2016-07-10 NOTE — Addendum Note (Signed)
Addended byJoselyn Arrow: Adetokunbo Mccadden on: 07/10/2016 12:57 PM   Modules accepted: Orders

## 2016-07-10 NOTE — Telephone Encounter (Signed)
Restart the 50mg  tablet.  Advise pt to start at 1/2 tablet for a week, then increase to the full tablet.  She did well on the 50mg  dose for a while, so I'd keep it there for at least 4-6 weeks, and not further increase back up to 100mg  unless needed at that time.  Okay for 50mg  #90 with 1 refill

## 2016-09-06 ENCOUNTER — Other Ambulatory Visit: Payer: Self-pay | Admitting: Family Medicine

## 2016-09-06 DIAGNOSIS — E782 Mixed hyperlipidemia: Secondary | ICD-10-CM

## 2016-12-05 ENCOUNTER — Telehealth: Payer: Self-pay

## 2016-12-05 DIAGNOSIS — E782 Mixed hyperlipidemia: Secondary | ICD-10-CM

## 2016-12-05 MED ORDER — ROSUVASTATIN CALCIUM 40 MG PO TABS: ORAL_TABLET | ORAL | 0 refills | Status: DC

## 2016-12-05 MED ORDER — SERTRALINE HCL 50 MG PO TABS: 50.0000 mg | ORAL_TABLET | Freq: Every day | ORAL | 0 refills | Status: DC

## 2016-12-05 MED ORDER — PROPRANOLOL HCL ER 80 MG PO CP24: ORAL_CAPSULE | ORAL | 0 refills | Status: DC

## 2016-12-05 NOTE — Telephone Encounter (Signed)
Request for med refills faxed to office for [facility] [address] Pt is switching pharmacies. Trixie Rude/RLB

## 2016-12-05 NOTE — Telephone Encounter (Signed)
rx renewed. /RLB 

## 2016-12-05 NOTE — Telephone Encounter (Signed)
Per protocol for the CMA's, okay to refill x 90d (I know sertraline is supposed to be verified by me--okay for all 3).  She had appt in September, and scheduled for f/u in April

## 2017-01-11 ENCOUNTER — Encounter: Payer: Medicare HMO | Admitting: Family Medicine

## 2017-01-16 NOTE — Progress Notes (Signed)
Chief Complaint  Patient presents with  . Hypertension    fasting med check.    Impaired fasting glucose: Fasting sugar was elevated at 103 on her wellness visit in 05/2015.  It remained elevated at her 6 month f/u (12/2015), at 109, with a HgA1c of 6.1, and last check 06/2016 was 5.9. She reports cutting back on her portions of chocolate (just 4 kisses/day, or a piece of a Hershey's bar). Avoids other sweets, except for her birthday recently.  Eats Chex cereal (honey nut) mostly for breakfast (no longer having muffins). She doesn't each much rice, pasta. Some sandwiches, on whole wheat bread.  She had been walking almost a mile a day, 5 days/week, including a hill (20 minutes). No other exercise.  Mixed hyperlipidemia:  TG has elevated multiple times in the past.  Last check was much better. She reports being compliant with her Crestor. Patient is reportedly following a low-fat, low cholesterol diet. Compliant with medications and denies medication side effects. Lab Results  Component Value Date   CHOL 151 07/05/2016   HDL 55 07/05/2016   LDLCALC 66 07/05/2016   TRIG 148 07/05/2016   CHOLHDL 2.7 07/05/2016    Tremor--Tolerating the propranolol without any side effects, along with the clonazepam (previously took nadolol, changed due to cost). She does still have some persistent tremor which she finds embarrassing. She previously mentioned how others comment to her about seeing her head moving, grandkids mention, is very embarrassed. She hasn't had anyone new say anything recently. She previously tried mysoline, but didn't tolerate it ("sick all day after one pill"). She was asking about taking clonazepam 3x/d rather than BID.  GERD--She takes Prilosec OTC daily.She had recurrent heartburn when she tried cutting back to every other day--symptoms on the day she didn't take it.  Back to taking it daily, and it controls her heartburn well.. Denies dysphagia.  Depression--she stopped Sertraline  last year, didn't have any recurrent depression after stopping but she had problems with insomnia. OTC meds didn't help.  We tried trazadone at last visit--she reports it made her nauseated, couldn't tolerate it, so went back on the sertraline. She is taking  daily.  Sleep is much better.  Denies depression.  (She had gone off meds in the past, and had recurrence of depression, some of which patient relates to work stress. (no longer working). There are 5 people in her family who committed suicide.)  She has h/o migraines.  At the end of the day, or when under a lot of stress, she gets a line of pain following the part on her left side of her head, going all the way back to her neck.  This is relieved by 2 Aleve.  Needs this about once a week.   PMH, PSH, SH reviewed  Outpatient Encounter Prescriptions as of 01/18/2017  Medication Sig  . aspirin 81 MG tablet Take 81 mg by mouth daily.  . Calcium Carbonate-Vitamin D (CALCIUM + D) 600-200 MG-UNIT TABS Take 1,200 mg by mouth daily.    . clonazePAM (KLONOPIN) 1 MG tablet Take 1 tablet (1 mg total) by mouth 2 (two) times daily.  . diphenhydramine-acetaminophen (TYLENOL PM) 25-500 MG TABS tablet Take 2 tablets by mouth at bedtime as needed.  Marland Kitchen omeprazole (PRILOSEC OTC) 20 MG tablet Take 20 mg by mouth daily.  . propranolol ER (INDERAL LA) 80 MG 24 hr capsule GIVE "Trude" 1 CAPSULE(80 MG) BY MOUTH DAILY  . rosuvastatin (CRESTOR) 40 MG tablet TAKE 1 TABLET(40 MG)  BY MOUTH DAILY  . sertraline (ZOLOFT) 50 MG tablet Take 1 tablet (50 mg total) by mouth daily.  . [DISCONTINUED] Ibuprofen-Diphenhydramine Cit (ADVIL PM PO) Take 2 tablets by mouth at bedtime.   No facility-administered encounter medications on file as of 01/18/2017.    Allergies  Allergen Reactions  . Trazodone And Nefazodone Nausea Only   ROS:  No fever, chills, URI symptoms.  Headache as per HPI. No dizziness, chest pain, palpitations, shortness of breath, numbness, tingling. Tremor  per HPI.  Denies depression; insomnia improved since restarting sertraline. +intentional weight loss, since cutting back on sweets, eating more vegetables. No change in hair/skin/bowels/moods. No bleeding, bruising, rash or other concerns   PHYSICAL EXAM:  BP 120/80 (BP Location: Left Arm, Patient Position: Sitting, Cuff Size: Normal)   Pulse (!) 56   Ht 5' 3.5" (1.613 m)   Wt 156 lb (70.8 kg)   BMI 27.20 kg/m   Wt Readings from Last 3 Encounters:  01/18/17 156 lb (70.8 kg)  07/05/16 164 lb (74.4 kg)  12/30/15 169 lb 12.8 oz (77 kg)   Well developed, pleasant female in good spirits.   HEENT: PERRL, EOMI, conjunctiva clear, OP clear Neck: no lymphadenopathy, thyromegaly or bruit Heart: regular rate and rhythm, no murmur Lungs: clear bilaterally Abdomen: soft, nontender, no mass Extremities: no edema, 2+ pulse Neuro: alert and oriented. Cranial nerves intact. Normal strength, gait, sensation.  No significant tremor noted in hands, and head tremor at rest is minimal--seems much better than at prior visits. Psych: normal mood, affect, hygiene and grooming Skin: normal turgor, no visible rashes/lesions  Lab Results  Component Value Date   HGBA1C 6.1 01/18/2017   ASSESSMENT/PLAN:  IFG (impaired fasting glucose) - stable. encouraged increased exercise and further weight loss (from waist); low sugar/lowcarb diet reviewed - Plan: HgB A1c, Glucose, random, Glucose, random  Benign essential tremor - well controlled on current regimen. doing well--no indication to increase her clonazepam frequency as she asked about - Plan: clonazePAM (KLONOPIN) 1 MG tablet, propranolol ER (INDERAL LA) 80 MG 24 hr capsule  Mixed hyperlipidemia - recheck today; encouraged lowfat diet.  If TG's are elevated, to consider fish oil - Plan: rosuvastatin (CRESTOR) 40 MG tablet, Lipid panel, Hepatic function panel, Lipid panel, Hepatic function panel  Depression, major, in remission (HCC) - Plan: sertraline  (ZOLOFT) 50 MG tablet  Anxiety - Plan: sertraline (ZOLOFT) 50 MG tablet  Medication monitoring encounter - Plan: Hepatic function panel, Hepatic function panel   Glucose, lipids. LFT   Try and increase your exercise to a minimum of 150 minutes of aerobic activity each week.  Also be sure to get at least 2 days/week some weight-bearing exercise. Your hemoglobin A1c (90 day look at average sugars) remains slightly elevated, indicating pre-diabetes.  Continue to try and limit your sweets, carbs (breads, rice, pasta, potatoes) and getting daily exercise, as well as continued weight loss will.

## 2017-01-17 ENCOUNTER — Other Ambulatory Visit: Payer: Self-pay | Admitting: Family Medicine

## 2017-01-17 DIAGNOSIS — G250 Essential tremor: Secondary | ICD-10-CM

## 2017-01-18 ENCOUNTER — Ambulatory Visit (INDEPENDENT_AMBULATORY_CARE_PROVIDER_SITE_OTHER): Payer: Medicare HMO | Admitting: Family Medicine

## 2017-01-18 ENCOUNTER — Encounter: Payer: Self-pay | Admitting: Family Medicine

## 2017-01-18 VITALS — BP 120/80 | HR 56 | Ht 63.5 in | Wt 156.0 lb

## 2017-01-18 DIAGNOSIS — G250 Essential tremor: Secondary | ICD-10-CM | POA: Diagnosis not present

## 2017-01-18 DIAGNOSIS — R7301 Impaired fasting glucose: Secondary | ICD-10-CM | POA: Diagnosis not present

## 2017-01-18 DIAGNOSIS — R69 Illness, unspecified: Secondary | ICD-10-CM | POA: Diagnosis not present

## 2017-01-18 DIAGNOSIS — F325 Major depressive disorder, single episode, in full remission: Secondary | ICD-10-CM

## 2017-01-18 DIAGNOSIS — Z5181 Encounter for therapeutic drug level monitoring: Secondary | ICD-10-CM

## 2017-01-18 DIAGNOSIS — F419 Anxiety disorder, unspecified: Secondary | ICD-10-CM

## 2017-01-18 DIAGNOSIS — E782 Mixed hyperlipidemia: Secondary | ICD-10-CM | POA: Diagnosis not present

## 2017-01-18 LAB — LIPID PANEL
Cholesterol: 157 mg/dL (ref <200)
HDL: 62 mg/dL (ref >50)
LDL Cholesterol: 58 mg/dL (ref <100)
Total CHOL/HDL Ratio: 2.5 Ratio (ref <5.0)
Triglycerides: 187 mg/dL — ABNORMAL HIGH (ref <150)
VLDL: 37 mg/dL — ABNORMAL HIGH (ref <30)

## 2017-01-18 LAB — HEPATIC FUNCTION PANEL
ALK PHOS: 61 U/L (ref 33–130)
ALT: 13 U/L (ref 6–29)
AST: 20 U/L (ref 10–35)
Albumin: 4.6 g/dL (ref 3.6–5.1)
BILIRUBIN DIRECT: 0.1 mg/dL (ref <=0.2)
BILIRUBIN INDIRECT: 0.4 mg/dL (ref 0.2–1.2)
BILIRUBIN TOTAL: 0.5 mg/dL (ref 0.2–1.2)
Total Protein: 6.8 g/dL (ref 6.1–8.1)

## 2017-01-18 LAB — POCT GLYCOSYLATED HEMOGLOBIN (HGB A1C): HEMOGLOBIN A1C: 6.1

## 2017-01-18 LAB — GLUCOSE, RANDOM: Glucose, Bld: 100 mg/dL — ABNORMAL HIGH (ref 65–99)

## 2017-01-18 MED ORDER — PROPRANOLOL HCL ER 80 MG PO CP24: ORAL_CAPSULE | ORAL | 1 refills | Status: DC

## 2017-01-18 MED ORDER — ROSUVASTATIN CALCIUM 40 MG PO TABS: ORAL_TABLET | ORAL | 1 refills | Status: DC

## 2017-01-18 MED ORDER — SERTRALINE HCL 50 MG PO TABS: 50.0000 mg | ORAL_TABLET | Freq: Every day | ORAL | 1 refills | Status: DC

## 2017-01-18 MED ORDER — CLONAZEPAM 1 MG PO TABS: 1.0000 mg | ORAL_TABLET | Freq: Two times a day (BID) | ORAL | 1 refills | Status: DC

## 2017-01-18 NOTE — Patient Instructions (Signed)
  Try and increase your exercise to a minimum of 150 minutes of aerobic activity each week.  Also be sure to get at least 2 days/week some weight-bearing exercise. Your hemoglobin A1c (90 day look at average sugars) remains slightly elevated, indicating pre-diabetes.  Continue to try and limit your sweets, carbs (breads, rice, pasta, potatoes) and getting daily exercise, as well as continued weight loss will.   I recommend getting the new shingles vaccine (Shingrix). You will need to check with your insurance to see if it is covered, and if covered by Medicare Part D, you need to get from the pharmacy rather than our office.  It is a series of 2 injections, spaced 2 months apart.

## 2017-02-15 DIAGNOSIS — R69 Illness, unspecified: Secondary | ICD-10-CM | POA: Diagnosis not present

## 2017-02-19 ENCOUNTER — Emergency Department (INDEPENDENT_AMBULATORY_CARE_PROVIDER_SITE_OTHER)
Admission: EM | Admit: 2017-02-19 | Discharge: 2017-02-19 | Disposition: A | Discharge status: Home / Self Care | Payer: Medicare HMO | Source: Home / Self Care | Attending: Family Medicine | Admitting: Family Medicine

## 2017-02-19 ENCOUNTER — Encounter: Payer: Self-pay | Admitting: Emergency Medicine

## 2017-02-19 DIAGNOSIS — S61411A Laceration without foreign body of right hand, initial encounter: Secondary | ICD-10-CM

## 2017-02-19 NOTE — ED Provider Notes (Signed)
[facility]    CSN: [id number] Arrival date & time: 02/19/17  1930     History   Chief Complaint Chief Complaint  Patient presents with  . Laceration    HPI Lindsey Frank is a 71 y.o. female.   While outside working in her yard today, patient slipped on a rock and fell, lacerating her right hand on gravel.  She also lightly bumped her head on the ground but denies loss of consciousness or headache.  No scalp laceration.   The history is provided by the patient and a friend.  Laceration  Location:  Hand Hand laceration location:  R palm Length:  1.5cm Depth:  Through dermis Quality: straight   Bleeding: controlled   Time since incident:  2 hours Injury mechanism: gravel. Pain details:    Quality:  Aching   Severity:  Mild   Timing:  Constant   Progression:  Unchanged Foreign body present:  No foreign bodies Relieved by:  None tried Worsened by:  Movement Ineffective treatments:  None tried Tetanus status:  Up to date Associated symptoms: no numbness and no swelling     Past Medical History:  Diagnosis Date  . Depression   . Elevated C-reactive protein (CRP) 01/2005  . Elevated cholesterol 01/2005  . Elevated triglycerides with high cholesterol 01/2005  . GERD (gastroesophageal reflux disease)   . History of endometriosis   . Migraine   . Tremor, essential     Patient Active Problem List   Diagnosis Date Noted  . Impaired fasting glucose 07/04/2016  . Depression, major, in remission (HCC) 11/17/2013  . GERD (gastroesophageal reflux disease) 05/14/2013  . Tobacco use disorder 04/24/2012  . Mixed hyperlipidemia 10/26/2011  . 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 Depressive disorder, not elsewhere classified 03/30/2011  . Benign essential tremor 02/27/2011  . Pure hypercholesterolemia 02/27/2011  . Migraine     Past Surgical History:  Procedure Laterality Date  . ABDOMINAL ADHESION SURGERY  1981  . APPENDECTOMY  1977  . COLONOSCOPY   05/26/2008  . LAPAROSCOPY  1981   endometriosis  . plastic surgery-neck  2008  . r breast lump  1992   benign  . TONSILECTOMY, ADENOIDECTOMY, BILATERAL MYRINGOTOMY AND TUBES  1953   age 22  . UTERINE FIBROID SURGERY  1977    OB History    Gravida Para Term Preterm AB Living   2 1     1 1    SAB TAB Ectopic Multiple Live Births   1               Home Medications    Prior to Admission medications   Medication Sig Start Date End Date Taking? Authorizing Provider  aspirin 81 MG tablet Take 81 mg by mouth daily.    [provider]  Calcium Carbonate-Vitamin D (CALCIUM + D) 600-200 MG-UNIT TABS Take 1,200 mg by mouth daily.      [provider]  clonazePAM (KLONOPIN) 1 MG tablet Take 1 tablet (1 mg total) by mouth 2 (two) times daily. 01/18/17   [name], MD  diphenhydramine-acetaminophen (TYLENOL PM) 25-500 MG TABS tablet Take 2 tablets by mouth at bedtime as needed.    [provider]  omeprazole (PRILOSEC OTC) 20 MG tablet Take 20 mg by mouth daily.    [provider]  propranolol ER (INDERAL LA) 80 MG 24 hr capsule Take 1 CAPSULE(80 MG) BY MOUTH DAILY 01/18/17   [name], MD  rosuvastatin (CRESTOR) 40 MG tablet TAKE  1 TABLET(40 MG) BY MOUTH DAILY 01/18/17   Joselyn ArrowKnapp, Eve, MD  sertraline (ZOLOFT) 50 MG tablet Take 1 tablet (50 mg total) by mouth daily. 01/18/17   Joselyn ArrowKnapp, Eve, MD    Family History Family History  Problem Relation Age of Onset  . Diabetes Mother   . Cancer Father     throat  . Heart disease Father [phone]  . Stroke Father [id number]  . Depression Brother   . Cancer Maternal Aunt     mult aunts with CA--uterine, stomach  . Alzheimer's disease Maternal Uncle   . Depression Maternal Uncle     suicide    Social History Social History  Substance Use Topics  . Smoking status: Former Smoker    Packs/day: 0.25    Years: 30.00    Types: Cigarettes  . Smokeless tobacco: Never Used     Comment: quit 12/2014, relapsed with stress, quit again end of  July 2016  . Alcohol use 0.0 oz/week     Comment: Tia Maria once or twice a week     Allergies   Trazodone and nefazodone   Review of Systems Review of Systems  Constitutional: Negative.   HENT: Negative.   Eyes: Negative.   Neurological: Negative for syncope, weakness, light-headedness and headaches.  All other systems reviewed and are negative.    Physical Exam Triage Vital Signs ED Triage Vitals [02/19/17 1950]  Enc Vitals Group     BP 126/82     Pulse      Resp      Temp 98 F (36.7 C)     Temp Source Oral     SpO2 98 %     Weight      Height      Head Circumference      Peak Flow      Pain Score 0     Pain Loc      Pain Edu?      Excl. in GC?    No data found.   Updated Vital Signs BP 126/82 (BP Location: Right Arm)   Temp 98 F (36.7 C) (Oral)   SpO2 98%   Visual Acuity Right Eye Distance:   Left Eye Distance:   Bilateral Distance:    Right Eye Near:   Left Eye Near:    Bilateral Near:     Physical Exam  Constitutional: She is oriented to person, place, and time. She appears well-developed and well-nourished. No distress.  HENT:  Head: Normocephalic.  Right Ear: External ear normal.  Left Ear: External ear normal.  Nose: Nose normal.  Mouth/Throat: Oropharynx is clear and moist.  Right temporal/parietal area reveals no tenderness, abrasion, laceration, or hematoma.  Eyes: Conjunctivae and EOM are normal. Pupils are equal, round, and reactive to light.  Neck: Normal range of motion.  Cardiovascular: Normal rate.   Pulmonary/Chest: Effort normal.  Musculoskeletal:       Right hand: She exhibits tenderness and laceration. She exhibits normal range of motion, no bony tenderness, normal two-point discrimination, normal capillary refill and no swelling.       Hands: Palmar surface of right hand has a superficial 1.5cm laceration at base of fifth finger as noted on diagram.  Fifth finger has full range of motion, and distal neurovascular  function is intact.       Neurological: She is alert and oriented to person, place, and time.  Skin: Skin is warm and dry.  Nursing note and vitals reviewed.    UC  Treatments / Results  Labs (all labs ordered are listed, but only abnormal results are displayed) Labs Reviewed - No data to display  EKG  EKG Interpretation None       Radiology No results found.  Procedures Procedures Laceration Repair Discussed benefits and risks of procedure and verbal consent obtained. Using sterile technique and local anesthesia with 1% lidocaine without epinephrine, cleansed wound with Betadine followed by copious lavage with normal saline.  Wound carefully inspected for debris and foreign bodies; none found.  Wound closed with #5, 4-0 interrupted Prolene sutures.  Bacitracin and non-stick sterile dressing applied.  Wound precautions explained to patient.  Return for suture removal in 10 days.   Medications Ordered in UC Medications - No data to display   Initial Impression / Assessment and Plan / UC Course  I have reviewed the triage vital signs and the nursing notes.  Pertinent labs & imaging results that were available during my care of the patient were reviewed by me and considered in my medical decision making (see chart for details).    Change dressing daily and apply Bacitracin ointment to wound.  Keep wound clean and dry.  Return for any signs of infection (or follow-up with family doctor):  Increasing redness, swelling, pain, heat, drainage, etc. Return in 10 days for suture removal.      Final Clinical Impressions(s) / UC Diagnoses   Final diagnoses:  Laceration of right hand without foreign body, initial encounter    New Prescriptions New Prescriptions   No medications on file     Lattie Haw, MD 02/20/17 [phone]

## 2017-02-19 NOTE — Discharge Instructions (Signed)
Change dressing daily and apply Bacitracin ointment to wound.  Keep wound clean and dry.  Return for any signs of infection (or follow-up with family doctor):  Increasing redness, swelling, pain, heat, drainage, etc. °Return in 10 days for suture removal.   °

## 2017-02-19 NOTE — ED Triage Notes (Addendum)
Pt states she cut her right hand outside working. She did clean it with peroxide. States she is certain she is UTD on tetanus vaccine. Chart says 2011

## 2017-02-26 DIAGNOSIS — H25813 Combined forms of age-related cataract, bilateral: Secondary | ICD-10-CM | POA: Diagnosis not present

## 2017-02-26 DIAGNOSIS — H01004 Unspecified blepharitis left upper eyelid: Secondary | ICD-10-CM | POA: Diagnosis not present

## 2017-02-26 DIAGNOSIS — H01001 Unspecified blepharitis right upper eyelid: Secondary | ICD-10-CM | POA: Diagnosis not present

## 2017-03-01 ENCOUNTER — Encounter: Payer: Self-pay | Admitting: Emergency Medicine

## 2017-03-01 ENCOUNTER — Emergency Department (INDEPENDENT_AMBULATORY_CARE_PROVIDER_SITE_OTHER)
Admission: EM | Admit: 2017-03-01 | Discharge: 2017-03-01 | Disposition: A | Discharge status: Home / Self Care | Payer: Self-pay | Source: Home / Self Care | Attending: Family Medicine | Admitting: Family Medicine

## 2017-03-01 DIAGNOSIS — Z4802 Encounter for removal of sutures: Secondary | ICD-10-CM

## 2017-03-01 NOTE — Discharge Instructions (Signed)
Follow instructions on Dermabond information sheet. °

## 2017-03-01 NOTE — ED Provider Notes (Signed)
[facility]    CSN: [id number] Arrival date & time: 03/01/17  [id number]     History   Chief Complaint Chief Complaint  Patient presents with  . Suture / Staple Removal    HPI Lindsey Frank is a 71 y.o. female.   Returns for suture removal right hand.  No complaints.   The history is provided by the patient.    Past Medical History:  Diagnosis Date  . Depression   . Elevated C-reactive protein (CRP) 01/2005  . Elevated cholesterol 01/2005  . Elevated triglycerides with high cholesterol 01/2005  . GERD (gastroesophageal reflux disease)   . History of endometriosis   . Migraine   . Tremor, essential     Patient Active Problem List   Diagnosis Date Noted  . Impaired fasting glucose 07/04/2016  . Depression, major, in remission (HCC) 11/17/2013  . GERD (gastroesophageal reflux disease) 05/14/2013  . Tobacco use disorder 04/24/2012  . Mixed hyperlipidemia 10/26/2011  . 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 Depressive disorder, not elsewhere classified 03/30/2011  . Benign essential tremor 02/27/2011  . Pure hypercholesterolemia 02/27/2011  . Migraine     Past Surgical History:  Procedure Laterality Date  . ABDOMINAL ADHESION SURGERY  1981  . APPENDECTOMY  1977  . COLONOSCOPY  05/26/2008  . LAPAROSCOPY  1981   endometriosis  . plastic surgery-neck  2008  . r breast lump  1992   benign  . TONSILECTOMY, ADENOIDECTOMY, BILATERAL MYRINGOTOMY AND TUBES  1953   age 89  . UTERINE FIBROID SURGERY  1977    OB History    Gravida Para Term Preterm AB Living   2 1     1 1    SAB TAB Ectopic Multiple Live Births   1               Home Medications    Prior to Admission medications   Medication Sig Start Date End Date Taking? Authorizing Provider  aspirin 81 MG tablet Take 81 mg by mouth daily.    [provider]  Calcium Carbonate-Vitamin D (CALCIUM + D) 600-200 MG-UNIT TABS Take 1,200 mg by mouth daily.      [provider]    clonazePAM (KLONOPIN) 1 MG tablet Take 1 tablet (1 mg total) by mouth 2 (two) times daily. 01/18/17   Joselyn ArrowKnapp, Eve, MD  diphenhydramine-acetaminophen (TYLENOL PM) 25-500 MG TABS tablet Take 2 tablets by mouth at bedtime as needed.    [provider]  omeprazole (PRILOSEC OTC) 20 MG tablet Take 20 mg by mouth daily.    [provider]  propranolol ER (INDERAL LA) 80 MG 24 hr capsule Take 1 CAPSULE(80 MG) BY MOUTH DAILY 01/18/17   Joselyn ArrowKnapp, Eve, MD  rosuvastatin (CRESTOR) 40 MG tablet TAKE 1 TABLET(40 MG) BY MOUTH DAILY 01/18/17   Joselyn ArrowKnapp, Eve, MD  sertraline (ZOLOFT) 50 MG tablet Take 1 tablet (50 mg total) by mouth daily. 01/18/17   Joselyn ArrowKnapp, Eve, MD    Family History Family History  Problem Relation Age of Onset  . Diabetes Mother   . Cancer Father        throat  . Heart disease Father [id number]  . Stroke Father 3455  . Depression Brother   . Cancer Maternal Aunt        mult aunts with CA--uterine, stomach  . Alzheimer's disease Maternal Uncle   . Depression Maternal Uncle        suicide    Social History Social History  Substance  Use Topics  . Smoking status: Former Smoker    Packs/day: 0.25    Years: 30.00    Types: Cigarettes  . Smokeless tobacco: Never Used     Comment: quit 12/2014, relapsed with stress, quit again end of July 2016  . Alcohol use 0.0 oz/week     Comment: Tia Maria once or twice a week     Allergies   Trazodone and nefazodone   Review of Systems Review of Systems  All other systems reviewed and are negative.    Physical Exam Triage Vital Signs ED Triage Vitals  Enc Vitals Group     BP 03/01/17 1011 109/72     Pulse Rate 03/01/17 1011 (!) 56     Resp 03/01/17 1011 16     Temp 03/01/17 1011 97.5 F (36.4 C)     Temp Source 03/01/17 1011 Oral     SpO2 03/01/17 1011 96 %     Weight --      Height --      Head Circumference --      Peak Flow --      Pain Score 03/01/17 1012 0     Pain Loc --      Pain Edu? --      Excl. in GC? --    No data  found.   Updated Vital Signs BP 109/72 (BP Location: Left Arm)   Pulse (!) 56   Temp 97.5 F (36.4 C) (Oral)   Resp 16   SpO2 96%   Visual Acuity Right Eye Distance:   Left Eye Distance:   Bilateral Distance:    Right Eye Near:   Left Eye Near:    Bilateral Near:     Physical Exam  Constitutional: She appears well-developed and well-nourished. No distress.  Eyes: Pupils are equal, round, and reactive to light.  Cardiovascular: Normal rate.   Pulmonary/Chest: Effort normal.  Musculoskeletal:  Sutured laceration right palm shows no swelling, erythema, or drainage.  Sutures removed and healing skin edges appear friable, but no dehiscence.  Nursing note and vitals reviewed.    UC Treatments / Results  Labs (all labs ordered are listed, but only abnormal results are displayed) Labs Reviewed - No data to display  EKG  EKG Interpretation None       Radiology No results found.  Procedures Procedures Suture removal laceration right hand:  Wound cleaned with saline.  Reinforced wound with Dermabond.  Advised to leave Dermabond in place as long as possible.  Medications Ordered in UC Medications - No data to display   Initial Impression / Assessment and Plan / UC Course  I have reviewed the triage vital signs and the nursing notes.  Pertinent labs & imaging results that were available during my care of the patient were reviewed by me and considered in my medical decision making (see chart for details).    Follow instructions on Dermabond information sheet. Return as needed.    Final Clinical Impressions(s) / UC Diagnoses   Final diagnoses:  Encounter for removal of sutures    New Prescriptions New Prescriptions   No medications on file     Lattie Haw, MD 03/01/17 1034

## 2017-03-01 NOTE — ED Triage Notes (Signed)
Here for removal of sutures in right hand that were place 10 days ago.

## 2017-07-16 ENCOUNTER — Other Ambulatory Visit: Payer: Self-pay | Admitting: Family Medicine

## 2017-07-16 DIAGNOSIS — G250 Essential tremor: Secondary | ICD-10-CM

## 2017-07-16 NOTE — Telephone Encounter (Signed)
Last refilled x 6 mos 01/18/17. Scheduled for OV 11/1 Okay to refill (without add'l refill though)

## 2017-07-16 NOTE — Telephone Encounter (Signed)
Is this okay to refill? 

## 2017-08-15 NOTE — Progress Notes (Signed)
Chief Complaint  Patient presents with  . Medicare Wellness    fasting AWV/med check plus with pap. Has been having HA's-new type. Starts in the front of her heasd goes back through her scalp-starts at the end of the day.     Lindsey Frank is a 71 y.o. female who presents for annual wellness visit and follow-up on chronic medical conditions.  She has the following concerns:  She is complaining of headaches, different from her migraines (which she no longer gets).  It is the same as the way she described it at her last visit, but occurring more frequently.  At the end of the day she gets a line of pain following the part on her left side of her head, going all the way back to her neck. She notices this when she is sitting on the couch to watch TV at the end of the day. This is relieved by 2 Aleve, but doesn't always take medication for it (if she is going to bed soon).  Sometimes she will take tylenol PM and go to bed--it doesn't keep her awake, and is gone in the morning.  Impaired fasting glucose: Last A1c six months ago was 6.1, with a fasting sugar of 100.  She reports she continues to limit her portions of chocolate (just 4 kisses/day, or a piece of a Hershey's bar). Avoids other sweets.  Eats Chex cereal (honey nut) mostly for breakfast (no longer having muffins). She doesn't each much rice, pasta. Some sandwiches, on whole wheat bread. She had been walking almost a mile a day, 6 days/week, including a hill (20 minutes).   Mixed hyperlipidemia: TG hasbeen elevated multiple times in the past.   She reports being compliant with herCrestor. Patient is reportedly following a low-fat, low cholesterol diet. Compliant with medications and denies medication side effects Doesn't take fish oil, due to causing bad breath. Lab Results  Component Value Date   CHOL 157 01/18/2017   HDL 62 01/18/2017   LDLCALC 58 01/18/2017   TRIG 187 (H) 01/18/2017   CHOLHDL 2.5 01/18/2017    Tremor--Tolerating the propranolol without any side effects, along with the clonazepam (previously took nadolol, changed due to cost--current insurance books he brings in shows that the propranolol ER is the same tier (4) as nadolol, but she doesn't want to switch back). She does still have some persistent tremor -- notices it is a little harder to text on her phone, needs to use both hands with a full glass.  She never noticed her head moving/bobbing, and nobody else mentions that to her any longer (previously was grandchildren who noticed). She previously tried mysoline, but didn't tolerate it ("sick all day after one pill").  GERD--She takes Prilosec OTC daily, and denies symptoms.She had recurrent heartburn when she tried cutting back to every other day--symptoms on the day she didn't take it.  Denies dysphagia.  Depression--she stopped Sertraline last year, didn't have any recurrent depression after stopping but she had problems with insomnia. OTC meds didn't help.  We tried trazadone which made her nauseated, couldn't tolerate it, so went back on the sertraline. She is taking 66m daily.  Sleep is much better.  Denies depression.  (She had gone off meds in the past, and had recurrence of depression, some of which patient relates to work stress. (no longer working). There are 5 people in her family who committed suicide.)   Immunization History  Administered Date(s) Administered  . Influenza Split 08/16/2012, 07/28/2014  .  Influenza, High Dose Seasonal PF 06/16/2015, 07/05/2016  . Pneumococcal Conjugate-13 05/27/2014  . Pneumococcal Polysaccharide-23 04/17/2010, 07/05/2016  . Td 10/16/2000  . Tdap 04/17/2010  . Zoster 03/03/2007   Last Pap smear: 05/2014, normal but with +HR HPV  Last mammogram:12/2015 Last colonoscopy: 8/09  Last DEXA: 10/2004 --never got last year as was recommended (recommended the last 3 years); Last year she was agreeable to schedule when she schedules her  mammogram, but never got Exercise: walks 20 minutes 6 days/week. No weight-bearing exercise. Goes to dentist every 6 months  Ophtho: regularly  Other doctors caring for patient include: Dentist (Dr. Wallace Going)  Ophtho (Dr. Kathrin Penner)  GI (Dr. Amedeo Plenty at [place], 2009)  Neuro (Dr. Rexene Alberts)  Fall screen: 1 fall--tripped over rocks on the patio that landscapers had left.  Required stitches in the right palm of the hand. No other falls. Depression screen: negative Functional Status Survey:  Notable for decreased vision due to cataracts  End of Life Discussion: Patient hasa living will and medical power of attorney.   Past Medical History:  Diagnosis Date  . Depression   . Elevated C-reactive protein (CRP) 01/2005  . Elevated cholesterol 01/2005  . Elevated triglycerides with high cholesterol 01/2005  . GERD (gastroesophageal reflux disease)   . History of endometriosis   . Migraine   . Tremor, essential     Past Surgical History:  Procedure Laterality Date  . ABDOMINAL ADHESION SURGERY  1981  . APPENDECTOMY  1977  . COLONOSCOPY  05/26/2008  . LAPAROSCOPY  1981   endometriosis  . plastic surgery-neck  2008  . r breast lump  1992   benign  . TONSILECTOMY, ADENOIDECTOMY, BILATERAL MYRINGOTOMY AND TUBES  1953   age 53  . UTERINE FIBROID SURGERY  1977    Social History   Social History  . Marital status: Divorced    Spouse name: N/A  . Number of children: 1  . Years of education: college   Occupational History  . 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 Smoking status: Former Smoker    Packs/day: 0.25    Years: 30.00    Types: Cigarettes  . Smokeless tobacco: Never Used     Comment: quit 12/2014, relapsed with stress, quit again end of July 2016  . Alcohol use 0.0 oz/week     Comment: Tia Maria once or twice a week  . Drug use: No  . Sexual activity: Not Currently   Other Topics Concern  . Not on file   Social History Narrative    Lives alone.  Son lives in [place], 4 grandchildren   College Education   Right handed   Caffeine one cup daily (tea with milk).    Family History  Problem Relation Age of Onset  . Diabetes Mother   . Cancer Father        throat  . Heart disease Father 77  . Stroke Father 76  . Depression Brother   . Cancer Maternal Aunt        mult aunts with CA--uterine, stomach  . Alzheimer's disease Maternal Uncle   . Depression Maternal Uncle        suicide    Outpatient Encounter Prescriptions as of 08/16/2017  Medication Sig Note  . aspirin 81 MG tablet Take 81 mg by mouth daily.   . Calcium Carbonate-Vitamin D (CALCIUM + D) 600-200 MG-UNIT TABS Take 1,200 mg by mouth daily.     . clonazePAM (KLONOPIN)  1 MG tablet TAKE 1 TABLET BY MOUTH TWICE A DAY   . diphenhydramine-acetaminophen (TYLENOL PM) 25-500 MG TABS tablet Take 2 tablets by mouth at bedtime as needed. 08/16/2017: Uses prn, usually if has a headache when going to bed  . omeprazole (PRILOSEC OTC) 20 MG tablet Take 20 mg by mouth daily.   . propranolol ER (INDERAL LA) 80 MG 24 hr capsule Take 1 CAPSULE(80 MG) BY MOUTH DAILY   . rosuvastatin (CRESTOR) 40 MG tablet TAKE 1 TABLET(40 MG) BY MOUTH DAILY   . sertraline (ZOLOFT) 50 MG tablet Take 1 tablet (50 mg total) by mouth daily.   . [DISCONTINUED] propranolol ER (INDERAL LA) 80 MG 24 hr capsule Take 1 CAPSULE(80 MG) BY MOUTH DAILY   . [DISCONTINUED] rosuvastatin (CRESTOR) 40 MG tablet TAKE 1 TABLET(40 MG) BY MOUTH DAILY   . [DISCONTINUED] sertraline (ZOLOFT) 50 MG tablet Take 1 tablet (50 mg total) by mouth daily.    No facility-administered encounter medications on file as of 08/16/2017.     Allergies  Allergen Reactions  . Trazodone And Nefazodone Nausea Only    ROS: The patient denies anorexia, fever, vision changes, decreased hearing, ear pain, sore throat, breast concerns, chest pain, palpitations, dizziness, syncope, dyspnea on exertion, cough, swelling, nausea, vomiting,  diarrhea, constipation, abdominal pain, melena, hematochezia, indigestion/heartburn, hematuria, incontinence, dysuria, vaginal bleeding, discharge, odor or itch, genital lesions, joint pains, numbness, tingling, weakness, depression, anxiety, abnormal bleeding/bruising, or enlarged lymph nodes. Some postnasal drainage, chronic (since childhood--doesn't desire treatment). She has maintained her weight loss, continues to lose intentionally +headaches and tremor per HPI Insomnia is well controlled. Some vaginal dryness, feels like something is "blocking" vaginal opening   PHYSICAL EXAM:  BP 110/64   Pulse 60   Ht 5' 3.5" (1.613 m)   Wt 152 lb 9.6 oz (69.2 kg)   BMI 26.61 kg/m   Wt Readings from Last 3 Encounters:  08/16/17 152 lb 9.6 oz (69.2 kg)  01/18/17 156 lb (70.8 kg)  07/05/16 164 lb (74.4 kg)    General Appearance:  Alert, cooperative, no distress, appears stated age. She is sitting with very poor posture, appears somewhat kyphotic (but can straighten fairly well when asked), with neck flexed  Head:  Normocephalic, without obvious abnormality, atraumatic. Scalp is normal-no lesions or abnormality in area where she gets pain/headache.  nontender to palpation  Eyes:  PERRL, conjunctiva/corneas clear, EOM's intact, fundi benign   Ears:  Normal TM's and external ear canals   Nose:  Nares normal, mucosa normal, no drainage or sinus tenderness   Throat:  Lips, mucosa, and tongue normal; teeth and gums normal.   Neck:  Supple, no lymphadenopathy; thyroid: no enlargement/tenderness/ nodules; no carotid bruit or JVD   Back:  Spine nontender, no CVA tenderness   Lungs:  Clear to auscultation bilaterally without wheezes, rales or ronchi; respirations unlabored   Chest Wall:  No tenderness or deformity   Heart:  Regular rate and rhythm, S1 and S2 normal, no murmur, rub or gallop   Breast Exam:  No tenderness, masses, or nipple discharge or inversion. No axillary  lymphadenopathy   Abdomen:  Soft, non-tender, nondistended, normoactive bowel sounds,  no masses, no hepatosplenomegaly   Genitalia:  Normal external genitalia without lesions. BUS and vagina normal, atrophic changes noted. Cervix is without lesions, discharge; no cervical motion tenderness. No abnormal vaginal discharge. Uterus and adnexa not enlarged, nontender, no masses. Pap performed. 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 Hard stool palpable posteriorly--likely what she feels "blocking"  vaginal entry  Rectal:  Normal tone, no masses or tenderness; guaiac negative stool, large amount of firm stool in vault.  Extremities:  No clubbing, cyanosis or edema   Pulses:  2+ and symmetric all extremities   Skin:  Skin color, texture, turgor normal, no rashes or lesions.  Lymph nodes:  Cervical, supraclavicular, and axillary nodes normal   Neurologic:   CN II-XII intact, normal strength, sensation and gait; reflexes 2-3+ and symmetric throughout. Mild tremor of hands notedwith writing   Psych:  Normal mood, affect, hygiene and grooming   ASSESSMENT/PLAN:  Medicare annual wellness visit, subsequent  Impaired fasting glucose - diet/weight/exercise reviewed. - Plan: HgB A1c  Gastroesophageal reflux disease, esophagitis presence not specified - controlled with OTC PPI  Medication monitoring encounter - Plan: Comprehensive metabolic panel, Lipid panel, CBC with Differential/Platelet, TSH  Benign essential tremor - stable/tolerable. continue current regimen - Plan: propranolol ER (INDERAL LA) 80 MG 24 hr capsule, TSH  Mixed hyperlipidemia - diet reviewed; may need to retry different kind of fish oil of TG remains elevated; continue statin - Plan: rosuvastatin (CRESTOR) 40 MG tablet  Depression, major, in remission ([place]) - Plan: sertraline (ZOLOFT) 50 MG tablet  Anxiety - Plan: sertraline (ZOLOFT) 50 MG tablet  Insomnia, unspecified type - resolved after restarting  sertraline  Pap smear of cervix shows high risk HPV present - Plan: Cytology - PAP [place]  Encounter for Pap smear of cervix with HPV DNA cotesting - Plan: Cytology - PAP [place]  Weight loss - Plan: TSH  Need for influenza vaccination - Plan: Flu vaccine HIGH DOSE PF (Fluzone High dose)   c-met, lipids, CBC, TSH, A1c  Discussed monthly self breast exams and yearly mammograms (past due); at least 30 minutes of aerobic activity at least 5 days/week, weight bearing exercise 2x/week; proper sunscreen use reviewed; healthy diet, including goals of calcium and vitamin D intake and alcohol recommendations (less than or equal to 1 drink/day) reviewed; regular seatbelt use; changing batteries in smoke detectors. Immunization recommendations discussed--high dose flu shot given.  Shingrix recommended. Colonoscopy recommendations reviewed--UTD, due 05/2008. Discussed Cologard as option  MOST form reviewed and updated today. Full Code, Full Care.  Call [facility] and schedule both your mammogram and bone density test.  As far as your headaches--we discussed the potential contribution of posture, and stress from the neck muscles going up to the skull/scalp.  Work on the [employer] sitting more upright, supporting your head/neck with a pillow. Try heat vs ice when you have the pain. We can consider physical therapy if persistent/worsening of pain.   Medicare Attestation I have personally reviewed: The patient's medical and social history Their use of alcohol, tobacco or illicit drugs Their current medications and supplements The patient's functional ability including ADLs,fall risks, home safety risks, cognitive, and hearing and visual impairment Diet and physical activities Evidence for depression or mood disorders  The patient's weight, height and BMI have been recorded in the chart.  I have made referrals, counseling, and provided education to the patient based on review of the above and  I have provided the patient with a written personalized care plan for preventive services.

## 2017-08-16 ENCOUNTER — Ambulatory Visit (INDEPENDENT_AMBULATORY_CARE_PROVIDER_SITE_OTHER): Payer: Medicare HMO | Admitting: Family Medicine

## 2017-08-16 ENCOUNTER — Encounter: Payer: Self-pay | Admitting: Family Medicine

## 2017-08-16 ENCOUNTER — Other Ambulatory Visit (HOSPITAL_COMMUNITY)
Admission: RE | Admit: 2017-08-16 | Discharge: 2017-08-16 | Disposition: A | Discharge status: Home / Self Care | Payer: Medicare HMO | Source: Ambulatory Visit | Attending: Family Medicine | Admitting: Family Medicine

## 2017-08-16 VITALS — BP 110/64 | HR 60 | Ht 63.5 in | Wt 152.6 lb

## 2017-08-16 DIAGNOSIS — R87810 Cervical high risk human papillomavirus (HPV) DNA test positive: Secondary | ICD-10-CM

## 2017-08-16 DIAGNOSIS — F325 Major depressive disorder, single episode, in full remission: Secondary | ICD-10-CM

## 2017-08-16 DIAGNOSIS — Z124 Encounter for screening for malignant neoplasm of cervix: Secondary | ICD-10-CM | POA: Diagnosis not present

## 2017-08-16 DIAGNOSIS — R7301 Impaired fasting glucose: Secondary | ICD-10-CM | POA: Diagnosis not present

## 2017-08-16 DIAGNOSIS — F419 Anxiety disorder, unspecified: Secondary | ICD-10-CM

## 2017-08-16 DIAGNOSIS — G4700 Insomnia, unspecified: Secondary | ICD-10-CM

## 2017-08-16 DIAGNOSIS — R69 Illness, unspecified: Secondary | ICD-10-CM | POA: Diagnosis not present

## 2017-08-16 DIAGNOSIS — Z23 Encounter for immunization: Secondary | ICD-10-CM

## 2017-08-16 DIAGNOSIS — E782 Mixed hyperlipidemia: Secondary | ICD-10-CM | POA: Diagnosis not present

## 2017-08-16 DIAGNOSIS — Z0000 Encounter for general adult medical examination without abnormal findings: Secondary | ICD-10-CM | POA: Diagnosis not present

## 2017-08-16 DIAGNOSIS — G250 Essential tremor: Secondary | ICD-10-CM

## 2017-08-16 DIAGNOSIS — K219 Gastro-esophageal reflux disease without esophagitis: Secondary | ICD-10-CM | POA: Diagnosis not present

## 2017-08-16 DIAGNOSIS — Z5181 Encounter for therapeutic drug level monitoring: Secondary | ICD-10-CM

## 2017-08-16 DIAGNOSIS — R634 Abnormal weight loss: Secondary | ICD-10-CM

## 2017-08-16 LAB — POCT GLYCOSYLATED HEMOGLOBIN (HGB A1C): Hemoglobin A1C: 6.2

## 2017-08-16 MED ORDER — SERTRALINE HCL 50 MG PO TABS: 50.0000 mg | ORAL_TABLET | Freq: Every day | ORAL | 1 refills | Status: DC

## 2017-08-16 MED ORDER — PROPRANOLOL HCL ER 80 MG PO CP24
ORAL_CAPSULE | ORAL | 1 refills | Status: DC
Start: 2017-08-16 — End: 2018-02-14

## 2017-08-16 MED ORDER — ROSUVASTATIN CALCIUM 40 MG PO TABS
ORAL_TABLET | ORAL | 1 refills | Status: DC
Start: 2017-08-16 — End: 2018-02-14

## 2017-08-16 NOTE — Patient Instructions (Addendum)
HEALTH MAINTENANCE RECOMMENDATIONS:  It is recommended that you get at least 30 minutes of aerobic exercise at least 5 days/week (for weight loss, you may need as much as 60-90 minutes). This can be any activity that gets your heart rate up. This can be divided in 10-15 minute intervals if needed, but try and build up your endurance at least once a week.  Weight bearing exercise is also recommended twice weekly.  Eat a healthy diet with lots of vegetables, fruits and fiber.  "Colorful" foods have a lot of vitamins (ie green vegetables, tomatoes, red peppers, etc).  Limit sweet tea, regular sodas and alcoholic beverages, all of which has a lot of calories and sugar.  Up to 1 alcoholic drink daily may be beneficial for women (unless trying to lose weight, watch sugars).  Drink a lot of water.  Calcium recommendations are 1200-1500 mg daily (1500 mg for postmenopausal women or women without ovaries), and vitamin D 1000 IU daily.  This should be obtained from diet and/or supplements (vitamins), and calcium should not be taken all at once, but in divided doses.  Monthly self breast exams and yearly mammograms for women over the age of 62 is recommended.  Sunscreen of at least SPF 30 should be used on all sun-exposed parts of the skin when outside between the hours of 10 am and 4 pm (not just when at beach or pool, but even with exercise, golf, tennis, and yard work!)  Use a sunscreen that says "broad spectrum" so it covers both UVA and UVB rays, and make sure to reapply every 1-2 hours.  Remember to change the batteries in your smoke detectors when changing your clock times in the spring and fall.  Use your seat belt every time you are in a car, and please drive safely and not be distracted with cell phones and texting while driving.   Lindsey Frank , Thank you for taking time to come for your Medicare Wellness Visit. I appreciate your ongoing commitment to your health goals. Please review the  following plan we discussed and let me know if I can assist you in the future.   These are the goals we discussed: Goals    None      This is a list of the screening recommended for you and due dates:  Health Maintenance  Topic Date Due  . Flu Shot  05/16/2017  . Mammogram  12/26/2017  . Colon Cancer Screening  05/26/2018  . Tetanus Vaccine  04/17/2020  . DEXA scan (bone density measurement)  Completed  .  Hepatitis C: One time screening is recommended by Center for Disease Control  (CDC) for  adults born from [birth year] through 1964.   Completed  . Pneumonia vaccines  Completed   I recommend getting the new shingles vaccine (Shingrix). You will need to check with your insurance to see if it is covered, and if covered by Medicare Part D, you need to get from the pharmacy rather than our office.  It is a series of 2 injections, spaced 2 months apart.  You were given a high dose flu shot today.    We discussed colonoscopy versus Cologard for colon cancer screening when due again next year.  Call [facility] and schedule both your mammogram and bone density test. You don't need to wait until March (as stated in the date above) for the mammogram--I recommend 3D mammogram yearly. If this bone density test is normal, you additional ones will be needed.  As far as your headaches--we discussed the potential contribution of posture, and stress from the neck muscles going up to the skull/scalp.  Work on the [employer]--try sitting more upright, supporting your head/neck with a pillow. Try heat vs ice when you have the pain. We can consider physical therapy if persistent/worsening of pain.

## 2017-08-17 ENCOUNTER — Encounter: Payer: Self-pay | Admitting: Family Medicine

## 2017-08-17 LAB — CBC WITH DIFFERENTIAL/PLATELET
BASOS ABS: 82 {cells}/uL (ref 0–200)
Basophils Relative: 0.9 %
EOS ABS: 309 {cells}/uL (ref 15–500)
Eosinophils Relative: 3.4 %
HEMATOCRIT: 43.2 % (ref 35.0–45.0)
Hemoglobin: 14.4 g/dL (ref 11.7–15.5)
Lymphs Abs: 3422 cells/uL (ref 850–3900)
MCH: 28.7 pg (ref 27.0–33.0)
MCHC: 33.3 g/dL (ref 32.0–36.0)
MCV: 86.2 fL (ref 80.0–100.0)
MPV: 10.4 fL (ref 7.5–12.5)
Monocytes Relative: 7 %
NEUTROS PCT: 51.1 %
Neutro Abs: 4650 cells/uL (ref 1500–7800)
Platelets: 290 10*3/uL (ref 140–400)
RBC: 5.01 10*6/uL (ref 3.80–5.10)
RDW: 13 % (ref 11.0–15.0)
Total Lymphocyte: 37.6 %
WBC: 9.1 10*3/uL (ref 3.8–10.8)
WBCMIX: 637 {cells}/uL (ref 200–950)

## 2017-08-17 LAB — LIPID PANEL
CHOL/HDL RATIO: 2.6 (calc) (ref <5.0)
CHOLESTEROL: 163 mg/dL (ref <200)
HDL: 62 mg/dL (ref >50)
LDL CHOLESTEROL (CALC): 71 mg/dL
Non-HDL Cholesterol (Calc): 101 mg/dL (calc) (ref <130)
Triglycerides: 208 mg/dL — ABNORMAL HIGH (ref <150)

## 2017-08-17 LAB — COMPREHENSIVE METABOLIC PANEL
AG Ratio: 2.5 (calc) (ref 1.0–2.5)
ALKALINE PHOSPHATASE (APISO): 61 U/L (ref 33–130)
ALT: 15 U/L (ref 6–29)
AST: 23 U/L (ref 10–35)
Albumin: 5 g/dL (ref 3.6–5.1)
BILIRUBIN TOTAL: 0.6 mg/dL (ref 0.2–1.2)
BUN: 23 mg/dL (ref 7–25)
CALCIUM: 9.7 mg/dL (ref 8.6–10.4)
CO2: 27 mmol/L (ref 20–32)
CREATININE: 0.91 mg/dL (ref 0.60–0.93)
Chloride: 105 mmol/L (ref 98–110)
Globulin: 2 g/dL (calc) (ref 1.9–3.7)
Glucose, Bld: 101 mg/dL — ABNORMAL HIGH (ref 65–99)
Potassium: 4.6 mmol/L (ref 3.5–5.3)
Sodium: 142 mmol/L (ref 135–146)
Total Protein: 7 g/dL (ref 6.1–8.1)

## 2017-08-17 LAB — TSH: TSH: 1.48 mIU/L (ref 0.40–4.50)

## 2017-08-21 LAB — CYTOLOGY - PAP
Diagnosis: NEGATIVE
HPV (WINDOPATH): DETECTED — AB

## 2017-08-23 DIAGNOSIS — R69 Illness, unspecified: Secondary | ICD-10-CM | POA: Diagnosis not present

## 2017-10-16 DIAGNOSIS — R87619 Unspecified abnormal cytological findings in specimens from cervix uteri: Secondary | ICD-10-CM

## 2017-10-16 HISTORY — DX: Unspecified abnormal cytological findings in specimens from cervix uteri: R87.619

## 2017-10-18 ENCOUNTER — Other Ambulatory Visit: Payer: Self-pay | Admitting: Family Medicine

## 2017-10-18 DIAGNOSIS — G250 Essential tremor: Secondary | ICD-10-CM

## 2017-10-18 NOTE — Telephone Encounter (Signed)
Is this okay to refill? 

## 2017-11-05 ENCOUNTER — Telehealth: Payer: Self-pay

## 2017-11-05 NOTE — Telephone Encounter (Signed)
Timolol??  I have that she is on inderal LA (propranolol), not timolol

## 2017-11-05 NOTE — Telephone Encounter (Signed)
This was sent to me.

## 2017-11-05 NOTE — Telephone Encounter (Signed)
Pt pharmacyis requesting alternative to timolol maleate 10 mg . Medicine will cost pt $152.00 out of pocket. Pharm[address] is [facility] [address] and their number is [phone]. Thanks [employer]

## 2017-11-06 NOTE — Telephone Encounter (Signed)
I called pharmacy and they have no record of this request?

## 2017-11-07 ENCOUNTER — Telehealth: Payer: Self-pay

## 2017-11-07 NOTE — Telephone Encounter (Signed)
This explains the message from yesterday (which was wrong).  Is patient having a problem with the cost of the propranolol?  She was switched to this from another drug due to cost.  If she isn't having a problem with the cost, and it has been effective, I don't recommend changing anything

## 2017-11-07 NOTE — Telephone Encounter (Signed)
Patient does not want to change-she feels that it is working properly and she does not want to change.

## 2017-11-07 NOTE — Telephone Encounter (Signed)
Pharmacy is requesting lower costing medicine. Pt is currently on propranolol er 80 mg. Pharmacy has provided alternat med Timol at zero cost to her. Thanks [employer]

## 2017-11-07 NOTE — Telephone Encounter (Signed)
This was sent to me.

## 2017-11-08 DIAGNOSIS — R69 Illness, unspecified: Secondary | ICD-10-CM | POA: Diagnosis not present

## 2017-11-09 ENCOUNTER — Telehealth: Payer: Self-pay

## 2017-11-09 DIAGNOSIS — F325 Major depressive disorder, single episode, in full remission: Secondary | ICD-10-CM

## 2017-11-09 DIAGNOSIS — F419 Anxiety disorder, unspecified: Secondary | ICD-10-CM

## 2017-11-09 NOTE — Telephone Encounter (Signed)
Pharmacy is requesting a refill for Sertaline HCL 50 mg. Pt last office visit was 08-16-17 and next appt is 02-14-18.[facility]act [facility]  [address] . Thanks [employer]

## 2017-11-12 MED ORDER — SERTRALINE HCL 50 MG PO TABS: 50.0000 mg | ORAL_TABLET | Freq: Every day | ORAL | 1 refills | Status: DC

## 2017-11-12 NOTE — Telephone Encounter (Signed)
Done

## 2017-12-13 DIAGNOSIS — H43813 Vitreous degeneration, bilateral: Secondary | ICD-10-CM | POA: Diagnosis not present

## 2017-12-13 DIAGNOSIS — H04123 Dry eye syndrome of bilateral lacrimal glands: Secondary | ICD-10-CM | POA: Diagnosis not present

## 2017-12-13 DIAGNOSIS — H25813 Combined forms of age-related cataract, bilateral: Secondary | ICD-10-CM | POA: Diagnosis not present

## 2017-12-13 DIAGNOSIS — H524 Presbyopia: Secondary | ICD-10-CM | POA: Diagnosis not present

## 2018-01-02 HISTORY — PX: CATARACT EXTRACTION: SUR2

## 2018-01-14 ENCOUNTER — Other Ambulatory Visit: Payer: Self-pay | Admitting: Family Medicine

## 2018-01-14 DIAGNOSIS — G250 Essential tremor: Secondary | ICD-10-CM

## 2018-01-22 DIAGNOSIS — H25812 Combined forms of age-related cataract, left eye: Secondary | ICD-10-CM | POA: Diagnosis not present

## 2018-01-22 DIAGNOSIS — H268 Other specified cataract: Secondary | ICD-10-CM | POA: Diagnosis not present

## 2018-02-04 ENCOUNTER — Other Ambulatory Visit: Payer: Self-pay | Admitting: Family Medicine

## 2018-02-04 DIAGNOSIS — E782 Mixed hyperlipidemia: Secondary | ICD-10-CM

## 2018-02-04 DIAGNOSIS — G250 Essential tremor: Secondary | ICD-10-CM

## 2018-02-12 NOTE — Progress Notes (Signed)
Chief Complaint  Patient presents with  . Hyperlipidemia    med check, patient is not fasting. Patient states that she called and was told no fasting was needed. No concerns.     She was complaining of headaches at her wellness visit 6 months ago--different from her migraines (line of pain following the part on her left side of her head, going all the way back to her neck, when she is sitting on the couch to watch TV at the end of the day).  We had discussed the potential contribution of posture, and stress from the neck muscles going up to the skull/scalp. She reports she made changes and no longer having that pain.  Impaired fasting glucose: She reports she continues to limit her portions of chocolate (just 4 kisses/day, or a piece of a Hershey's bar). Avoids other sweets. Eats Chex cereal (honey nut) mostly for breakfast (no longer having muffins). She doesn't each much rice, pasta. Some sandwiches, on whole wheat bread. She had been walking almost amile a day, 6 days/week, including a hill (20 minutes).  Also busy with landscaping/gardening now. Lab Results  Component Value Date   HGBA1C 6.2 08/16/2017   She has a boyfriend, is happier. She lost weight, as did her boyfriend. Eating healthy diet, continues to limit chocolate.  She and her boyfriend have not yet had intercourse. She is concerned about the HPV found on her pap smear, and has questions.   Mixed hyperlipidemia: TG hasbeen elevated multiple times in the past.  She reports being compliant with herCrestor. Patient is reportedly following a low-fat, low cholesterol diet. Compliant with medications and denies medication side effects Doesn't take fish oil, due to causing bad breath. Lab Results  Component Value Date   CHOL 163 08/16/2017   HDL 62 08/16/2017   LDLCALC 71 08/16/2017   TRIG 208 (H) 08/16/2017   CHOLHDL 2.6 08/16/2017    Tremor--Tolerating the propranolol without any side effects, along with the clonazepam  (previously took nadolol, changed due to cost. She does still have some persistent tremor -- notices it is a little harder to text on her phone, needs to use both hands with a full glass, but is satisfied with the results from the medication.  She previously tried mysoline, but didn't tolerate it ("sick all day after one pill").  GERD--She takes Prilosec OTC daily, and denies symptoms.She had recurrent heartburn when she tried cutting back to every other day--symptoms on the day she didn't take it.  Denies dysphagia.  Depression--she stopped Sertraline last year, didn't haveany recurrent depression afterstopping but she had problems with insomnia. OTC meds didn't help. We tried trazadone which made her nauseated, couldn't tolerate it, so went back on the sertraline. She is taking 26m daily. Sleep is much better. Denies depression.  (She had gone off meds in the past, and had recurrence of depression, some of which patient related to work stress. (no longer working). There are 5 people in her family who committed suicide.)  PMH, PSH, SH reviewed  Outpatient Encounter Medications as of 02/14/2018  Medication Sig Note  . Calcium Carbonate-Vitamin D (CALCIUM + D) 600-200 MG-UNIT TABS Take 1,200 mg by mouth daily.     . clonazePAM (KLONOPIN) 1 MG tablet TAKE 1 TABLET BY MOUTH TWICE A DAY   . omeprazole (PRILOSEC OTC) 20 MG tablet Take 20 mg by mouth daily.   . prednisoLONE acetate (PRED FORTE) 1 % ophthalmic suspension INSTILL 1 DROP INTO AFFECTED EYE 4 TIMES A DAY  02/14/2018: bid  . PROLENSA 0.07 % SOLN INSTILL 1 DROP INTO AFFECTED EYE DAILY. BEGIN 1 DAY AFTER SURGERY   . propranolol ER (INDERAL LA) 80 MG 24 hr capsule Take 1 CAPSULE(80 MG) BY MOUTH DAILY   . rosuvastatin (CRESTOR) 40 MG tablet TAKE 1 TABLET(40 MG) BY MOUTH DAILY   . sertraline (ZOLOFT) 50 MG tablet Take 1 tablet (50 mg total) by mouth daily.   Marland Kitchen aspirin 81 MG tablet Take 81 mg by mouth daily.   .  diphenhydramine-acetaminophen (TYLENOL PM) 25-500 MG TABS tablet Take 2 tablets by mouth at bedtime as needed. 08/16/2017: Uses prn, usually if has a headache when going to bed   No facility-administered encounter medications on file as of 02/14/2018.    ROS: no headaches, dizziness, fever, chills, URI symptoms, allergies, cough, shortness of breath, chest pain, palpitations, GI or GU complaints, bleeding, bruising, rash, depression. Insomnia is well controlled, mild tremor is stable.  PHYSICAL EXAM:  BP 128/74   Pulse 64   Ht 5' 3.5" (1.613 m)   Wt 144 lb 3.2 oz (65.4 kg)   BMI 25.14 kg/m   Wt Readings from Last 3 Encounters:  02/14/18 144 lb 3.2 oz (65.4 kg)  08/16/17 152 lb 9.6 oz (69.2 kg)  01/18/17 156 lb (70.8 kg)    Well developed, pleasant female in good spirits.  HEENT: PERRL, EOMI, conjunctiva clear, OP clear Neck: no lymphadenopathy, thyromegaly or bruit Heart: regular rate and rhythm, no murmur Lungs: clear bilaterally Abdomen: soft, nontender, no mass Extremities: no edema, 2+ pulse Neuro: alert and oriented. Cranial nerves intact. Normal gait.  No significant tremor noted in hands or any head bobbing. Psych: normal mood, affect, hygiene and grooming Skin: normal turgor, no visible rashes/lesions  Lab Results  Component Value Date   HGBA1C 5.8 02/14/2018    ASSESSMENT/PLAN:  Mixed hyperlipidemia - Plan: rosuvastatin (CRESTOR) 40 MG tablet, Lipid panel  Impaired fasting glucose - improved; continue healthy diet, limiting sweets, weight loss, exercise - Plan: HgB A1c, Comprehensive metabolic panel  Benign essential tremor - well controlled - Plan: propranolol ER (INDERAL LA) 80 MG 24 hr capsule  Gastroesophageal reflux disease, esophagitis presence not specified - well controlled  Depression, major, in remission ([place]) - on medication due to insomnia when meds were stopped, NOT recurrent depression. This is effective in helping her sleep (and didn't tolerate  trazadone)  Benign essential tremor - stable/tolerable. continue current regimen - Plan: propranolol ER (INDERAL LA) 80 MG 24 hr capsule  Medication monitoring encounter - Plan: Lipid panel, Comprehensive metabolic panel  Insomnia, unspecified type - well controlled on 82m sertraline  Pap smear of cervix shows high risk HPV present - repeat due in November   A1c Return for c-met, lipid, as she isn't fasting today.  Condoms recommended for prevention of spread of HPV. Reminded about need for yearly pap smears (and potential referral to GYN if any abnormalities in the cells).

## 2018-02-14 ENCOUNTER — Ambulatory Visit (INDEPENDENT_AMBULATORY_CARE_PROVIDER_SITE_OTHER): Payer: Managed Care, Other (non HMO) | Admitting: Family Medicine

## 2018-02-14 ENCOUNTER — Encounter: Payer: Self-pay | Admitting: Family Medicine

## 2018-02-14 VITALS — BP 128/74 | HR 64 | Ht 63.5 in | Wt 144.2 lb

## 2018-02-14 DIAGNOSIS — R87810 Cervical high risk human papillomavirus (HPV) DNA test positive: Secondary | ICD-10-CM

## 2018-02-14 DIAGNOSIS — R69 Illness, unspecified: Secondary | ICD-10-CM | POA: Diagnosis not present

## 2018-02-14 DIAGNOSIS — G4700 Insomnia, unspecified: Secondary | ICD-10-CM | POA: Diagnosis not present

## 2018-02-14 DIAGNOSIS — E782 Mixed hyperlipidemia: Secondary | ICD-10-CM | POA: Diagnosis not present

## 2018-02-14 DIAGNOSIS — Z5181 Encounter for therapeutic drug level monitoring: Secondary | ICD-10-CM | POA: Diagnosis not present

## 2018-02-14 DIAGNOSIS — R7301 Impaired fasting glucose: Secondary | ICD-10-CM | POA: Diagnosis not present

## 2018-02-14 DIAGNOSIS — G250 Essential tremor: Secondary | ICD-10-CM

## 2018-02-14 DIAGNOSIS — K219 Gastro-esophageal reflux disease without esophagitis: Secondary | ICD-10-CM

## 2018-02-14 DIAGNOSIS — F325 Major depressive disorder, single episode, in full remission: Secondary | ICD-10-CM

## 2018-02-14 LAB — POCT GLYCOSYLATED HEMOGLOBIN (HGB A1C): Hemoglobin A1C: 5.8

## 2018-02-14 MED ORDER — PROPRANOLOL HCL ER 80 MG PO CP24: ORAL_CAPSULE | ORAL | 1 refills | Status: DC

## 2018-02-14 MED ORDER — ROSUVASTATIN CALCIUM 40 MG PO TABS: ORAL_TABLET | ORAL | 1 refills | Status: DC

## 2018-02-14 NOTE — Patient Instructions (Signed)
Keep up the good work--healthy diet, regular exercise, and your current medications. Return for fasting labs.  That will let me know if your triglycerides remain elevated or if they are better.  If you become sexually active, be sure to use condoms--both for your protection, and to prevent spread of HPV.

## 2018-03-01 DIAGNOSIS — Z961 Presence of intraocular lens: Secondary | ICD-10-CM | POA: Diagnosis not present

## 2018-03-04 ENCOUNTER — Telehealth: Payer: Self-pay

## 2018-03-04 DIAGNOSIS — Z5181 Encounter for therapeutic drug level monitoring: Secondary | ICD-10-CM

## 2018-03-04 DIAGNOSIS — E782 Mixed hyperlipidemia: Secondary | ICD-10-CM

## 2018-03-04 DIAGNOSIS — R7301 Impaired fasting glucose: Secondary | ICD-10-CM

## 2018-03-04 NOTE — Telephone Encounter (Signed)
I called patient and let her know that she needs orders in order to go to [facility]. I am printing these and mailing to her.

## 2018-03-04 NOTE — Telephone Encounter (Signed)
Patient called to cancel her lab appointment and wanted you and Dr Lynelle Doctor know that she will go to [facility] to have her labs done.   Thank you

## 2018-03-06 ENCOUNTER — Other Ambulatory Visit: Payer: Managed Care, Other (non HMO)

## 2018-04-12 ENCOUNTER — Other Ambulatory Visit: Payer: Self-pay | Admitting: Family Medicine

## 2018-04-12 DIAGNOSIS — G250 Essential tremor: Secondary | ICD-10-CM

## 2018-04-12 NOTE — Telephone Encounter (Signed)
[facility] is requesting to fill pt klonopin. Please advise KH 

## 2018-07-15 ENCOUNTER — Other Ambulatory Visit: Payer: Self-pay | Admitting: Family Medicine

## 2018-07-15 DIAGNOSIS — G250 Essential tremor: Secondary | ICD-10-CM

## 2018-07-15 NOTE — Telephone Encounter (Signed)
Is this okay to refill? 

## 2018-08-04 ENCOUNTER — Other Ambulatory Visit: Payer: Self-pay | Admitting: Family Medicine

## 2018-08-04 DIAGNOSIS — F419 Anxiety disorder, unspecified: Secondary | ICD-10-CM

## 2018-08-04 DIAGNOSIS — F325 Major depressive disorder, single episode, in full remission: Secondary | ICD-10-CM

## 2018-08-04 DIAGNOSIS — G250 Essential tremor: Secondary | ICD-10-CM

## 2018-08-04 DIAGNOSIS — E782 Mixed hyperlipidemia: Secondary | ICD-10-CM

## 2018-08-18 NOTE — Progress Notes (Signed)
Chief Complaint  Patient presents with  . Medicare Wellness    fasting AWV with pap. Did not have labs at [facility] or mammogram this year. Will try to give UA on way out, could not do. Gets eyes checked regularly. No new concerns.     Lindsey Frank is a 72 y.o. female who presents for annual physical exam, Medicare wellness visit and follow-up on chronic medical conditions.   Impaired fasting glucose: She reportsshe continues to limither portions of chocolate (just 4 kisses/day, or a piece of a Hershey's bar). Avoids other sweets.  She doesn't each much rice, pasta. Some sandwiches, on whole wheat bread, but is trying to have more salads. Doesn't eat a lot of starchy vegetables. She walks almost amile a day,6days/week, including a hill (20 minutes).  She has lost a little more weight Lab Results  Component Value Date   HGBA1C 5.8 02/14/2018   No longer in relationship with the boyfriend, never had sexual intercourse (so no need for STD screen at today's visit).  Mixed hyperlipidemia: TG hasbeenelevated multiple times in the past. She reports being compliant with herCrestor. Patient is reportedly following a low-fat, low cholesterol diet. Compliant with medications and denies medication side effects. Doesn't take fish oil, due to causing bad breath. She wasn't fasting for her labs in April, and she never returned (or went to [facility]) to get them done. Lab Results  Component Value Date   CHOL 163 08/16/2017   HDL 62 08/16/2017   LDLCALC 71 08/16/2017   TRIG 208 (H) 08/16/2017   CHOLHDL 2.6 08/16/2017    Tremor--Tolerating propranolol without any side effects, along with the clonazepam (previously took nadolol, changed due to cost.) She does still have some persistent tremor--notices it is a little harder to text on her phone, needs to use both hands with a full glass, but is satisfied with the results from the medication. Has not changed in years (but she always asks if she  can increase the clonazepam to 3/day, asked again today).   She previously tried mysoline, but didn't tolerate it ("sick all day after one pill").  GERD--She takes Prilosec OTC daily, and denies symptoms.She has recurrent heartburn if she misses a pill (relieved by standing up and drinking a large glass of water. Denies dysphagia.  Depression--she stopped Sertraline a couple of years ago, didn't haveany recurrent depression afterstopping (has had recurrent depression when stopped in the past, related to work stress), but she had problems with insomnia. OTC meds didn't help. We tried trazadonewhichmade her nauseated, couldn't tolerate it, so went back on the sertraline. She is taking 50mg  daily. Sleep is much better. Denies depression. 5 people in her family committed suicide.   Immunization History  Administered Date(s) Administered  . Influenza Split 08/16/2012, 07/28/2014  . Influenza, High Dose Seasonal PF 06/16/2015, 07/05/2016, 08/16/2017  . Pneumococcal Conjugate-13 05/27/2014  . Pneumococcal Polysaccharide-23 04/17/2010, 07/05/2016  . Td 10/16/2000  . Tdap 04/17/2010  . Zoster 03/03/2007   Last Pap smear: 08/2017, normal, but +HPV noted.  This was also seen on 05/2014 pap. Last mammogram:12/2015 Last colonoscopy: 8/09  Last DEXA: 10/2004 --repeat testing has been recommended, but never scheduled by pt.  Exercise: walks 20 minutes 6 days/week.No weight-bearing exercise. Goes to dentist every 6 months  Ophtho: regularly  Other doctors caring for patient include: Dentist (Dr. Hortencia Pilar)  Ophtho (Dr. Dagoberto Ligas)  GI (Dr. Madilyn Fireman at [facility], 2009)  Neuro (Dr. Frances Furbish), no longer sees.  Fall screen: None Depression screen: negative Functional  Status Survey:  Only occasionally has a problem recalling things (mainly related to TV shows, characters that have been in other shows, hard to recall).   Mini-Cog screen:  Normal  End of Life Discussion: Patient hasa living  will and medical power of attorney.   Past Medical History:  Diagnosis Date  . Depression   . Elevated C-reactive protein (CRP) 01/2005  . Elevated cholesterol 01/2005  . Elevated triglycerides with high cholesterol 01/2005  . GERD (gastroesophageal reflux disease)   . History of endometriosis   . Migraine   . Tremor, essential     Past Surgical History:  Procedure Laterality Date  . ABDOMINAL ADHESION SURGERY  1981  . APPENDECTOMY  1977  . CATARACT EXTRACTION Left 01/02/2018   Dr. Dagoberto Ligas  . COLONOSCOPY  05/26/2008  . LAPAROSCOPY  1981   endometriosis  . plastic surgery-neck  2008  . r breast lump  1992   benign  . TONSILECTOMY, ADENOIDECTOMY, BILATERAL MYRINGOTOMY AND TUBES  1953   age 82  . UTERINE FIBROID SURGERY  1977    Social History   Socioeconomic History  . Marital status: Divorced    Spouse name: Not on file  . Number of children: 1  . Years of education: college  . Highest education level: Not on file  Occupational History  . 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 Financial resource strain: Not on file  . Food insecurity:    Worry: Not on file    Inability: Not on file  . Transportation needs:    Medical: Not on file    Non-medical: Not on file  Tobacco Use  . Smoking status: Former Smoker    Packs/day: 0.25    Years: 30.00    Pack years: 7.50    Types: Cigarettes  . Smokeless tobacco: Never Used  . Tobacco comment: quit 12/2014, relapsed with stress, quit again end of July 2016  Substance and Sexual Activity  . Alcohol use: Not Currently    Alcohol/week: 0.0 standard drinks    Comment: (occasional [place])  . Drug use: No  . Sexual activity: Not Currently  Lifestyle  . Physical activity:    Days per week: Not on file    Minutes per session: Not on file  . Stress: Not on file  Relationships  . Social connections:    Talks on phone: Not on file    Gets together: Not on file    Attends  religious service: Not on file    Active member of club or organization: Not on file    Attends meetings of clubs or organizations: Not on file    Relationship status: Not on file  . Intimate partner violence:    Fear of current or ex partner: Not on file    Emotionally abused: Not on file    Physically abused: Not on file    Forced sexual activity: Not on file  Other Topics Concern  . Not on file  Social History Narrative   Lives alone.  Son lives in [place], 4 grandchildren   College Education   Right handed   Caffeine one cup daily (tea with milk).    Family History  Problem Relation Age of Onset  . Diabetes Mother   . Cancer Father        throat  . Heart disease Father 14  . Stroke Father 64  . Depression Brother   . Cancer Maternal  Aunt        mult aunts with CA--uterine, stomach  . Alzheimer's disease Maternal Uncle   . Depression Maternal Uncle        suicide    Outpatient Encounter Medications as of 08/19/2018  Medication Sig Note  . aspirin 81 MG tablet Take 81 mg by mouth daily.   . Calcium Carbonate-Vitamin D (CALCIUM + D) 600-200 MG-UNIT TABS Take 600 mg by mouth 2 (two) times daily.    . clonazePAM (KLONOPIN) 1 MG tablet TAKE 1 TABLET BY MOUTH TWICE A DAY   . omeprazole (PRILOSEC OTC) 20 MG tablet Take 20 mg by mouth daily.   . propranolol ER (INDERAL LA) 80 MG 24 hr capsule TAKE 1 CAPSULE(80 MG) BY MOUTH DAILY   . rosuvastatin (CRESTOR) 40 MG tablet TAKE 1 TABLET(40 MG) BY MOUTH DAILY   . sertraline (ZOLOFT) 50 MG tablet TAKE 1 TABLET BY MOUTH EVERY DAY   . diphenhydramine-acetaminophen (TYLENOL PM) 25-500 MG TABS tablet Take 2 tablets by mouth at bedtime as needed. 08/16/2017: Uses prn, usually if has a headache when going to bed  . [DISCONTINUED] prednisoLONE acetate (PRED FORTE) 1 % ophthalmic suspension INSTILL 1 DROP INTO AFFECTED EYE 4 TIMES A DAY 02/14/2018: bid  . [DISCONTINUED] PROLENSA 0.07 % SOLN INSTILL 1 DROP INTO AFFECTED EYE DAILY. BEGIN 1 DAY  AFTER SURGERY    No facility-administered encounter medications on file as of 08/19/2018.     Allergies  Allergen Reactions  . Trazodone And Nefazodone Nausea Only  . Mysoline [Primidone] Nausea Only    Nausea and achey    ROS: The patient denies anorexia, fever, vision changes, decreased hearing, ear pain, sore throat, breast concerns, chest pain, palpitations, dizziness, syncope, dyspnea on exertion, cough, swelling, nausea, vomiting, diarrhea, constipation, abdominal pain, melena, hematochezia, indigestion/heartburn, hematuria, incontinence, dysuria, vaginal bleeding, discharge, odor or itch, genital lesions, joint pains, numbness, tingling, weakness, depression, anxiety, abnormal bleeding/bruising, or enlarged lymph nodes. Some postnasal drainage, chronic (since childhood--doesn't desire treatment). She has maintained her weight loss. Tremor per HPI Insomnia is well controlled.    PHYSICAL EXAM:  BP 112/70   Pulse 60   Ht 5\' 4"  (1.626 m)   Wt 142 lb 3.2 oz (64.5 kg)   BMI 24.41 kg/m   Wt Readings from Last 3 Encounters:  08/19/18 142 lb 3.2 oz (64.5 kg)  02/14/18 144 lb 3.2 oz (65.4 kg)  08/16/17 152 lb 9.6 oz (69.2 kg)    General Appearance:  Alert, cooperative, no distress, appears stated age. She is sitting with very poor posture, appears somewhat kyphotic (but can straighten fairly well when asked), with neck flexed. When seated on table, during ENT exam, she was leaning back a little, and ended up drifting backwards and laying down.  Happened twice--suspect poor abdominal/core tone, and also some avoidance, leaning back.  Head:  Normocephalic, without obvious abnormality, atraumatic.  Eyes:  PERRL, conjunctiva/corneas clear, EOM's intact, fundi benign   Ears:  Normal TM's and external ear canals   Nose:  Nares normal, mucosa normal, no drainage or sinus tenderness   Throat:  Lips, mucosa, and tongue normal; teeth and gums normal.   Neck:  Supple, no  lymphadenopathy; thyroid: no enlargement/tenderness/ nodules; no carotid bruit or JVD   Back:  Spine nontender, no CVA tenderness   Lungs:  Clear to auscultation bilaterally without wheezes, rales or ronchi; respirations unlabored   Chest Wall:  No tenderness or deformity   Heart:  Regular rate and rhythm, S1  and S2 normal, no murmur, rub or gallop   Breast Exam:  No tenderness, masses, or nipple discharge or inversion. No axillary lymphadenopathy   Abdomen:  Soft, non-tender, nondistended, normoactive bowel sounds,  no masses, no hepatosplenomegaly   Genitalia:  Normal external genitalia without lesions. BUS and vagina normal, atrophic changes noted. Cervix is without lesions, discharge; no cervical motion tenderness. No abnormal vaginal discharge. Uterus and adnexa not enlarged, nontender, no masses. Pap performed. 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   Rectal:  Normal tone, no masses or tenderness; guaiac negative stool, large amount of firm stool in vault.  Extremities:  No clubbing, cyanosis or edema   Pulses:  2+ and symmetric all extremities   Skin:  Skin color, texture, turgor normal, no rashes or lesions.  Lymph nodes:  Cervical, supraclavicular, and axillary nodes normal   Neurologic:   CN II-XII intact, normal strength, sensation and gait; reflexes 2-3+ and symmetric throughout. Mild tremor of hands notedwith writing       Psych:  Normal mood, affect, hygiene and grooming      Lab Results  Component Value Date   HGBA1C 5.7 (A) 08/19/2018    ASSESSMENT/PLAN:  Annual physical exam - Plan: Lipid panel, Comprehensive metabolic panel, CBC with Differential/Platelet, Cytology - PAP([place]), POCT Urinalysis DIP (Proadvantage Device)  Medicare annual wellness visit, subsequent  Encounter for Pap smear of cervix with HPV DNA cotesting - pap repeated.  Refer to GYN if HPV persists, especially since cervical os is stenotic - Plan: Cytology -  PAP([place])  Pap smear of cervix shows high risk HPV present - Plan: Cytology - PAP([place])  Impaired fasting glucose - stable/improved.  continue exercise, diet, maintain weight loss - Plan: HgB A1c, Comprehensive metabolic panel  Mixed hyperlipidemia - recheck today. TG has been elevated in past, intolerant of fish oil - Plan: Lipid panel  Benign essential tremor - controlled on current regimen. risks/SE of benzo's reviewed, and reasons for not increasing dose.  Gastroesophageal reflux disease, esophagitis presence not specified - controlled, requires daily PPI  Depression, major, in remission (H[facility]) - recurrent insomnia when SSRI stopped; FHx suicide; continue long-term  Insomnia, unspecified type - controlled since sertraline restarted  Medication monitoring encounter - Plan: Lipid panel, Comprehensive metabolic panel, CBC with Differential/Platelet  Need for influenza vaccination - Plan: Flu vaccine HIGH DOSE PF (Fluzone High dose)  Colon cancer screening - Plan: Cologuard  Discussed reasons why we aren't increasing the clonazepam to 3/day.   Discussed monthly self breast exams and yearly mammograms (past due, reminded to schedule); at least 30 minutes of aerobic activity at least 5 days/week, weight bearing exercise 2x/week; proper sunscreen use reviewed; healthy diet, including goals of calcium and vitamin D intake and alcohol recommendations (less than or equal to 1 drink/day) reviewed; regular seatbelt use; changing batteries in smoke detectors. Immunization recommendations discussed--high dose flu shot given.  Shingrix recommended, risks/side effects reviewed, to get from pharmacy. Colon cancer screening recommendations reviewed, due now. Discussed Cologuard vs colonoscopy. Prefers Cologuard.  MOST form reviewed and updated today. Full Code, Full Care.   Medicare Attestation I have personally reviewed: The patient's medical and social history Their use of  alcohol, tobacco or illicit drugs Their current medications and supplements The patient's functional ability including ADLs,fall risks, home safety risks, cognitive, and hearing and visual impairment Diet and physical activities Evidence for depression or mood disorders  The patient's weight, height and BMI have been recorded in the chart.  I have made referrals,  counseling, and provided education to the patient based on review of the above and I have provided the patient with a written personalized care plan for preventive services.

## 2018-08-18 NOTE — Patient Instructions (Addendum)
  HEALTH MAINTENANCE RECOMMENDATIONS:  It is recommended that you get at least 30 minutes of aerobic exercise at least 5 days/week (for weight loss, you may need as much as 60-90 minutes). This can be any activity that gets your heart rate up. This can be divided in 10-15 minute intervals if needed, but try and build up your endurance at least once a week.  Weight bearing exercise is also recommended twice weekly.  Eat a healthy diet with lots of vegetables, fruits and fiber.  "Colorful" foods have a lot of vitamins (ie green vegetables, tomatoes, red peppers, etc).  Limit sweet tea, regular sodas and alcoholic beverages, all of which has a lot of calories and sugar.  Up to 1 alcoholic drink daily may be beneficial for women (unless trying to lose weight, watch sugars).  Drink a lot of water.  Calcium recommendations are 1200-1500 mg daily (1500 mg for postmenopausal women or women without ovaries), and vitamin D 1000 IU daily.  This should be obtained from diet and/or supplements (vitamins), and calcium should not be taken all at once, but in divided doses.  Monthly self breast exams and yearly mammograms for women over the age of 24 is recommended.  Sunscreen of at least SPF 30 should be used on all sun-exposed parts of the skin when outside between the hours of 10 am and 4 pm (not just when at beach or pool, but even with exercise, golf, tennis, and yard work!)  Use a sunscreen that says "broad spectrum" so it covers both UVA and UVB rays, and make sure to reapply every 1-2 hours.  Remember to change the batteries in your smoke detectors when changing your clock times in the spring and fall.  Use your seat belt every time you are in a car, and please drive safely and not be distracted with cell phones and texting while driving.    Lindsey Frank , Thank you for taking time to come for your Medicare Wellness Visit. I appreciate your ongoing commitment to your health goals. Please review the  following plan we discussed and let me know if I can assist you in the future.   This is a list of the screening recommended for you and due dates:  Health Maintenance  Topic Date Due  . Mammogram  12/26/2017  . Flu Shot  05/16/2018  . Colon Cancer Screening  05/26/2018  . Tetanus Vaccine  04/17/2020  . DEXA scan (bone density measurement)  Completed  .  Hepatitis C: One time screening is recommended by Center for Disease Control  (CDC) for  adults born from [birth year] through 1964.   Completed  . Pneumonia vaccines  Completed   We gave you flu shot today.  I recommend getting the new shingles vaccine (Shingrix). You will need to check with your insurance to see if it is covered, and if covered by Medicare Part D, you need to get from the pharmacy rather than our office.  It is a series of 2 injections, spaced 2 months apart.  Mammogram is past due, please schedule a 3D mammogram at [facility]. I recommend that you also schedule bone density test for the same day (they can send/fax order to be signed).  Colon cancer screening is now due.  Options discussed included colonoscopy and Cologuard testing (which, if positive, will need colonoscopy to further evaluate the abnormal test). You elected to try the cologuard test. Let us know if you change your mind.

## 2018-08-19 ENCOUNTER — Encounter: Payer: Self-pay | Admitting: Family Medicine

## 2018-08-19 ENCOUNTER — Ambulatory Visit (INDEPENDENT_AMBULATORY_CARE_PROVIDER_SITE_OTHER): Payer: Medicare HMO | Admitting: Family Medicine

## 2018-08-19 ENCOUNTER — Other Ambulatory Visit (HOSPITAL_COMMUNITY)
Admission: RE | Admit: 2018-08-19 | Discharge: 2018-08-19 | Disposition: A | Discharge status: Home / Self Care | Payer: Medicare HMO | Source: Ambulatory Visit | Attending: Family Medicine | Admitting: Family Medicine

## 2018-08-19 VITALS — BP 112/70 | HR 60 | Ht 64.0 in | Wt 142.2 lb

## 2018-08-19 DIAGNOSIS — Z124 Encounter for screening for malignant neoplasm of cervix: Secondary | ICD-10-CM

## 2018-08-19 DIAGNOSIS — F325 Major depressive disorder, single episode, in full remission: Secondary | ICD-10-CM

## 2018-08-19 DIAGNOSIS — R87810 Cervical high risk human papillomavirus (HPV) DNA test positive: Secondary | ICD-10-CM

## 2018-08-19 DIAGNOSIS — Z1211 Encounter for screening for malignant neoplasm of colon: Secondary | ICD-10-CM | POA: Diagnosis not present

## 2018-08-19 DIAGNOSIS — Z0000 Encounter for general adult medical examination without abnormal findings: Secondary | ICD-10-CM | POA: Insufficient documentation

## 2018-08-19 DIAGNOSIS — Z5181 Encounter for therapeutic drug level monitoring: Secondary | ICD-10-CM | POA: Diagnosis not present

## 2018-08-19 DIAGNOSIS — K219 Gastro-esophageal reflux disease without esophagitis: Secondary | ICD-10-CM | POA: Diagnosis not present

## 2018-08-19 DIAGNOSIS — G250 Essential tremor: Secondary | ICD-10-CM

## 2018-08-19 DIAGNOSIS — R7301 Impaired fasting glucose: Secondary | ICD-10-CM

## 2018-08-19 DIAGNOSIS — E782 Mixed hyperlipidemia: Secondary | ICD-10-CM | POA: Diagnosis not present

## 2018-08-19 DIAGNOSIS — G4700 Insomnia, unspecified: Secondary | ICD-10-CM

## 2018-08-19 DIAGNOSIS — R69 Illness, unspecified: Secondary | ICD-10-CM | POA: Diagnosis not present

## 2018-08-19 DIAGNOSIS — Z23 Encounter for immunization: Secondary | ICD-10-CM | POA: Diagnosis not present

## 2018-08-19 LAB — POCT URINALYSIS DIP (PROADVANTAGE DEVICE)
BILIRUBIN UA: NEGATIVE
Blood, UA: NEGATIVE
GLUCOSE UA: NEGATIVE mg/dL
Ketones, POC UA: NEGATIVE mg/dL
Leukocytes, UA: NEGATIVE
NITRITE UA: NEGATIVE
Protein Ur, POC: NEGATIVE mg/dL
SPECIFIC GRAVITY, URINE: 1.025
Urobilinogen, Ur: NEGATIVE
pH, UA: 5.5 (ref 5.0–8.0)

## 2018-08-19 LAB — POCT GLYCOSYLATED HEMOGLOBIN (HGB A1C): HEMOGLOBIN A1C: 5.7 % — AB (ref 4.0–5.6)

## 2018-08-20 LAB — CBC WITH DIFFERENTIAL/PLATELET
BASOS: 1 %
Basophils Absolute: 0.1 10*3/uL (ref 0.0–0.2)
EOS (ABSOLUTE): 0.3 10*3/uL (ref 0.0–0.4)
Eos: 3 %
Hematocrit: 41.3 % (ref 34.0–46.6)
Hemoglobin: 13.8 g/dL (ref 11.1–15.9)
IMMATURE GRANULOCYTES: 0 %
Immature Grans (Abs): 0 10*3/uL (ref 0.0–0.1)
Lymphocytes Absolute: 3.6 10*3/uL — ABNORMAL HIGH (ref 0.7–3.1)
Lymphs: 42 %
MCH: 29.6 pg (ref 26.6–33.0)
MCHC: 33.4 g/dL (ref 31.5–35.7)
MCV: 89 fL (ref 79–97)
MONOS ABS: 0.7 10*3/uL (ref 0.1–0.9)
Monocytes: 9 %
NEUTROS PCT: 45 %
Neutrophils Absolute: 3.8 10*3/uL (ref 1.4–7.0)
Platelets: 234 10*3/uL (ref 150–450)
RBC: 4.66 x10E6/uL (ref 3.77–5.28)
RDW: 13.2 % (ref 12.3–15.4)
WBC: 8.5 10*3/uL (ref 3.4–10.8)

## 2018-08-20 LAB — LIPID PANEL
CHOL/HDL RATIO: 2.5 ratio (ref 0.0–4.4)
CHOLESTEROL TOTAL: 160 mg/dL (ref 100–199)
HDL: 64 mg/dL (ref >39)
LDL CALC: 70 mg/dL (ref 0–99)
Triglycerides: 132 mg/dL (ref 0–149)
VLDL CHOLESTEROL CAL: 26 mg/dL (ref 5–40)

## 2018-08-20 LAB — COMPREHENSIVE METABOLIC PANEL
ALK PHOS: 57 IU/L (ref 39–117)
ALT: 12 IU/L (ref 0–32)
AST: 23 IU/L (ref 0–40)
Albumin/Globulin Ratio: 2.9 — ABNORMAL HIGH (ref 1.2–2.2)
Albumin: 5.2 g/dL — ABNORMAL HIGH (ref 3.5–4.8)
BILIRUBIN TOTAL: 0.4 mg/dL (ref 0.0–1.2)
BUN / CREAT RATIO: 19 (ref 12–28)
BUN: 19 mg/dL (ref 8–27)
CHLORIDE: 103 mmol/L (ref 96–106)
CO2: 25 mmol/L (ref 20–29)
Calcium: 10.1 mg/dL (ref 8.7–10.3)
Creatinine, Ser: 0.99 mg/dL (ref 0.57–1.00)
GFR calc Af Amer: 66 mL/min/{1.73_m2} (ref >59)
GFR calc non Af Amer: 58 mL/min/{1.73_m2} — ABNORMAL LOW (ref >59)
GLOBULIN, TOTAL: 1.8 g/dL (ref 1.5–4.5)
GLUCOSE: 90 mg/dL (ref 65–99)
Potassium: 4.7 mmol/L (ref 3.5–5.2)
SODIUM: 142 mmol/L (ref 134–144)
Total Protein: 7 g/dL (ref 6.0–8.5)

## 2018-08-22 LAB — CYTOLOGY - PAP
DIAGNOSIS: NEGATIVE
HPV: DETECTED — AB

## 2018-08-26 ENCOUNTER — Other Ambulatory Visit: Payer: Self-pay | Admitting: *Deleted

## 2018-08-26 DIAGNOSIS — R87618 Other abnormal cytological findings on specimens from cervix uteri: Secondary | ICD-10-CM

## 2018-08-26 DIAGNOSIS — R8789 Other abnormal findings in specimens from female genital organs: Secondary | ICD-10-CM

## 2018-08-28 ENCOUNTER — Telehealth: Payer: Self-pay | Admitting: Obstetrics & Gynecology

## 2018-08-28 NOTE — Telephone Encounter (Signed)
Called and left a message for patient to call back to schedule a new patient doctor referral appointment with our office Dr. 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  Eval and treat.

## 2018-08-29 ENCOUNTER — Telehealth: Payer: Self-pay | Admitting: Family Medicine

## 2018-08-29 NOTE — Telephone Encounter (Signed)
Pt returned your call  [phone]

## 2018-09-02 ENCOUNTER — Encounter: Payer: Self-pay | Admitting: Obstetrics & Gynecology

## 2018-09-09 DIAGNOSIS — Z780 Asymptomatic menopausal state: Secondary | ICD-10-CM | POA: Diagnosis not present

## 2018-09-09 DIAGNOSIS — Z1231 Encounter for screening mammogram for malignant neoplasm of breast: Secondary | ICD-10-CM | POA: Diagnosis not present

## 2018-09-09 DIAGNOSIS — M85852 Other specified disorders of bone density and structure, left thigh: Secondary | ICD-10-CM | POA: Diagnosis not present

## 2018-09-09 DIAGNOSIS — Z8262 Family history of osteoporosis: Secondary | ICD-10-CM | POA: Diagnosis not present

## 2018-09-09 LAB — HM DEXA SCAN: HM Dexa Scan: NORMAL

## 2018-09-09 LAB — HM MAMMOGRAPHY

## 2018-09-16 ENCOUNTER — Encounter: Payer: Self-pay | Admitting: *Deleted

## 2018-10-14 ENCOUNTER — Other Ambulatory Visit: Payer: Self-pay | Admitting: Family Medicine

## 2018-10-14 DIAGNOSIS — G250 Essential tremor: Secondary | ICD-10-CM

## 2018-10-14 NOTE — Telephone Encounter (Signed)
Is this okay to refill? 

## 2018-10-21 ENCOUNTER — Encounter: Payer: Self-pay | Admitting: Family Medicine

## 2018-10-26 ENCOUNTER — Other Ambulatory Visit: Payer: Self-pay | Admitting: Family Medicine

## 2018-10-26 DIAGNOSIS — G250 Essential tremor: Secondary | ICD-10-CM

## 2018-10-28 ENCOUNTER — Other Ambulatory Visit: Payer: Self-pay | Admitting: Family Medicine

## 2018-10-28 DIAGNOSIS — F419 Anxiety disorder, unspecified: Secondary | ICD-10-CM

## 2018-10-28 DIAGNOSIS — F325 Major depressive disorder, single episode, in full remission: Secondary | ICD-10-CM

## 2018-10-28 DIAGNOSIS — E782 Mixed hyperlipidemia: Secondary | ICD-10-CM

## 2018-10-29 ENCOUNTER — Encounter: Payer: Self-pay | Admitting: Family Medicine

## 2018-11-08 ENCOUNTER — Ambulatory Visit (INDEPENDENT_AMBULATORY_CARE_PROVIDER_SITE_OTHER): Payer: Medicare HMO | Admitting: Obstetrics & Gynecology

## 2018-11-08 ENCOUNTER — Other Ambulatory Visit (HOSPITAL_COMMUNITY)
Admission: RE | Admit: 2018-11-08 | Discharge: 2018-11-08 | Disposition: A | Discharge status: Home / Self Care | Payer: Medicare HMO | Source: Ambulatory Visit | Attending: Obstetrics & Gynecology | Admitting: Obstetrics & Gynecology

## 2018-11-08 ENCOUNTER — Other Ambulatory Visit: Payer: Self-pay

## 2018-11-08 ENCOUNTER — Encounter: Payer: Self-pay | Admitting: Obstetrics & Gynecology

## 2018-11-08 VITALS — BP 102/76 | HR 64 | Resp 16 | Ht 63.25 in | Wt 143.6 lb

## 2018-11-08 DIAGNOSIS — R87810 Cervical high risk human papillomavirus (HPV) DNA test positive: Secondary | ICD-10-CM | POA: Diagnosis present

## 2018-11-08 DIAGNOSIS — R69 Illness, unspecified: Secondary | ICD-10-CM | POA: Diagnosis not present

## 2018-11-08 NOTE — Progress Notes (Signed)
73 y.o. [id number] Divorced White or Caucasian female here for new patient appointment.  She is referred from Dr. Joselyn ArrowEve Knapp for additional evaluation of +HR HPV status.  Pt has hx of normal pap smears with +HR HPV testing in 2015, 2018 and 2019.  She did have abnormal pap smears in the past but this was remote.  Denies any vaginal bleeding or discharge.  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    Pt has many questions today about her positive HR HPV testing.  These were all answered today.  Patient's last menstrual period was 03/17/1999 (approximate).          Sexually active: No.  The current method of family planning is abstinence and post menopausal status.    Exercising: Yes.    walk Smoker:  former  Health Maintenance: Pap:  08/19/18 Neg. HR HPV:+detected   08/16/17 Neg. HR HPV:+detected  05/27/14 Neg. HR HPV:+detected  History of abnormal Pap:  yes MMG:  09/09/18 BIRADS1:Neg  Colonoscopy:  05/2008 normal  BMD:   09/09/18 osteopenia  TDaP:  2011 Pneumonia vaccine(s):  2017  Shingrix:   No Hep C testing: 06/16/15 neg  Screening Labs: PCP   reports that she has quit smoking. Her smoking use included cigarettes. She has a 7.50 pack-year smoking history. She has never used smokeless tobacco. She reports current alcohol use. She reports that she does not use drugs.  Past Medical History:  Diagnosis Date  . Abnormal Pap smear of cervix 2019   HR HPV + recurrent   . Depression   . Elevated C-reactive protein (CRP) 01/2005  . Elevated cholesterol 01/2005  . Elevated triglycerides with high cholesterol 01/2005  . GERD (gastroesophageal reflux disease)   . History of endometriosis   . Migraine   . Tremor, essential     Past Surgical History:  Procedure Laterality Date  . ABDOMINAL ADHESION SURGERY  1981  . APPENDECTOMY  1977  . CATARACT EXTRACTION Left 01/02/2018   Dr. Dagoberto LigasStoneburner  . COLONOSCOPY  05/26/2008  . LAPAROSCOPY  1981   endometriosis  . plastic surgery-neck  2008  . r  breast lump  1992   benign  . TONSILECTOMY, ADENOIDECTOMY, BILATERAL MYRINGOTOMY AND TUBES  1953   age 89  . UTERINE FIBROID SURGERY  1977    Current Outpatient Medications  Medication Sig Dispense Refill  . aspirin 81 MG tablet Take 81 mg by mouth daily.    . Calcium Carbonate-Vitamin D (CALCIUM + D) 600-200 MG-UNIT TABS Take 600 mg by mouth 2 (two) times daily.     . clonazePAM (KLONOPIN) 1 MG tablet TAKE 1 TABLET BY MOUTH TWICE A DAY 180 tablet 0  . diphenhydramine-acetaminophen (TYLENOL PM) 25-500 MG TABS tablet Take 2 tablets by mouth at bedtime as needed.    Marland Kitchen. omeprazole (PRILOSEC OTC) 20 MG tablet Take 20 mg by mouth daily.    . propranolol ER (INDERAL LA) 80 MG 24 hr capsule TAKE 1 CAPSULE(80 MG) BY MOUTH DAILY 90 capsule 1  . rosuvastatin (CRESTOR) 40 MG tablet TAKE 1 TABLET(40 MG) BY MOUTH DAILY 90 tablet 1  . sertraline (ZOLOFT) 50 MG tablet TAKE 1 TABLET BY MOUTH EVERY DAY 90 tablet 1   No current facility-administered medications for this visit.     Family History  Problem Relation Age of Onset  . Diabetes Mother   . Cancer Father        throat  . Heart disease Father [id number]  . Stroke Father 855  .  Depression Brother   . Cancer Maternal Aunt        mult aunts with CA--uterine, stomach  . Depression Maternal Uncle        suicide  . Alzheimer's disease Cousin     Review of Systems  All other systems reviewed and are negative.   Exam:   BP 102/76 (BP Location: Left Arm, Patient Position: Sitting, Cuff Size: Large)   Pulse 64   Resp 16   Ht 5' 3.25" (1.607 m)   Wt 143 lb 9.6 oz (65.1 kg)   LMP 03/17/1999 (Approximate)   BMI 25.24 kg/m     Height: 5' 3.25" (160.7 cm)  Ht Readings from Last 3 Encounters:  11/08/18 5' 3.25" (1.607 m)  08/19/18 5\' 4"  (1.626 m)  02/14/18 5' 3.5" (1.613 m)   General appearance: alert, cooperative and appears stated age Lymph nodes:  No abnormal inguinal nodes palpated  Pelvic: External genitalia:  no lesions               Urethra:  normal appearing urethra with no masses, tenderness or lesions              Bartholins and Skenes: normal                 Vagina: normal appearing vagina with normal color and discharge, no lesions              Cervix: no lesions              Pap taken: 16/18/45 testing obtained today Bimanual Exam:  Uterus:  normal size, contour, position, consistency, mobility, non-tender              Adnexa: normal adnexa and no mass, fullness, tenderness  Chaperone was present for exam.  A:  +HR HPV with negative pap smears noted over the past 4+ years Former smoker  P:   HR HPV 16/18/45 testing will be obtained today.  If this is positive, she will return for colposcopy with possible biopsies and ECC. If this is negative, she does need to continue ot have yearly pap smears with HR HPV until the pap smear changes and/or there is resolution of the high risk HPV testing.  About 30 minutes spent with pt with >50% of this being in face to face discussion of her history, HPV, cervical cancer risks, additional evaluation and treatment options.

## 2018-11-12 LAB — CERVICOVAGINAL ANCILLARY ONLY: HPV 16/18/45 genotyping: NEGATIVE

## 2018-11-14 ENCOUNTER — Telehealth: Payer: Self-pay | Admitting: Emergency Medicine

## 2018-11-14 NOTE — Telephone Encounter (Signed)
Patient returned call

## 2018-11-14 NOTE — Telephone Encounter (Signed)
08 Recall entered.  Message left to return call to [place] at [phone].    Please schedule office visit in 12 months for PAP with cotesting.

## 2018-11-14 NOTE — Telephone Encounter (Signed)
Left message to call Isla Sabree, RN at [facility] [phone].   

## 2018-11-14 NOTE — Telephone Encounter (Signed)
Spoke with patient, advised as seen below per Dr. Hyacinth MeekerMiller. Patient declines to schedule OV at this time, states she will return call to office to schedule. Recall placed. Patient verbalizes understanding and is agreeable.   Encounter closed.

## 2018-11-14 NOTE — Telephone Encounter (Signed)
-----   Message from Jerene Bears, MD sent at 11/13/2018 11:55 AM EST ----- Please let pt know her 16/18/45 testing was negative.  She needs a pap and HR HPV again in one year.  Is just coming to me for this so just needs problem visit for pap in one year.  08 recall.

## 2019-01-02 ENCOUNTER — Other Ambulatory Visit: Payer: Self-pay | Admitting: Family Medicine

## 2019-01-02 DIAGNOSIS — G250 Essential tremor: Secondary | ICD-10-CM

## 2019-01-02 NOTE — Telephone Encounter (Signed)
I did call patient and she has exactly two weeks left -she said she put in for this refill because she wants to make sure she has enough and can this rx in case she can't get it in two weeks.

## 2019-03-03 ENCOUNTER — Encounter: Payer: Medicare HMO | Admitting: Family Medicine

## 2019-03-05 NOTE — Progress Notes (Signed)
Start time: 8:41 End time: 9:10  Virtual Visit via Video Note  I connected with Lindsey Frank on 03/06/2019 by a video enabled telemedicine application and verified that I am speaking with the correct person using two identifiers.  Location: Patient: at home, alone Provider: home office   I discussed the limitations of evaluation and management by telemedicine and the availability of in person appointments. The patient expressed understanding and agreed to proceed. She also agrees to this visit being billed to her insurance.  History of Present Illness:  Chief Complaint  Patient presents with  . Hyperlipidemia    VIRTUAL med check. Patient would like to go to hairdresser next month and would like to know if it is safe.    Impaired fasting glucose: she continues to limit her sweets (small portions of chocolate daily), avoids other sweets.  Limits rice, pasta, uses whole wheat bread. Doesn't eat a lot of starchy vegetables. She walks almost amile a day,6days/week, including a hill (20 minutes). She has an elliptical at home for bad weather days. Lab Results  Component Value Date   HGBA1C 5.7 (A) 08/19/2018    Mixed hyperlipidemia: She reports being compliant with herCrestor. Patient is reportedly following a low-fat, low cholesterol diet. Compliant with medications and denies medication side effects. Doesn't take fish oil, due to causing bad breath.  Lipids were at goal on her last check. Lab Results  Component Value Date   CHOL 160 08/19/2018   HDL 64 08/19/2018   LDLCALC 70 08/19/2018   TRIG 132 08/19/2018   CHOLHDL 2.5 08/19/2018   Tremor--Tolerating propranolol without any side effects, along with the clonazepam (previously took nadolol, changed due to cost.) She does still have some persistent tremor--notices it is a little harder to text on her phone, but not bothersome or even really noticeable to the patient. Has been stable on this medication regimen. (She  previously tried mysoline, but didn't tolerate it ("sick all day after one pill").  GERD--She takes Prilosec OTC daily, and denies symptoms.She has recurrent heartburn if she misses a pill (relieved by standing up and drinking a large glass of water.) Denies dysphagia.  Depression--she stopped Sertraline a few years ago, didn't haveany recurrent depression afterstopping (has had recurrent depression when stopped in the past, related to work stress), but she had problems with insomnia. OTC meds didn't help. She didn't tolerate trazadonedue to nausea.  Sertraline was restarted at that time, at 76m, and has been doing well. Sleep is improved, denies depression.  There is a strong family h/o suicide in her family (5 people).  "I'm pretty depressed right now"--being home alone, [place] Pref[place] to stay on current dose.  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 She saw Dr. MSabra Heck and is due to see her again for repeat pap in 10/2019 (1 year).   PMH, PSH, SH reviewed  Outpatient Encounter Medications as of 03/06/2019  Medication Sig Note  . aspirin 81 MG tablet Take 81 mg by mouth daily.   . Calcium Carbonate-Vitamin D (CALCIUM + D) 600-200 MG-UNIT TABS Take 600 mg by mouth 2 (two) times daily.    .Derrill MemoON 04/03/2019] clonazePAM (KLONOPIN) 1 MG tablet Take 1 tablet (1 mg total) by mouth 2 (two) times daily.   .Marland Kitchenomeprazole (PRILOSEC OTC) 20 MG tablet Take 20 mg by mouth daily.   . propranolol ER (INDERAL LA) 80 MG 24 hr capsule TAKE 1 CAPSULE(80 MG) BY MOUTH DAILY   .  rosuvastatin (CRESTOR) 40 MG tablet TAKE 1 TABLET(40 MG) BY MOUTH DAILY   . sertraline (ZOLOFT) 50 MG tablet TAKE 1 TABLET BY MOUTH EVERY DAY   . [DISCONTINUED] clonazePAM (KLONOPIN) 1 MG tablet TAKE 1 TABLET BY MOUTH TWICE A DAY   . diphenhydramine-acetaminophen (TYLENOL PM) 25-500 MG TABS tablet Take 2 tablets by mouth at bedtime as needed. 08/16/2017: Uses prn, usually if has a headache  when going to bed   No facility-administered encounter medications on file as of 03/06/2019.    Allergies  Allergen Reactions  . Trazodone And Nefazodone Nausea Only  . Mysoline [Primidone] Nausea Only    Nausea and achey    ROS: no fever, chills, headaches, dizziness, chest pain, palpitations, shortness of breath, URI or allergy symptoms.  Mild depression/loneliness per HPI.  No other complaints, see HPI.    Observations/Objective:  Ht 5' 3.25" (1.607 m)   Wt 142 lb 4.8 oz (64.5 kg)   LMP 03/17/1999 (Approximate)   BMI 25.01 kg/m    Wt Readings from Last 3 Encounters:  11/08/18 143 lb 9.6 oz (65.1 kg)  08/19/18 142 lb 3.2 oz (64.5 kg)  02/14/18 144 lb 3.2 oz (65.4 kg)   Exam is limited due to virtual nature of the visit.   Assessment and Plan:  Mixed hyperlipidemia - at goal per last check.  Reminded to continue lowfat, low cholesterol diet, continue crestor - Plan: Lipid panel  Impaired fasting glucose - continue to limit sweets and carbs, daily exercise.  Will recheck A1c at her physical in November - Plan: Comprehensive metabolic panel, Hemoglobin A1c  Benign essential tremor - stable on current regimen, continue - Plan: clonazePAM (KLONOPIN) 1 MG tablet  Gastroesophageal reflux disease, esophagitis presence not specified - controlled, requires daily prilosec OTC  Depression, major, in remission ([place]) - slight worsening of moods related to COVID-19 pandemic.  counseled. Declines change in medication at this time  Insomnia, unspecified type - doing well on sertraline  Medication monitoring encounter - Plan: CBC with Differential/Platelet, Comprehensive metabolic panel, Lipid panel   Discussed social distancing and safety measures.  All questions answered.  F/u in November as scheduled. Will come for labs prior. CBC, c-met, lipids, A1c     Follow Up Instructions:    I discussed the assessment and treatment plan with the patient. The patient was provided an  opportunity to ask questions and all were answered. The patient agreed with the plan and demonstrated an understanding of the instructions.   The patient was advised to call back or seek an in-person evaluation if the symptoms worsen or if the condition fails to improve as anticipated.  I provided 29 minutes of non-face-to-face time during this encounter. More than 1/2 was spent counseling.  Vikki Ports, MD

## 2019-03-06 ENCOUNTER — Ambulatory Visit (INDEPENDENT_AMBULATORY_CARE_PROVIDER_SITE_OTHER): Payer: Medicare HMO | Admitting: Family Medicine

## 2019-03-06 ENCOUNTER — Encounter: Payer: Self-pay | Admitting: Family Medicine

## 2019-03-06 ENCOUNTER — Other Ambulatory Visit: Payer: Self-pay

## 2019-03-06 VITALS — Ht 63.25 in | Wt 142.3 lb

## 2019-03-06 DIAGNOSIS — Z5181 Encounter for therapeutic drug level monitoring: Secondary | ICD-10-CM

## 2019-03-06 DIAGNOSIS — K219 Gastro-esophageal reflux disease without esophagitis: Secondary | ICD-10-CM | POA: Diagnosis not present

## 2019-03-06 DIAGNOSIS — E782 Mixed hyperlipidemia: Secondary | ICD-10-CM

## 2019-03-06 DIAGNOSIS — G250 Essential tremor: Secondary | ICD-10-CM

## 2019-03-06 DIAGNOSIS — F325 Major depressive disorder, single episode, in full remission: Secondary | ICD-10-CM

## 2019-03-06 DIAGNOSIS — R7301 Impaired fasting glucose: Secondary | ICD-10-CM

## 2019-03-06 DIAGNOSIS — G4700 Insomnia, unspecified: Secondary | ICD-10-CM | POA: Diagnosis not present

## 2019-03-06 DIAGNOSIS — R69 Illness, unspecified: Secondary | ICD-10-CM | POA: Diagnosis not present

## 2019-03-06 MED ORDER — CLONAZEPAM 1 MG PO TABS: 1.0000 mg | ORAL_TABLET | Freq: Two times a day (BID) | ORAL | 0 refills | Status: DC

## 2019-03-06 NOTE — Patient Instructions (Addendum)
Continue your current medications.  Call your pharmacy when the other refills are needed (should be in July). Continue to limit sweets and carbs, and continue to get daily exercise.  Consider using your elliptical on rainy days such as today.  If you find that your moods are worsening, consider counseling, or we an bump up the sertraline dose temporarily until life is a little more normal.  In the meantime, try and have some structure in your day, and stay connected to others through phone calls, video chats, etc.  If you need anything, contact us.  Stay well!

## 2019-03-06 NOTE — Progress Notes (Signed)
done

## 2019-04-18 ENCOUNTER — Other Ambulatory Visit: Payer: Self-pay | Admitting: Family Medicine

## 2019-04-18 DIAGNOSIS — E782 Mixed hyperlipidemia: Secondary | ICD-10-CM

## 2019-04-18 DIAGNOSIS — F325 Major depressive disorder, single episode, in full remission: Secondary | ICD-10-CM

## 2019-04-18 DIAGNOSIS — F419 Anxiety disorder, unspecified: Secondary | ICD-10-CM

## 2019-04-20 ENCOUNTER — Other Ambulatory Visit: Payer: Self-pay | Admitting: Family Medicine

## 2019-04-20 DIAGNOSIS — G250 Essential tremor: Secondary | ICD-10-CM

## 2019-07-07 ENCOUNTER — Other Ambulatory Visit: Payer: Self-pay | Admitting: Family Medicine

## 2019-07-07 DIAGNOSIS — G250 Essential tremor: Secondary | ICD-10-CM

## 2019-07-07 NOTE — Telephone Encounter (Signed)
Is this okay to refill? 

## 2019-08-21 ENCOUNTER — Other Ambulatory Visit: Payer: Medicare HMO

## 2019-08-21 DIAGNOSIS — E782 Mixed hyperlipidemia: Secondary | ICD-10-CM

## 2019-08-21 DIAGNOSIS — Z5181 Encounter for therapeutic drug level monitoring: Secondary | ICD-10-CM | POA: Diagnosis not present

## 2019-08-21 DIAGNOSIS — R7301 Impaired fasting glucose: Secondary | ICD-10-CM

## 2019-08-22 LAB — CBC WITH DIFFERENTIAL/PLATELET
Basophils Absolute: 0.1 10*3/uL (ref 0.0–0.2)
Basos: 1 %
EOS (ABSOLUTE): 0.2 10*3/uL (ref 0.0–0.4)
Eos: 3 %
Hematocrit: 41.4 % (ref 34.0–46.6)
Hemoglobin: 13.5 g/dL (ref 11.1–15.9)
Immature Grans (Abs): 0 10*3/uL (ref 0.0–0.1)
Immature Granulocytes: 0 %
Lymphocytes Absolute: 2.9 10*3/uL (ref 0.7–3.1)
Lymphs: 42 %
MCH: 29.2 pg (ref 26.6–33.0)
MCHC: 32.6 g/dL (ref 31.5–35.7)
MCV: 89 fL (ref 79–97)
Monocytes Absolute: 0.6 10*3/uL (ref 0.1–0.9)
Monocytes: 8 %
Neutrophils Absolute: 3.1 10*3/uL (ref 1.4–7.0)
Neutrophils: 46 %
Platelets: 231 10*3/uL (ref 150–450)
RBC: 4.63 x10E6/uL (ref 3.77–5.28)
RDW: 13.4 % (ref 11.7–15.4)
WBC: 6.9 10*3/uL (ref 3.4–10.8)

## 2019-08-22 LAB — COMPREHENSIVE METABOLIC PANEL
ALT: 11 IU/L (ref 0–32)
AST: 19 IU/L (ref 0–40)
Albumin/Globulin Ratio: 2.7 — ABNORMAL HIGH (ref 1.2–2.2)
Albumin: 4.9 g/dL — ABNORMAL HIGH (ref 3.7–4.7)
Alkaline Phosphatase: 60 IU/L (ref 39–117)
BUN/Creatinine Ratio: 16 (ref 12–28)
BUN: 16 mg/dL (ref 8–27)
Bilirubin Total: 0.4 mg/dL (ref 0.0–1.2)
CO2: 25 mmol/L (ref 20–29)
Calcium: 9.7 mg/dL (ref 8.7–10.3)
Chloride: 104 mmol/L (ref 96–106)
Creatinine, Ser: 1.02 mg/dL — ABNORMAL HIGH (ref 0.57–1.00)
GFR calc Af Amer: 64 mL/min/{1.73_m2} (ref >59)
GFR calc non Af Amer: 55 mL/min/{1.73_m2} — ABNORMAL LOW (ref >59)
Globulin, Total: 1.8 g/dL (ref 1.5–4.5)
Glucose: 94 mg/dL (ref 65–99)
Potassium: 4.7 mmol/L (ref 3.5–5.2)
Sodium: 142 mmol/L (ref 134–144)
Total Protein: 6.7 g/dL (ref 6.0–8.5)

## 2019-08-22 LAB — LIPID PANEL
Chol/HDL Ratio: 2.4 ratio (ref 0.0–4.4)
Cholesterol, Total: 153 mg/dL (ref 100–199)
HDL: 63 mg/dL (ref >39)
LDL Chol Calc (NIH): 64 mg/dL (ref 0–99)
Triglycerides: 152 mg/dL — ABNORMAL HIGH (ref 0–149)
VLDL Cholesterol Cal: 26 mg/dL (ref 5–40)

## 2019-08-22 LAB — HEMOGLOBIN A1C
Est. average glucose Bld gHb Est-mCnc: 128 mg/dL
Hgb A1c MFr Bld: 6.1 % — ABNORMAL HIGH (ref 4.8–5.6)

## 2019-08-24 NOTE — Patient Instructions (Addendum)
HEALTH MAINTENANCE RECOMMENDATIONS:  It is recommended that you get at least 30 minutes of aerobic exercise at least 5 days/week (for weight loss, you may need as much as 60-90 minutes). This can be any activity that gets your heart rate up. This can be divided in 10-15 minute intervals if needed, but try and build up your endurance at least once a week.  Weight bearing exercise is also recommended twice weekly.  Eat a healthy diet with lots of vegetables, fruits and fiber.  "Colorful" foods have a lot of vitamins (ie green vegetables, tomatoes, red peppers, etc).  Limit sweet tea, regular sodas and alcoholic beverages, all of which has a lot of calories and sugar.  Up to 1 alcoholic drink daily may be beneficial for women (unless trying to lose weight, watch sugars).  Drink a lot of water.  Calcium recommendations are 1200-1500 mg daily (1500 mg for postmenopausal women or women without ovaries), and vitamin D 1000 IU daily.  This should be obtained from diet and/or supplements (vitamins), and calcium should not be taken all at once, but in divided doses.  Monthly self breast exams and yearly mammograms for women over the age of 33 is recommended.  Sunscreen of at least SPF 30 should be used on all sun-exposed parts of the skin when outside between the hours of 10 am and 4 pm (not just when at beach or pool, but even with exercise, golf, tennis, and yard work!)  Use a sunscreen that says "broad spectrum" so it covers both UVA and UVB rays, and make sure to reapply every 1-2 hours.  Remember to change the batteries in your smoke detectors when changing your clock times in the spring and fall. Carbon monoxide detectors are recommended for your home.  Use your seat belt every time you are in a car, and please drive safely and not be distracted with cell phones and texting while driving.   [name] , Thank you for taking time to come for your Medicare Wellness Visit. I appreciate your ongoing  commitment to your health goals. Please review the following plan we discussed and let me know if I can assist you in the future.   This is a list of the screening recommended for you and due dates:  Health Maintenance  Topic Date Due  . Colon Cancer Screening  05/26/2018  . Flu Shot  05/17/2019  . Tetanus Vaccine  04/17/2020  . Mammogram  09/10/2019  . DEXA scan (bone density measurement)  Completed  .  Hepatitis C: One time screening is recommended by Center for Disease Control  (CDC) for  adults born from [birth year] through 1964.   Completed  . Pneumonia vaccines  Completed   You were given your yearly flu shot today. Please schedule your mammogram.   I recommend getting the new shingles vaccine (Shingrix). You will need to get this from the pharmacy rather than our office, as it is covered by Medicare Part D.  It is a series of 2 injections, spaced 2 months apart.  You are past due for colon cancer screening. We will put in another referral for Cologuard testing.  Please be sure to return the sample.  If the cost of the Propranolol is an issue, it may be because it is the extended release type.  We could try changing it to the regular medication, dosed three times daily (you may want to check to see if this is less expensive before we change the prescription).  Try using a stylus when trying to type on your phone, to see if that makes it easier than just using your finger.  If your allergies worsen, consider taking claritin or allegra or zyrtec if needed.

## 2019-08-24 NOTE — Progress Notes (Signed)
Chief Complaint  Patient presents with  . Medicare Wellness    nonfasting AWV/CPE no pap-sees Dr.Miller.JOSHLYN BEADLEns. Has some questions about COVID testing and travel.     Lindsey Frank is a 73 y.o. female who presents for annual physical exam, Medicare wellness visit and follow-up on chronic medical conditions.  She had labs done prior to today's visit, see below. She has the following concerns: She is flying to [place] for Christmas to see her family (got first class ticket at front of plane).  Family plans to test prior to her coming.  They are not at high risk (just the patient). She is excited to finally get to see her family.  She has been very careful (and somewhat lonely) at home.  She has redone her garden, which gives her joy.  Impaired fasting glucose: she continues to try and limit her sweets (small portions of chocolate daily), avoids other sweets. She admits that her chocolate intake has gone up since the pandemic.  Limits rice, pasta, uses whole wheat bread. Doesn't eat a lot of starchy vegetables.Shewalksalmost amile a day,3-4days/week, including a hill (20 minutes).Elliptical is broken.  Mixed hyperlipidemia: She reports being compliant with herCrestor. Patient is reportedly following a low-fat, low cholesterol diet. Compliant with medications and denies medication side effects.Doesn't take fish oil, due to causing bad breath. Lipids were at goal on her last check. Had labs done prior to visit, see below.  Tremor--Tolerating propranolol without any side effects, along with the clonazepam (previously took nadolol, changed due to cost).  She is reporting that the propranolol is expensive also. She does still have some persistent tremor--notices it is a little harder to text on her phone, but not bothersome or even really noticeable to the patient otherwise. No problems eating, or with handwriting. Has been stable on this medication regimen. (She previously tried  mysoline, but didn't tolerate it ("sick all day after one pill").  GERD--She takes Prilosec OTC daily, and denies symptoms.She hasrecurrent heartburn if she misses a pill, which hasn't happened in a while (relieved by standing up and drinking a large glass of water.)Denies dysphagia.  Depression--she stopped Sertralinea few years ago, didn't haveany recurrent depression afterstopping(has had recurrent depression when stopped in the past, related to work stress),but she had problems with insomnia. OTC meds didn't help. She didn't tolerate trazadonedue to nausea.  Sertraline was restarted at that time, at , and has been doing well on this ever since, with no further sleep issues.  There is a strong family h/o suicide in her family (5 people). At her virtual visit in May she reported feeling more depressed, related to the pandemic, being home alone, unable to visit family in [place].  She declined increase in sertraline dose at that time.   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 She saw Dr. Hyacinth Meeker, and is due to see her again for repeat pap in 10/2019 (1 year).   Immunization History  Administered Date(s) Administered  . Influenza Split 08/16/2012, 07/28/2014  . Influenza, High Dose Seasonal PF 06/16/2015, 07/05/2016, 08/16/2017, 08/19/2018  . Pneumococcal Conjugate-13 05/27/2014  . Pneumococcal Polysaccharide-23 04/17/2010, 07/05/2016  . Td 10/16/2000  . Tdap 04/17/2010  . Zoster 03/03/2007   Last Pap smear:  08/2018, normal (atrophy), +HR HPV; tested negative for 16, 18/45 in 10/2018 by Dr. Hyacinth Meeker.  Last mammogram: 08/2018 Last colonoscopy: 8/09; referred for Cologuard last year, never done. Last DEXA:  08/2018, T-1.1 Left fem neck Exercise: walks20 minutes 3-4  days/week.No weight-bearing exercise. Goes to dentist every 6 months (past due, due to COVID) Ophtho: regularly  Other doctors caring for patient include: GYN (Dr. Hyacinth Meeker) Dentist (Dr.Bartlett)   Ophtho (Dr. Dagoberto Ligas)  GI (Dr. Madilyn Fireman at [facility], 2009)  Neuro (Dr. Frances Furbish), no longer sees.  Fall screen: None Depression screen: negative Functional Status Survey:  Unremarkable. Denies memory concerns. Mini-Cog screen:  Normal  End of Life Discussion: Patient hasa living will and medical power of attorney.   Past Medical History:  Diagnosis Date  . Abnormal Pap smear of cervix 2019   HR HPV + recurrent   . Depression   . Elevated C-reactive protein (CRP) 01/2005  . Elevated cholesterol 01/2005  . Elevated triglycerides with high cholesterol 01/2005  . GERD (gastroesophageal reflux disease)   . History of endometriosis   . Migraine   . Tremor, essential     Past Surgical History:  Procedure Laterality Date  . BREAST LUMPECTOMY Right 1992   small lumpectomy for benign tumor  . CATARACT EXTRACTION Left 01/02/2018   Dr. Dagoberto Ligas  . COSMETIC SURGERY  2008   neck lift  . LAPAROSCOPY  1981   endometriosis  . TONSILECTOMY, ADENOIDECTOMY, BILATERAL MYRINGOTOMY AND TUBES  1953   age 24  . UTERINE FIBROID SURGERY  1977   with appendectomy    Social History   Socioeconomic History  . Marital status: Divorced    Spouse name: Not on file  . Number of children: 1  . Years of education: college  . Highest education level: Not on file  Occupational History  . 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 Financial resource strain: Not on file  . Food insecurity    Worry: Not on file    Inability: Not on file  . Transportation needs    Medical: Not on file    Non-medical: Not on file  Tobacco Use  . Smoking status: Former Smoker    Packs/day: 0.25    Years: 30.00    Pack years: 7.50    Types: Cigarettes  . Smokeless tobacco: Never Used  . Tobacco comment: quit 12/2014, relapsed with stress, quit again end of July 2016  Substance and Sexual Activity  . Alcohol use: Yes    Alcohol/week: 0.0 - 1.0 standard drinks     Comment: (occasional Tia Maria), occasionally on weekends  . Drug use: No  . Sexual activity: Not Currently  Lifestyle  . Physical activity    Days per week: Not on file    Minutes per session: Not on file  . Stress: Not on file  Relationships  . Social Musician on phone: Not on file    Gets together: Not on file    Attends religious service: Not on file    Active member of club or organization: Not on file    Attends meetings of clubs or organizations: Not on file    Relationship status: Not on file  . Intimate partner violence    Fear of current or ex partner: Not on file    Emotionally abused: Not on file    Physically abused: Not on file    Forced sexual activity: Not on file  Other Topics Concern  . Not on file  Social History Narrative   Lives alone.  Son lives in [place], 4 grandchildren   College Education   Right handed   Caffeine one cup daily (tea with milk).  Family History  Problem Relation Age of Onset  . Diabetes Mother   . Heart disease Father 45  . Stroke Father 76  . Throat cancer Father   . Depression Brother   . Stomach cancer Maternal Aunt   . Depression Maternal Uncle        suicide  . Alzheimer's disease Cousin     Outpatient Encounter Medications as of 08/25/2019  Medication Sig Note  . aspirin 81 MG tablet Take 81 mg by mouth daily.   . Calcium Carbonate-Vitamin D (CALCIUM + D) 600-200 MG-UNIT TABS Take 600 mg by mouth 2 (two) times daily.    . clonazePAM (KLONOPIN) 1 MG tablet TAKE 1 TABLET BY MOUTH TWICE A DAY   . omeprazole (PRILOSEC OTC) 20 MG tablet Take 20 mg by mouth daily.   . propranolol ER (INDERAL LA) 80 MG 24 hr capsule TAKE 1 CAPSULE(80 MG) BY MOUTH DAILY   . rosuvastatin (CRESTOR) 40 MG tablet TAKE 1 TABLET(40 MG) BY MOUTH DAILY   . sertraline (ZOLOFT) 50 MG tablet TAKE 1 TABLET BY MOUTH EVERY DAY   . diphenhydramine-acetaminophen (TYLENOL PM) 25-500 MG TABS tablet Take 2 tablets by mouth at bedtime as needed.  08/16/2017: Uses prn, usually if has a headache when going to bed   No facility-administered encounter medications on file as of 08/25/2019.     Allergies  Allergen Reactions  . Trazodone And Nefazodone Nausea Only  . Mysoline [Primidone] Nausea Only    Nausea and achey    ROS: The patient denies anorexia, fever,vision changes, decreased hearing, ear pain, sore throat, breast concerns, chest pain, palpitations, dizziness, syncope, dyspnea on exertion, cough, swelling, nausea, vomiting, diarrhea, constipation, abdominal pain, melena, hematochezia, indigestion/heartburn, hematuria, incontinence, dysuria, vaginal bleeding, discharge, odor or itch, genital lesions, joint pains, numbness, tingling, weakness, depression, anxiety, abnormal bleeding/bruising, or enlarged lymph nodes. Some postnasal drainage, chronic (since childhood--doesn't desire treatment). Tremor per HPI, stable.  Insomnia is well controlled. New mattress helps, sleep improved since getting. Slight weight gain noted   PHYSICAL EXAM:  BP 118/60   Pulse 64   Temp 98.6 F (37 C) (Other (Comment))   Ht 5' 3.5" (1.613 m)   Wt 147 lb (66.7 kg)   LMP 03/17/1999 (Approximate)   BMI 25.63 kg/m   Wt Readings from Last 3 Encounters:  08/25/19 147 lb (66.7 kg)  03/06/19 142 lb 4.8 oz (64.5 kg)  11/08/18 143 lb 9.6 oz (65.1 kg)     General Appearance:  Alert, cooperative, no distress, appears stated age.She is sitting with very poor posture, appears somewhat kyphotic (but can straighten fairly well when asked), with neck flexed. When seated on table, she would start to gradually lean back some, would straighten herself up, only to have her gradually lean back again. She did not fall backwards to supine position as she had done last year (when looking up); able to correct herself/posture today.  Head:  Normocephalic, without obvious abnormality, atraumatic.  Eyes:  PERRL, conjunctiva/corneas clear, EOM's intact, fundi benign    Ears:  Normal TM's and external ear canals   Nose:  Not examined, wearing mask due to COVID-19 pandemic  Throat:  Not examined, wearing mask due to COVID-19 pandemic  Neck:  Supple, no lymphadenopathy; thyroid: no enlargement/tenderness/ nodules; no carotid bruit or JVD   Back:  Spine nontender, no CVA tenderness   Lungs:  Clear to auscultation bilaterally without wheezes, rales or ronchi; respirations unlabored   Chest Wall:  No tenderness or deformity  Heart:  Regular rate and rhythm, S1 and S2 normal, no murmur, rub or gallop   Breast Exam:  No tenderness, masses, or nipple discharge or inversion. No axillary lymphadenopathy   Abdomen:  Soft, non-tender, nondistended, normoactive bowel sounds, no masses, no hepatosplenomegaly   Genitalia:  Deferred to GYN   Rectal:  Deferred (patient declined to undress from waist down today)  Extremities:  No clubbing, cyanosis or edema   Pulses:  2+ and symmetric all extremities   Skin:  Skin color, texture, turgor normal, no rashes or lesions.  Lymph nodes:  Cervical, supraclavicular, and axillary nodes normal   Neurologic:  Normal strength, sensation; reflexes are hyperreflexic (3+) and symmetric throughout.Negative Romberg.  She had trouble with tandem gait. Mild tremor of hands noted, with intention (trying to use index finger to do code on her phone).  Minimal head-bobbing also noted.       Psych: Normal mood, affect, hygiene and grooming  PHQ-9 score of zero  Labs done prior to visit: Lab Results  Component Value Date   WBC 6.9 08/21/2019   HGB 13.5 08/21/2019   HCT 41.4 08/21/2019   MCV 89 08/21/2019   PLT 231 08/21/2019     Chemistry      Component Value Date/Time   NA 142 08/21/2019 0831   K 4.7 08/21/2019 0831   CL 104 08/21/2019 0831   CO2 25 08/21/2019 0831   BUN 16 08/21/2019 0831   CREATININE 1.02 (H) 08/21/2019 0831   CREATININE 0.91 08/16/2017 1112       Component Value Date/Time   CALCIUM 9.7 08/21/2019 0831   ALKPHOS 60 08/21/2019 0831   AST 19 08/21/2019 0831   ALT 11 08/21/2019 0831   BILITOT 0.4 08/21/2019 0831     Fasting glu 94  Lab Results  Component Value Date   HGBA1C 6.1 (H) 08/21/2019   Lab Results  Component Value Date   CHOL 153 08/21/2019   HDL 63 08/21/2019   LDLCALC 64 08/21/2019   TRIG 152 (H) 08/21/2019   CHOLHDL 2.4 08/21/2019    ASSESSMENT/PLAN:  Annual physical exam  Medicare annual wellness visit, subsequent  Mixed hyperlipidemia - TG only minimally elevated, due to increased chocolate intake. Cont rosuvastatin. Cut back on chocolate.   Impaired fasting glucose - A1c went up. Discussed diet (cut back on chocolate), exercise and weight loss rec  Benign essential tremor - stable/controlled. 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) - cont sertraline 50mg   Insomnia, unspecified type - well controlled on sertraline, 50mg . Cont  Need for influenza vaccination - Plan: Flu Vaccine QUAD High Dose(Fluad)  Colon cancer screening - Never returned Cologuard last year.  Reports she is willing to do so next year. Re-refer - Plan: Cologuard  Pap smear abnormality of cervix/human papillomavirus (HPV) positive - pt to schedule f/u visit for pap with Dr. Sabra Heck in 10/2019  F/u 6 months (A1c at visit), and schedule CPE/AWV for 1 year  Discussed monthly self breast exams and yearly mammograms (due); at least 30 minutes of aerobic activity at least 5 days/week, weight bearing exercise 2x/week; proper sunscreen use reviewed; healthy diet, including goals of calcium and vitamin D intake and alcohol recommendations (less than or equal to 1 drink/day) reviewed; regular seatbelt use; changing batteries in smoke detectors. Immunization recommendations discussed--high dose flu shot given.Shingrix recommended, risks/side effects reviewed, to get  from pharmacy.Tdap will be due next year (to get from  pharmacy). Colon cancer screening recommendations reviewed, past due. Discussed Cologuard vs colonoscopy. Prefers Cologuard, and reports she will return it this year.  Counseled extensively re: exercise, core strengthening, working on balance, diet, weight loss, calcium and D intake.   Full Code, Full Care.    Medicare Attestation I have personally reviewed: The patient's medical and social history Their use of alcohol, tobacco or illicit drugs Their current medications and supplements The patient's functional ability including ADLs,fall risks, home safety risks, cognitive, and hearing and visual impairment Diet and physical activities Evidence for depression or mood disorders  The patient's weight, height, BMI have been recorded in the chart.  I have made referrals, counseling, and provided education to the patient based on review of the above and I have provided the patient with a written personalized care plan for preventive services.

## 2019-08-25 ENCOUNTER — Other Ambulatory Visit: Payer: Self-pay

## 2019-08-25 ENCOUNTER — Ambulatory Visit (INDEPENDENT_AMBULATORY_CARE_PROVIDER_SITE_OTHER): Payer: Medicare HMO | Admitting: Family Medicine

## 2019-08-25 ENCOUNTER — Encounter: Payer: Self-pay | Admitting: Family Medicine

## 2019-08-25 VITALS — BP 118/60 | HR 64 | Temp 98.6°F | Ht 63.5 in | Wt 147.0 lb

## 2019-08-25 DIAGNOSIS — Z23 Encounter for immunization: Secondary | ICD-10-CM

## 2019-08-25 DIAGNOSIS — R8789 Other abnormal findings in specimens from female genital organs: Secondary | ICD-10-CM

## 2019-08-25 DIAGNOSIS — R87618 Other abnormal cytological findings on specimens from cervix uteri: Secondary | ICD-10-CM

## 2019-08-25 DIAGNOSIS — Z0000 Encounter for general adult medical examination without abnormal findings: Secondary | ICD-10-CM

## 2019-08-25 DIAGNOSIS — R7301 Impaired fasting glucose: Secondary | ICD-10-CM

## 2019-08-25 DIAGNOSIS — Z1211 Encounter for screening for malignant neoplasm of colon: Secondary | ICD-10-CM

## 2019-08-25 DIAGNOSIS — G4700 Insomnia, unspecified: Secondary | ICD-10-CM

## 2019-08-25 DIAGNOSIS — E782 Mixed hyperlipidemia: Secondary | ICD-10-CM

## 2019-08-25 DIAGNOSIS — R69 Illness, unspecified: Secondary | ICD-10-CM | POA: Diagnosis not present

## 2019-08-25 DIAGNOSIS — G250 Essential tremor: Secondary | ICD-10-CM

## 2019-08-25 DIAGNOSIS — F325 Major depressive disorder, single episode, in full remission: Secondary | ICD-10-CM | POA: Diagnosis not present

## 2019-08-26 NOTE — Progress Notes (Signed)
Called and left message for pt

## 2019-09-16 DIAGNOSIS — R69 Illness, unspecified: Secondary | ICD-10-CM | POA: Diagnosis not present

## 2019-09-18 DIAGNOSIS — Z1231 Encounter for screening mammogram for malignant neoplasm of breast: Secondary | ICD-10-CM | POA: Diagnosis not present

## 2019-09-18 LAB — HM MAMMOGRAPHY

## 2019-10-02 ENCOUNTER — Other Ambulatory Visit: Payer: Self-pay | Admitting: Family Medicine

## 2019-10-02 DIAGNOSIS — F325 Major depressive disorder, single episode, in full remission: Secondary | ICD-10-CM

## 2019-10-02 DIAGNOSIS — E782 Mixed hyperlipidemia: Secondary | ICD-10-CM

## 2019-10-02 DIAGNOSIS — F419 Anxiety disorder, unspecified: Secondary | ICD-10-CM

## 2019-10-02 NOTE — Telephone Encounter (Signed)
[facility] is requesting to fill pt [facility]. Please advise KH °

## 2019-10-03 ENCOUNTER — Other Ambulatory Visit: Payer: Self-pay | Admitting: Family Medicine

## 2019-10-03 DIAGNOSIS — G250 Essential tremor: Secondary | ICD-10-CM

## 2019-10-03 NOTE — Telephone Encounter (Signed)
Is clonazpem ok to refill?

## 2020-01-04 ENCOUNTER — Other Ambulatory Visit: Payer: Self-pay | Admitting: Family Medicine

## 2020-01-04 DIAGNOSIS — G250 Essential tremor: Secondary | ICD-10-CM

## 2020-01-05 NOTE — Telephone Encounter (Signed)
Is this okay to refill? 

## 2020-01-05 NOTE — Telephone Encounter (Signed)
Called and left message for pt, I overlooked the medcheck part

## 2020-01-06 DIAGNOSIS — R69 Illness, unspecified: Secondary | ICD-10-CM | POA: Diagnosis not present

## 2020-01-14 NOTE — Telephone Encounter (Signed)
Called and left another message for pt

## 2020-01-20 ENCOUNTER — Telehealth: Payer: Self-pay | Admitting: *Deleted

## 2020-01-20 NOTE — Telephone Encounter (Signed)
Message left to return call to [facility] at [phone].   Patient is in 08 recall for 01/21. Patient needs to schedule OV for pap only.

## 2020-03-29 ENCOUNTER — Other Ambulatory Visit: Payer: Self-pay | Admitting: Family Medicine

## 2020-03-29 DIAGNOSIS — E782 Mixed hyperlipidemia: Secondary | ICD-10-CM

## 2020-03-29 DIAGNOSIS — G250 Essential tremor: Secondary | ICD-10-CM

## 2020-03-29 NOTE — Telephone Encounter (Signed)
You are correct. She is due for 6 month med check.  Please schedule. Nonfasting, A1c at visit  She is a week early for her med (last picked up her clonazepam 3/26), so go ahead and schedule med check for sometime in the next few weeks. You can approve the other meds, and send the clonazepam back after she schedules for approval

## 2020-03-29 NOTE — Telephone Encounter (Signed)
Are these okay to fill? I looked back at last OV and in the assesment it says she is due for 81mo f/u with A1C, the instructions only say follow up 1 year so she was only scheduled for a 1 year follow up. Please advise

## 2020-03-29 NOTE — Telephone Encounter (Signed)
Left message for patient to call back and schedule. Once she schedules I will fill the two and send the clonazepam back to Dr.Knapp.

## 2020-03-30 NOTE — Telephone Encounter (Signed)
Message left to return call to Hason Ofarrell at [phone].    

## 2020-03-31 NOTE — Telephone Encounter (Signed)
Left another message asking patient to please return my call. 

## 2020-04-01 ENCOUNTER — Other Ambulatory Visit: Payer: Self-pay | Admitting: Family Medicine

## 2020-04-01 ENCOUNTER — Telehealth: Payer: Self-pay

## 2020-04-01 DIAGNOSIS — G250 Essential tremor: Secondary | ICD-10-CM

## 2020-04-01 DIAGNOSIS — E782 Mixed hyperlipidemia: Secondary | ICD-10-CM

## 2020-04-01 MED ORDER — CLONAZEPAM 1 MG PO TABS: 1.0000 mg | ORAL_TABLET | Freq: Two times a day (BID) | ORAL | 0 refills | Status: DC

## 2020-04-01 NOTE — Telephone Encounter (Signed)
[facility] on the pts. Clonazepam 1mg  pt. Last apt MWV 08/25/19 next apt MWV 09/15/20.

## 2020-04-01 NOTE — Telephone Encounter (Signed)
Left another message.

## 2020-04-08 NOTE — Telephone Encounter (Signed)
Patient has not returned call x2 in regards to recall. Routing to K. Sprague, Nursing Supervisor.  

## 2020-04-08 NOTE — Telephone Encounter (Signed)
Letter to Dr.Miller. 

## 2020-04-12 NOTE — Telephone Encounter (Signed)
Letter mailed to patient's home address on file. Removed from 08 recall. Encounter closed.

## 2020-04-28 NOTE — Progress Notes (Signed)
Chief Complaint  Patient presents with  . other    med check and A1C, phq form given    Patient presents for 6 month med check  She is complaining of trouble hearing from her left ear, thinks it is filled with wax.  Impaired fasting glucose:she continues to try and limit her sweets. At her last visit she admitted to increased chocolate intake during the pandemic, and A1c was noted to be higher.  She has tried to cut back. Limits rice, pasta, uses whole wheat bread. Doesn't eat a lot of starchy vegetables. Shewalksalmost amile a day,3-4days/week, including a hill (20 minutes).Elliptical is broken. This has remained the same. She has cut back some on her chocolate (4 small pieces daily), but is also eating a handful of M&M's Lab Results  Component Value Date   HGBA1C 6.1 (H) 08/21/2019   Mixed hyperlipidemia: She reports being compliant with herCrestor. Patient is reportedly following a low-fat, low cholesterol diet. Compliant with medications and denies medication side effects.Doesn't take fish oil, due to causing bad breath.TG were mildly elevated on last check, related to increased chocolate intake. Lab Results  Component Value Date   CHOL 153 08/21/2019   HDL 63 08/21/2019   LDLCALC 64 08/21/2019   TRIG 152 (H) 08/21/2019   CHOLHDL 2.4 08/21/2019   Tremor--Tolerating propranolol without any side effects, along with the clonazepam (previously took nadolol, changed due to cost).  She is reporting that the propranolol is expensive also. She does still have some persistent tremor--notices it is a little harder to text on her phone, but not bothersome or even really noticeable to the patient otherwise.No problems eating, or with handwriting. Has been stable on this medication regimen. It only bothers her son, not her. (She previously tried mysoline, but didn't tolerate it ("sick all day after one pill").  GERD--She takes Prilosec OTC daily, and denies symptoms.She  hasrecurrent heartburn if she misses a pill, which hasn't happened in a while. Denies dysphagia.  Depression--she stopped Sertralineafewyears ago, didn't haveany recurrent depression afterstopping(has had recurrent depression when stopped in the past, related to work stress),but she had problems with insomnia. OTC meds didn't help.She didn't toleratetrazadonedue to nausea. Sertraline was restarted at that time, at 50mg , and has been doing well on this ever since, with no further sleep issues.  She bought a new bed, sleeps in a different room (with only a bed, no TV).  There is a strong family h/o suicide in her family (5 people). She is not interested in trying to stop the sertraline again, even though her sleep routine is very good now.   PMH, PSH, SH reviewed  Outpatient Encounter Medications as of 04/29/2020  Medication Sig Note  . aspirin 81 MG tablet Take 81 mg by mouth daily.   . Calcium Carbonate-Vitamin D (CALCIUM + D) 600-200 MG-UNIT TABS Take 600 mg by mouth 2 (two) times daily.    . clonazePAM (KLONOPIN) 1 MG tablet Take 1 tablet (1 mg total) by mouth 2 (two) times daily.   05/01/2020 omeprazole (PRILOSEC OTC) 20 MG tablet Take 20 mg by mouth daily.   . propranolol ER (INDERAL LA) 80 MG 24 hr capsule TAKE 1 CAPSULE(80 MG) BY MOUTH DAILY   . rosuvastatin (CRESTOR) 40 MG tablet TAKE 1 TABLET(40 MG) BY MOUTH DAILY   . sertraline (ZOLOFT) 50 MG tablet TAKE 1 TABLET BY MOUTH EVERY DAY   . diphenhydramine-acetaminophen (TYLENOL PM) 25-500 MG TABS tablet Take 2 tablets by mouth at bedtime as needed. (  Patient not taking: Reported on 04/29/2020) 08/16/2017: Uses prn, usually if has a headache when going to bed   No facility-administered encounter medications on file as of 04/29/2020.    ROS:  No fever, chills, URI symptoms or allergies, cough, shortness of breath, chest pain, palpitations, GI complaints, urinary complaints. She states her left ear is full of wax and can't hear well out of  it. Denies ear pain or tinnitus. Denies bleeding, bruising, rashes. Moods are good, no insomnia, sleeping well. No heartburn or any other concerns.   PHYSICAL EXAM:  BP 104/68   Pulse 66   Temp 98.7 F (37.1 C)   Wt 144 lb 12.8 oz (65.7 kg)   LMP 03/17/1999 (Approximate)   SpO2 97%   BMI 25.25 kg/m   Wt Readings from Last 3 Encounters:  04/29/20 144 lb 12.8 oz (65.7 kg)  08/25/19 147 lb (66.7 kg)  03/06/19 142 lb 4.8 oz (64.5 kg)    Well developed, pleasant female in good spirits.  HEENT: PERRL, EOMI, conjunctiva clear, OP clear. Wearing mask. R TM and EAC normal.  L TM not visualized due to cerumen impaction.  S/p lavage ear was re-examined--no further wax.  Some erythema of upper portion of TM.  No effusion, normal landmarks. Neck: no lymphadenopathy, thyromegaly or bruit Heart: regular rate and rhythm, no murmur Lungs: clear bilaterally Abdomen: soft, nontender, no mass Extremities: no edema, 2+ pulse Neuro: alert and oriented. Minimal tremor noted in hands, and no head tremor noted. Psych: normal mood, affect, hygiene and grooming Skin: normal turgor, no visible rashes/lesions  Lab Results  Component Value Date   HGBA1C 6.0 (A) 04/29/2020   PHQ-9 score of 0   ASSESSMENT/PLAN:  Impaired fasting glucose - slightly better.  Encouraged her to not buy M&M's or only snack size to limit portions. 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 Tolerated procedure well and hearing was improved afterwards   meds RF'd mid-June x 90d. No refills needed  F/u  for CPE/AWV as scheduled in 09/2020

## 2020-04-29 ENCOUNTER — Ambulatory Visit (INDEPENDENT_AMBULATORY_CARE_PROVIDER_SITE_OTHER): Payer: Medicare HMO | Admitting: Family Medicine

## 2020-04-29 ENCOUNTER — Encounter: Payer: Self-pay | Admitting: Family Medicine

## 2020-04-29 ENCOUNTER — Other Ambulatory Visit: Payer: Self-pay

## 2020-04-29 VITALS — BP 104/68 | HR 66 | Temp 98.7°F | Wt 144.8 lb

## 2020-04-29 DIAGNOSIS — H6122 Impacted cerumen, left ear: Secondary | ICD-10-CM

## 2020-04-29 DIAGNOSIS — R7301 Impaired fasting glucose: Secondary | ICD-10-CM

## 2020-04-29 DIAGNOSIS — E782 Mixed hyperlipidemia: Secondary | ICD-10-CM

## 2020-04-29 DIAGNOSIS — G250 Essential tremor: Secondary | ICD-10-CM | POA: Diagnosis not present

## 2020-04-29 DIAGNOSIS — G4700 Insomnia, unspecified: Secondary | ICD-10-CM

## 2020-04-29 DIAGNOSIS — R69 Illness, unspecified: Secondary | ICD-10-CM | POA: Diagnosis not present

## 2020-04-29 DIAGNOSIS — F325 Major depressive disorder, single episode, in full remission: Secondary | ICD-10-CM

## 2020-04-29 LAB — POCT GLYCOSYLATED HEMOGLOBIN (HGB A1C): Hemoglobin A1C: 6 % — AB (ref 4.0–5.6)

## 2020-04-29 NOTE — Patient Instructions (Addendum)
Your sugar was a little better, but still pre-diabetic. Try and cut out the M&M's, or make it easier to control portions by getting the small snack-size bags (like for Halloween).  Continue your current medications.    Earwax Buildup, Adult The ears produce a substance called earwax that helps keep bacteria out of the ear and protects the skin in the ear canal. Occasionally, earwax can build up in the ear and cause discomfort or hearing loss. What increases the risk? This condition is more likely to develop in people who:  Are female.  Are elderly.  Naturally produce more earwax.  Clean their ears often with cotton swabs.  Use earplugs often.  Use in-ear headphones often.  Wear hearing aids.  Have narrow ear canals.  Have earwax that is overly thick or sticky.  Have eczema.  Are dehydrated.  Have excess hair in the ear canal. What are the signs or symptoms? Symptoms of this condition include:  Reduced or muffled hearing.  A feeling of fullness in the ear or feeling that the ear is plugged.  Fluid coming from the ear.  Ear pain.  Ear itch.  Ringing in the ear.  Coughing.  An obvious piece of earwax that can be seen inside the ear canal. How is this diagnosed? This condition may be diagnosed based on:  Your symptoms.  Your medical history.  An ear exam. During the exam, your health care provider will look into your ear with an instrument called an otoscope. You may have tests, including a hearing test. How is this treated? This condition may be treated by:  Using ear drops to soften the earwax.  Having the earwax removed by a health care provider. The health care provider may: ? Flush the ear with water. ? Use an instrument that has a loop on the end (curette). ? Use a suction device.  Surgery to remove the wax buildup. This may be done in severe cases. Follow these instructions at home:   Take over-the-counter and prescription medicines only as  told by your health care provider.  Do not put any objects, including cotton swabs, into your ear. You can clean the opening of your ear canal with a washcloth or facial tissue.  Follow instructions from your health care provider about cleaning your ears. Do not over-clean your ears.  Drink enough fluid to keep your urine clear or pale yellow. This will help to thin the earwax.  Keep all follow-up visits as told by your health care provider. If earwax builds up in your ears often or if you use hearing aids, consider seeing your health care provider for routine, preventive ear cleanings. Ask your health care provider how often you should schedule your cleanings.  If you have hearing aids, clean them according to instructions from the manufacturer and your health care provider. Contact a health care provider if:  You have ear pain.  You develop a fever.  You have blood, pus, or other fluid coming from your ear.  You have hearing loss.  You have ringing in your ears that does not go away.  Your symptoms do not improve with treatment.  You feel like the room is spinning (vertigo). Summary  Earwax can build up in the ear and cause discomfort or hearing loss.  The most common symptoms of this condition include reduced or muffled hearing and a feeling of fullness in the ear or feeling that the ear is plugged.  This condition may be diagnosed based on  your symptoms, your medical history, and an ear exam.  This condition may be treated by using ear drops to soften the earwax or by having the earwax removed by a health care provider.  Do not put any objects, including cotton swabs, into your ear. You can clean the opening of your ear canal with a washcloth or facial tissue. This information is not intended to replace advice given to you by your health care provider. Make sure you discuss any questions you have with your health care provider. Document Revised: 09/14/2017 Document Reviewed:  12/13/2016 Elsevier Patient Education  2020 [employer].

## 2020-06-09 DIAGNOSIS — R69 Illness, unspecified: Secondary | ICD-10-CM | POA: Diagnosis not present

## 2020-06-23 ENCOUNTER — Other Ambulatory Visit: Payer: Self-pay | Admitting: Family Medicine

## 2020-06-23 DIAGNOSIS — E782 Mixed hyperlipidemia: Secondary | ICD-10-CM

## 2020-06-23 DIAGNOSIS — G250 Essential tremor: Secondary | ICD-10-CM

## 2020-07-05 ENCOUNTER — Other Ambulatory Visit: Payer: Self-pay | Admitting: Family Medicine

## 2020-07-05 DIAGNOSIS — G250 Essential tremor: Secondary | ICD-10-CM

## 2020-07-05 NOTE — Telephone Encounter (Signed)
Is this okay to refill? 

## 2020-09-13 ENCOUNTER — Telehealth: Payer: Self-pay | Admitting: Family Medicine

## 2020-09-13 ENCOUNTER — Telehealth: Payer: Self-pay | Admitting: *Deleted

## 2020-09-13 DIAGNOSIS — E782 Mixed hyperlipidemia: Secondary | ICD-10-CM

## 2020-09-13 DIAGNOSIS — Z0000 Encounter for general adult medical examination without abnormal findings: Secondary | ICD-10-CM

## 2020-09-13 DIAGNOSIS — Z5181 Encounter for therapeutic drug level monitoring: Secondary | ICD-10-CM

## 2020-09-13 DIAGNOSIS — R7301 Impaired fasting glucose: Secondary | ICD-10-CM

## 2020-09-13 NOTE — Telephone Encounter (Signed)
Error

## 2020-09-13 NOTE — Telephone Encounter (Signed)
Patient coming for AWV Wed afternoon fro AWV, would like to come tomorrow am for labs-need orders please.

## 2020-09-14 ENCOUNTER — Other Ambulatory Visit: Payer: Self-pay | Admitting: Family Medicine

## 2020-09-14 ENCOUNTER — Other Ambulatory Visit: Payer: Medicare HMO

## 2020-09-14 ENCOUNTER — Other Ambulatory Visit: Payer: Self-pay

## 2020-09-14 DIAGNOSIS — Z5181 Encounter for therapeutic drug level monitoring: Secondary | ICD-10-CM | POA: Diagnosis not present

## 2020-09-14 DIAGNOSIS — Z0000 Encounter for general adult medical examination without abnormal findings: Secondary | ICD-10-CM | POA: Diagnosis not present

## 2020-09-14 DIAGNOSIS — R7301 Impaired fasting glucose: Secondary | ICD-10-CM | POA: Diagnosis not present

## 2020-09-14 DIAGNOSIS — E782 Mixed hyperlipidemia: Secondary | ICD-10-CM | POA: Diagnosis not present

## 2020-09-14 DIAGNOSIS — G250 Essential tremor: Secondary | ICD-10-CM

## 2020-09-14 LAB — LIPID PANEL

## 2020-09-14 NOTE — Patient Instructions (Addendum)
HEALTH MAINTENANCE RECOMMENDATIONS:  It is recommended that you get at least 30 minutes of aerobic exercise at least 5 days/week (for weight loss, you may need as much as 60-90 minutes). This can be any activity that gets your heart rate up. This can be divided in 10-15 minute intervals if needed, but try and build up your endurance at least once a week.  Weight bearing exercise is also recommended twice weekly.  Eat a healthy diet with lots of vegetables, fruits and fiber.  "Colorful" foods have a lot of vitamins (ie green vegetables, tomatoes, red peppers, etc).  Limit sweet tea, regular sodas and alcoholic beverages, all of which has a lot of calories and sugar.  Up to 1 alcoholic drink daily may be beneficial for women (unless trying to lose weight, watch sugars).  Drink a lot of water.  Calcium recommendations are 1200-1500 mg daily (1500 mg for postmenopausal women or women without ovaries), and vitamin D 1000 IU daily.  This should be obtained from diet and/or supplements (vitamins), and calcium should not be taken all at once, but in divided doses.  Monthly self breast exams and yearly mammograms for women over the age of 25 is recommended.  Sunscreen of at least SPF 30 should be used on all sun-exposed parts of the skin when outside between the hours of 10 am and 4 pm (not just when at beach or pool, but even with exercise, golf, tennis, and yard work!)  Use a sunscreen that says "broad spectrum" so it covers both UVA and UVB rays, and make sure to reapply every 1-2 hours.  Remember to change the batteries in your smoke detectors when changing your clock times in the spring and fall. Carbon monoxide detectors are recommended for your home.  Use your seat belt every time you are in a car, and please drive safely and not be distracted with cell phones and texting while driving.   Lindsey Frank , Thank you for taking time to come for your Medicare Wellness Visit. I appreciate your ongoing  commitment to your health goals. Please review the following plan we discussed and let me know if I can assist you in the future.    This is a list of the screening recommended for you and due dates:  Health Maintenance  Topic Date Due  . Colon Cancer Screening  05/26/2018  . Tetanus Vaccine  04/17/2020  . Flu Shot  05/16/2020  . Mammogram  09/17/2021  . DEXA scan (bone density measurement)  Completed  . COVID-19 Vaccine  Completed  .  Hepatitis C: One time screening is recommended by Center for Disease Control  (CDC) for  adults born from [birth year] through 1964.   Completed  . Pneumonia vaccines  Completed   Call to schedule your mammogram, due this month.  You will need to get tetanus booster from the pharmacy.  This should be separated from other vaccines by 2 weeks.  I recommend getting the new shingles vaccine (Shingrix). If you have Medicare, you will need to get this from the pharmacy, as it is covered by Part D. This is a series of 2 injections, spaced 2 months apart.  It doesn't have to be exactly 2 months apart (but can't be under 2 months), if that isn't feasible for your schedule, but try and get them close to 2 months (and definitely within 6 months of each other, or else the efficacy of the vaccine drops off).  This can either be given the  same day as the tetanus shot, or separated by 2-4 weeks (per the pharmacist's recommendation).  You are past due for colon cancer screening. You promised [place] today that you will return your Cologuard, so we will put in another referral.  Contact Dr. Hyacinth Meeker to schedule your pap smear (you were due in 10/2019).  You can stop taking the daily aspirin--this may be affecting your stomach and not have as much benefit as we hoped for prevention of heart disease (new guidelines say only to take if you have KNOWN heart disease).

## 2020-09-14 NOTE — Telephone Encounter (Signed)
Pt came in for labs today and said she could wait until visit tomorrow for refills

## 2020-09-14 NOTE — Progress Notes (Signed)
Chief Complaint  Patient presents with  . Medicare Wellness    nonfasting (labs already done) AWV, no gyn exam-sees Dr. Sabra Heck. No concerns. Is going to schedule with Dr. Sabra Heck to have pap-does not want to have here today. Also, is aware that she has not done cologuard-she will absolutely do it if we order again-kit she has at home is out of date.    Lindsey Frank is a 74 y.o. female who presents for annual physical exam, Medicare wellness visit and follow-up on chronic medical conditions.  She had labs done prior to today's visit, see below. She has the following concerns:  Complains of low back pain with lifting, strenuous activity, changing bedding.  Impaired fasting glucose:she continues totry andlimit her sweets. She likes chocolate, but tries to limit portions. Limits rice, pasta, uses whole wheat bread. Doesn't eat a lot of starchy vegetables. She has cut back some on her chocolate (4 small pieces daily). No longer buying M&M's. Last A1c was 6.0% in 04/2020.  Mixed hyperlipidemia: She reports being compliant with herCrestor. Patient is reportedly following a low-fat, low cholesterol diet. Compliant with medications and denies medication side effects.Doesn't take fish oil, due to causing bad breath.  Tremor--Tolerating propranolol without any side effects, along with the clonazepam (previously took nadolol, changed due to cost).She does still have some persistent tremor--notices it is a little harder to text on her phone, but not bothersome or even really noticeable to the patientotherwise.No problems eating, or with handwriting.Has been stable on this medication regimen. (She previously tried mysoline, but didn't tolerate it ("sick all day after one pill").  GERD--She takes Prilosec OTC daily, and denies symptoms.She hasrecurrent heartburn if she misses a pill, which hasn't happened in a while. Denies dysphagia.  Depression--she stopped Sertralineafewyears ago,  didn't haveany recurrent depression afterstopping(has had recurrent depression when stopped in the past, related to work stress),but she had problems with insomnia. OTC meds didn't help.She didn't toleratetrazadonedue to nausea. Sertraline was restarted at that time, at 37m, and has been doing wellon this ever since, with no further sleep issues.She bought a new bed, sleeps in a different room (with only a bed, no TV).  There is a strong family h/o suicide in her family (5 people). She is not interested in trying to stop the sertraline again, even though her sleep routine is very good now, nor is she interested in lowering the dose.  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 She saw Dr. MSabra Heck and was due to see her again for repeat pap and HPV in 10/2019 (1 year follow-up).  She never scheduled this.  She is past due for f/u. Plans to call her and schedule this, declines GYN exam or pap today.   Immunization History  Administered Date(s) Administered  . Fluad Quad(high Dose 65+) 08/25/2019  . Influenza Split 08/16/2012, 07/28/2014  . Influenza, High Dose Seasonal PF 06/16/2015, 07/05/2016, 08/16/2017, 08/19/2018  . PFIZER SARS-COV-2 Vaccination 12/29/2019, 01/26/2020  . Pneumococcal Conjugate-13 05/27/2014  . Pneumococcal Polysaccharide-23 04/17/2010, 07/05/2016  . Td 10/16/2000  . Tdap 04/17/2010  . Zoster 03/03/2007   Last Pap smear:  08/2018, normal (atrophy), +HR HPV; tested negative for 16, 18/45 in 10/2018 by Dr. MSabra Heck  Last mammogram: 09/2019 Last colonoscopy: 8/09; referred for Cologuard the last 2 years, never done Last DEXA:  08/2018, T-1.1 Left fem neck Exercise: Has a new walking partner, walks slower.  Walking 20-30 minutes 3-4 days/week. No weight-bearing exercise. Goes to dentist every 6 months  Ophtho: regularly  Other doctors caring for patient include: GYN (Dr. Sabra Heck) Dentist (Dr.Vines)  Ophtho (Dr. Kathrin Penner)  GI (Dr. Amedeo Plenty at [facility],  2009)  Neuro (Dr. Rexene Alberts), no longer sees.  Fall screen: None Depression screen: negative Functional Status Survey:  Unremarkable. She will forget why she walked into a room, but eventually remembers.  Denies any other more serious memory concerns--she always remembers things, but it takes longer than it used to. Mini-Cog screen:  Normal. Clock hands were not based from a central position, but otherwise normal.  End of Life Discussion: Patient hasa living will and medical power of attorney.    PMH, PSH, SH and FH were reviewed and updated.  Outpatient Encounter Medications as of 09/15/2020  Medication Sig Note  . Calcium Carbonate-Vitamin D (CALCIUM + D) 600-200 MG-UNIT TABS Take 600 mg by mouth 2 (two) times daily.    . clonazePAM (KLONOPIN) 1 MG tablet TAKE 1 TABLET BY MOUTH TWICE A DAY   . omeprazole (PRILOSEC OTC) 20 MG tablet Take 20 mg by mouth daily.   . propranolol ER (INDERAL LA) 80 MG 24 hr capsule TAKE 1 CAPSULE(80 MG) BY MOUTH DAILY   . rosuvastatin (CRESTOR) 40 MG tablet TAKE 1 TABLET(40 MG) BY MOUTH DAILY   . sertraline (ZOLOFT) 50 MG tablet TAKE 1 TABLET BY MOUTH EVERY DAY   . [DISCONTINUED] aspirin 81 MG tablet Take 81 mg by mouth daily.   . diphenhydramine-acetaminophen (TYLENOL PM) 25-500 MG TABS tablet Take 2 tablets by mouth at bedtime as needed. (Patient not taking: Reported on 04/29/2020) 08/16/2017: Uses prn, usually if has a headache when going to bed   No facility-administered encounter medications on file as of 09/15/2020.   Taking aspirin prior to today's visit, when advised she can stop.  Allergies  Allergen Reactions  . Trazodone And Nefazodone Nausea Only  . Mysoline [Primidone] Nausea Only    Nausea and achey    ROS: The patient denies anorexia, fever,vision changes, decreased hearing, ear pain, sore throat, breast concerns, chest pain, palpitations, dizziness, syncope, dyspnea on exertion, cough, swelling, nausea, vomiting, diarrhea, constipation,  abdominal pain, melena, hematochezia, indigestion/heartburn, hematuria, incontinence, dysuria, vaginal bleeding, discharge, odor or itch, genital lesions, joint pains, numbness, tingling, weakness, depression, anxiety, abnormal bleeding/bruising, or enlarged lymph nodes. Some postnasal drainage, chronic (since childhood--doesn't desire treatment). Tremor per HPI, stable. Not bothersome to patient. Insomnia is well controlled.   PHYSICAL EXAM:  BP 104/64   Pulse 64   Ht 5' 3.5" (1.613 m)   Wt 143 lb 6.4 oz (65 kg)   LMP 03/17/1999 (Approximate)   BMI 25.00 kg/m    Wt Readings from Last 3 Encounters:  09/15/20 143 lb 6.4 oz (65 kg)  04/29/20 144 lb 12.8 oz (65.7 kg)  08/25/19 147 lb (66.7 kg)    General Appearance:  Alert, cooperative, no distress, appears stated age.She is sitting with very poor posture, tending to gradually lean back while seated, straightens herself up periodically, only to have her gradually lean back again.  Head:  Normocephalic, without obvious abnormality, atraumatic.  Eyes:  PERRL, conjunctiva/corneas clear, EOM's intact, fundi benign   Ears:  Normal TM's and external ear canals   Nose:  Not examined, wearing mask due to COVID-19 pandemic  Throat:  Not examined, wearing mask due to COVID-19 pandemic  Neck:  Supple, no lymphadenopathy; thyroid: no enlargement/tenderness/ nodules; no carotid bruit or JVD   Back:  Spine nontender, no CVA tenderness. Some kyphosis of lower thoracic spine  Lungs:  Clear to auscultation bilaterally without wheezes, rales or ronchi; respirations unlabored   Chest Wall:  No tenderness or deformity   Heart:  Regular rate and rhythm, S1 and S2 normal, no murmur, rub or gallop   Breast Exam:  No tenderness, masses, or nipple discharge or inversion. No axillary lymphadenopathy   Abdomen:  Soft, non-tender, nondistended, normoactive bowel sounds, no masses, no hepatosplenomegaly   Genitalia:  Deferred to GYN    Rectal:  Deferred (patient declined to undress from waist down today)  Extremities:  No clubbing, cyanosis or edema   Pulses:  2+ and symmetric all extremities   Skin:  Skin color, texture, turgor normal, no rashes or lesions.  Lymph nodes:  Cervical, supraclavicular, and axillary nodes normal   Neurologic:  Normal strength, sensation; reflexes are hyperreflexic (3+) and symmetric throughout. Only minimal tremor, and no significant head-bobbing noted.       Psych: Normal mood, affect, hygiene and grooming   Lab Results  Component Value Date   HGBA1C 6.1 (H) 09/14/2020   Fasting glucose 95    Chemistry      Component Value Date/Time   NA 146 (H) 09/14/2020 0831   K 4.5 09/14/2020 0831   CL 106 09/14/2020 0831   CO2 23 09/14/2020 0831   BUN 17 09/14/2020 0831   CREATININE 1.09 (H) 09/14/2020 0831   CREATININE 0.91 08/16/2017 1112      Component Value Date/Time   CALCIUM 9.9 09/14/2020 0831   ALKPHOS 59 09/14/2020 0831   AST 14 09/14/2020 0831   ALT 8 09/14/2020 0831   BILITOT 0.4 09/14/2020 0831     Lab Results  Component Value Date   WBC 7.8 09/14/2020   HGB 14.3 09/14/2020   HCT 42.4 09/14/2020   MCV 88 09/14/2020   PLT 236 09/14/2020   Lab Results  Component Value Date   CHOL 161 09/14/2020   HDL 60 09/14/2020   LDLCALC 75 09/14/2020   TRIG 150 (H) 09/14/2020   CHOLHDL 2.7 09/14/2020     ASSESSMENT/PLAN:  Annual physical exam  Medicare annual wellness visit, subsequent  Mixed hyperlipidemia - at goal, cont crestor and lowfat diet - Plan: rosuvastatin (CRESTOR) 40 MG tablet  Impaired fasting glucose - encouraged weight loss, limiting sweets, cont daily exercise  Depression, major, in remission ([place]) - Plan: sertraline (ZOLOFT) 50 MG tablet  Insomnia, unspecified type - controlled with zoloft  Colon cancer screening - refuses colonoscopy.  Promises to return Cologuard (this will be 3rd referral) - Plan:  Cologuard  Pap smear abnormality of cervix/human papillomavirus (HPV) positive - past due for pap smear with Dr. Sabra Heck.  Declines today, will schedule with Dr. Sabra Heck  Need for influenza vaccination - Plan: Pfizer SARS-COV-2 Vaccine  Need for viral immunization - Plan: Flu Vaccine QUAD High Dose(Fluad)  Benign essential tremor - stable on current regimen, continue - Plan: clonazePAM (KLONOPIN) 1 MG tablet, propranolol ER (INDERAL LA) 80 MG 24 hr capsule  Anxiety - controlled with zoloft.  (Clonazepam is for tremor); Patient declines trial of lower dose - Plan: sertraline (ZOLOFT) 50 MG tablet  Chronic low back pain without sciatica, unspecified back pain laterality - Weak trunk/core muscles, some kyphosis.  Refer for PT - Plan: AMB referral to rehabilitation  Discussed monthly self breast exams and yearly mammograms (due); at least 30 minutes of aerobic activity at least 5 days/week, weight bearing exercise 2x/week; proper sunscreen use reviewed; healthy diet, including goals of calcium and vitamin D intake and  alcohol recommendations (less than or equal to 1 drink/day) reviewed; regular seatbelt use; changing batteries in smoke detectors. Immunization recommendations discussed--high dose flu shot and COVID booster given.Shingrix recommended, risks/side effects reviewed, to get from pharmacy.Tdap due now (to get from pharmacy). Colon cancer screening recommendations reviewed, past due. She hasn't returned the Cologuard kit for the past 2 years.   Discussed Cologuard vs colonoscopy--she promises she will return the Cologuard test this time.  F/u 6 months (A1c at visit), and schedule CPE/AWV for 1 year  MOST form reviewed. Full Code, Full Care.    Medicare Attestation I have personally reviewed: The patient's medical and social history Their use of alcohol, tobacco or illicit drugs Their current medications and supplements The patient's functional ability including ADLs,fall risks, home  safety risks, cognitive, and hearing and visual impairment Diet and physical activities Evidence for depression or mood disorders  The patient's weight, height, BMI have been recorded in the chart.  I have made referrals, counseling, and provided education to the patient based on review of the above and I have provided the patient with a written personalized care plan for preventive services.

## 2020-09-15 ENCOUNTER — Encounter: Payer: Self-pay | Admitting: Family Medicine

## 2020-09-15 ENCOUNTER — Ambulatory Visit (INDEPENDENT_AMBULATORY_CARE_PROVIDER_SITE_OTHER): Payer: Medicare HMO | Admitting: Family Medicine

## 2020-09-15 VITALS — BP 104/64 | HR 64 | Ht 63.5 in | Wt 143.4 lb

## 2020-09-15 DIAGNOSIS — R7301 Impaired fasting glucose: Secondary | ICD-10-CM

## 2020-09-15 DIAGNOSIS — Z23 Encounter for immunization: Secondary | ICD-10-CM | POA: Diagnosis not present

## 2020-09-15 DIAGNOSIS — G4700 Insomnia, unspecified: Secondary | ICD-10-CM

## 2020-09-15 DIAGNOSIS — F419 Anxiety disorder, unspecified: Secondary | ICD-10-CM

## 2020-09-15 DIAGNOSIS — G250 Essential tremor: Secondary | ICD-10-CM | POA: Diagnosis not present

## 2020-09-15 DIAGNOSIS — M545 Low back pain, unspecified: Secondary | ICD-10-CM

## 2020-09-15 DIAGNOSIS — R87618 Other abnormal cytological findings on specimens from cervix uteri: Secondary | ICD-10-CM | POA: Diagnosis not present

## 2020-09-15 DIAGNOSIS — G8929 Other chronic pain: Secondary | ICD-10-CM

## 2020-09-15 DIAGNOSIS — Z1211 Encounter for screening for malignant neoplasm of colon: Secondary | ICD-10-CM | POA: Diagnosis not present

## 2020-09-15 DIAGNOSIS — F325 Major depressive disorder, single episode, in full remission: Secondary | ICD-10-CM

## 2020-09-15 DIAGNOSIS — E782 Mixed hyperlipidemia: Secondary | ICD-10-CM

## 2020-09-15 DIAGNOSIS — R69 Illness, unspecified: Secondary | ICD-10-CM | POA: Diagnosis not present

## 2020-09-15 DIAGNOSIS — Z0000 Encounter for general adult medical examination without abnormal findings: Secondary | ICD-10-CM

## 2020-09-15 LAB — CBC WITH DIFFERENTIAL/PLATELET
Basophils Absolute: 0.1 10*3/uL (ref 0.0–0.2)
Basos: 1 %
EOS (ABSOLUTE): 0.3 10*3/uL (ref 0.0–0.4)
Eos: 4 %
Hematocrit: 42.4 % (ref 34.0–46.6)
Hemoglobin: 14.3 g/dL (ref 11.1–15.9)
Immature Grans (Abs): 0 10*3/uL (ref 0.0–0.1)
Immature Granulocytes: 0 %
Lymphocytes Absolute: 3.1 10*3/uL (ref 0.7–3.1)
Lymphs: 40 %
MCH: 29.7 pg (ref 26.6–33.0)
MCHC: 33.7 g/dL (ref 31.5–35.7)
MCV: 88 fL (ref 79–97)
Monocytes Absolute: 0.6 10*3/uL (ref 0.1–0.9)
Monocytes: 8 %
Neutrophils Absolute: 3.6 10*3/uL (ref 1.4–7.0)
Neutrophils: 47 %
Platelets: 236 10*3/uL (ref 150–450)
RBC: 4.81 x10E6/uL (ref 3.77–5.28)
RDW: 12.9 % (ref 11.7–15.4)
WBC: 7.8 10*3/uL (ref 3.4–10.8)

## 2020-09-15 LAB — COMPREHENSIVE METABOLIC PANEL
ALT: 8 IU/L (ref 0–32)
AST: 14 IU/L (ref 0–40)
Albumin/Globulin Ratio: 2.4 — ABNORMAL HIGH (ref 1.2–2.2)
Albumin: 4.8 g/dL — ABNORMAL HIGH (ref 3.7–4.7)
Alkaline Phosphatase: 59 IU/L (ref 44–121)
BUN/Creatinine Ratio: 16 (ref 12–28)
BUN: 17 mg/dL (ref 8–27)
Bilirubin Total: 0.4 mg/dL (ref 0.0–1.2)
CO2: 23 mmol/L (ref 20–29)
Calcium: 9.9 mg/dL (ref 8.7–10.3)
Chloride: 106 mmol/L (ref 96–106)
Creatinine, Ser: 1.09 mg/dL — ABNORMAL HIGH (ref 0.57–1.00)
GFR calc Af Amer: 58 mL/min/{1.73_m2} — ABNORMAL LOW (ref >59)
GFR calc non Af Amer: 50 mL/min/{1.73_m2} — ABNORMAL LOW (ref >59)
Globulin, Total: 2 g/dL (ref 1.5–4.5)
Glucose: 95 mg/dL (ref 65–99)
Potassium: 4.5 mmol/L (ref 3.5–5.2)
Sodium: 146 mmol/L — ABNORMAL HIGH (ref 134–144)
Total Protein: 6.8 g/dL (ref 6.0–8.5)

## 2020-09-15 LAB — LIPID PANEL
Chol/HDL Ratio: 2.7 ratio (ref 0.0–4.4)
Cholesterol, Total: 161 mg/dL (ref 100–199)
HDL: 60 mg/dL (ref >39)
LDL Chol Calc (NIH): 75 mg/dL (ref 0–99)
Triglycerides: 150 mg/dL — ABNORMAL HIGH (ref 0–149)
VLDL Cholesterol Cal: 26 mg/dL (ref 5–40)

## 2020-09-15 LAB — HEMOGLOBIN A1C
Est. average glucose Bld gHb Est-mCnc: 128 mg/dL
Hgb A1c MFr Bld: 6.1 % — ABNORMAL HIGH (ref 4.8–5.6)

## 2020-09-15 MED ORDER — SERTRALINE HCL 50 MG PO TABS: 50.0000 mg | ORAL_TABLET | Freq: Every day | ORAL | 3 refills | Status: DC

## 2020-09-15 MED ORDER — PROPRANOLOL HCL ER 80 MG PO CP24: 80.0000 mg | ORAL_CAPSULE | Freq: Every day | ORAL | 3 refills | Status: DC

## 2020-09-15 MED ORDER — CLONAZEPAM 1 MG PO TABS: 1.0000 mg | ORAL_TABLET | Freq: Two times a day (BID) | ORAL | 1 refills | Status: DC

## 2020-09-15 MED ORDER — ROSUVASTATIN CALCIUM 40 MG PO TABS: 40.0000 mg | ORAL_TABLET | Freq: Every day | ORAL | 3 refills | Status: DC

## 2020-12-14 ENCOUNTER — Telehealth: Payer: Self-pay | Admitting: Family Medicine

## 2020-12-14 NOTE — Telephone Encounter (Signed)
Per my last note, she only needs A1c at visit.  She does not need prior lab appointment or to be fasting.

## 2020-12-14 NOTE — Telephone Encounter (Signed)
Pt has a medcheck June the 9th she wants to know if she needs to be fasting that appt and if so  Can she come in a couple of days before the appt

## 2020-12-20 ENCOUNTER — Telehealth: Payer: Self-pay | Admitting: Family Medicine

## 2020-12-20 NOTE — Telephone Encounter (Signed)
If she had a fall, then her issue should be evaluated in the office first. If appropriate, then I'll add the leg pain on to her PT. See if she can come this afternoon.

## 2020-12-20 NOTE — Telephone Encounter (Signed)
Called and left VM for pt that she did not need to be fasting

## 2020-12-20 NOTE — Telephone Encounter (Signed)
Called pt and left a message.

## 2020-12-20 NOTE — Telephone Encounter (Signed)
Pt left message on voice mail that she would like you to add her legs to the therapist order for Maura Crandall. Pt states she fell and would like her legs checked.

## 2020-12-23 ENCOUNTER — Ambulatory Visit: Payer: Medicare HMO | Attending: Family Medicine | Admitting: Physical Therapy

## 2020-12-23 ENCOUNTER — Encounter: Payer: Self-pay | Admitting: Physical Therapy

## 2020-12-23 ENCOUNTER — Other Ambulatory Visit: Payer: Self-pay

## 2020-12-23 DIAGNOSIS — R2689 Other abnormalities of gait and mobility: Secondary | ICD-10-CM | POA: Diagnosis not present

## 2020-12-23 DIAGNOSIS — R252 Cramp and spasm: Secondary | ICD-10-CM

## 2020-12-23 DIAGNOSIS — M545 Low back pain, unspecified: Secondary | ICD-10-CM | POA: Insufficient documentation

## 2020-12-23 DIAGNOSIS — G8929 Other chronic pain: Secondary | ICD-10-CM | POA: Insufficient documentation

## 2020-12-23 DIAGNOSIS — M6281 Muscle weakness (generalized): Secondary | ICD-10-CM | POA: Diagnosis not present

## 2020-12-23 NOTE — Patient Instructions (Addendum)
Posture Tips DO: - stand tall and erect - keep chin tucked in - keep head and shoulders in alignment - check posture regularly in mirror or large window - pull head back against headrest in car seat;  Change your position often.  Sit with lumbar support. DON'T: - slouch or slump while watching TV or reading - sit, stand or lie in one position  for too long;  Sitting is especially hard on the spine so if you sit at a desk/use the computer, then stand up often!   Copyright  VHI. All rights reserved.  Posture - Standing   Good posture is important. Avoid slouching and forward head thrust. Maintain curve in low back and align ears over shoul- ders, hips over ankles.  Pull your belly button in toward your back bone. Sit with even weight in your heels.  Ribs lifted up and chin down   Copyright  VHI. All rights reserved.  Posture - Sitting   Sit upright, head facing forward. Try using a roll to support lower back. Keep shoulders relaxed, and avoid rounded back. Keep hips level with knees. Avoid crossing legs for long periods. Sit on sit bones and not tail bone.  Do not cross your legs.      Garen Lah, PT, ATRIC Certified Exercise Expert for the Aging Adult  12/23/20 10:57 AM Phone: [phone] Fax: [phone]

## 2020-12-23 NOTE — Therapy (Signed)
[facility] [address] [place], [place], [address] Phone: [phone]   Fax:  [phone]  Physical Therapy Evaluation  Patient Details  Name: Lindsey Frank MRN: [id number] Date of Birth: Feb 14, 1946 Referring Provider (PT): [name] MD   Encounter Date: 12/23/2020   PT End of Session - 12/23/20 1104    Visit Number 1    Number of Visits 16    Date for PT Re-Evaluation 02/17/21    Authorization Type AEtna  ionto not covered    PT Start Time 1016    PT Stop Time 1100    PT Time Calculation (min) 44 min           Past Medical History:  Diagnosis Date  . Abnormal Pap smear of cervix 2019   HR HPV + recurrent   . Depression   . Elevated C-reactive protein (CRP) 01/2005  . Elevated cholesterol 01/2005  . Elevated triglycerides with high cholesterol 01/2005  . GERD (gastroesophageal reflux disease)   . History of endometriosis   . Migraine   . Tremor, essential     Past Surgical History:  Procedure Laterality Date  . BREAST LUMPECTOMY Right 1992   small lumpectomy for benign tumor  . CATARACT EXTRACTION Left 01/02/2018   Dr. Dagoberto Ligas  . COSMETIC SURGERY  2008   neck lift  . LAPAROSCOPY  1981   endometriosis  . TONSILECTOMY, ADENOIDECTOMY, BILATERAL MYRINGOTOMY AND TUBES  1953   age 51  . UTERINE FIBROID SURGERY  1977   with appendectomy    There were no vitals filed for this visit.    Subjective Assessment - 12/23/20 1020    Subjective I have back pain when I try to do strenuous household chores.  sweep and vacuum.  I was in the grocery story on 12-21-20., my legs feel weak and I was having trouble pushing a car.  A lady tried to push my cart for me on my way to the check out  I was in her way and she pushed and I fell down.  I have not seen a doctor. I sleep comfortably all night I love to read and to garden and I am an Runner, broadcasting/film/video and love to watch TV  I walk down the block for exercise. When I am  sitting on a commode I normally dont need to use my hands but lately I have had to use my arms to help me up.    Pertinent History essential tremors for years. GERD , depression, Hypercholesteremia,    Limitations House hold activities;Walking    How long can you sit comfortably? unlimited with pillow support    How long can you stand comfortably? 15 min    How long can you walk comfortably? since I fell I dont walk  I dont feel steady    Diagnostic tests none recently    Patient Stated Goals I want to be able to put on my bed sheet.    Currently in Pain? Yes    Pain Score 7     Pain Location Back    Pain Orientation Right;Left;Mid    Pain Descriptors / Indicators Aching    Pain Type Acute pain;Chronic pain    Pain Radiating Towards no radiation of pain in knees.    Pain Onset In the past 7 days   Acute on chronic due fall   Pain Frequency Constant    Aggravating Factors  trying to put sheet on bed.  Reaching  overhead on kitchen shelf uses a hold bar in the shower    Pain Relieving Factors Tylenol              [facility] PT Assessment - 12/23/20 0001      Assessment   Medical Diagnosis chronic bil back pain without sciatica    Referring Provider (PT) [name] MD    Onset Date/Surgical Date 12/21/20   acute on chronic after fall 12-21-20   Hand Dominance Right    Prior Therapy none      Precautions   Precautions Fall    Precaution Comments I have not fallen at all in 6 months except for 12-21-20 but I now feel weak.      Restrictions   Weight Bearing Restrictions No      Balance Screen   Has the patient fallen in the past 6 months Yes    How many times? 1   12-21-20 fall at the grocery store     Home Environment   Living Environment Private residence    Living Arrangements Alone    Type of Home House    Home Access Level entry    Home Layout One level      Prior Function   Level of Independence Independent    Vocation Retired    Leisure Runner, broadcasting/film/video       Cognition   Overall Cognitive Status Within Functional Limits for tasks assessed      Observation/Other Assessments   Focus on Therapeutic Outcomes (FOTO)  FOTO intake 40% predicted 70%      Functional Tests   Functional tests Sit to Stand      Sit to Stand   Comments 25.52 sec 5 x STS      Posture/Postural Control   Posture/Postural Control Postural limitations    Postural Limitations Increased thoracic kyphosis;Rounded Shoulders;Forward head;Flexed trunk    Posture Comments 14 cm Occiput to wall  Scoliosis r convex      ROM / Strength   AROM / PROM / Strength AROM;Strength      AROM   Overall AROM Comments Supervision for safety    Right Hip Flexion 110    Right Hip External Rotation  30    Right Hip Internal Rotation  20    Left Hip Flexion 110    Left Hip External Rotation  35    Left Hip Internal Rotation  22    Lumbar Flexion 80   able to touch shins  CGA for safety   Lumbar Extension 30    Lumbar - Right Side Bend 20    Lumbar - Left Side Bend 18    Lumbar - Right Rotation 50%    Lumbar - Left Rotation 50%      Strength   Overall Strength Deficits    Right Hip Flexion 4/5    Right Hip Extension 4-/5    Right Hip ABduction 3-/5    Left Hip Flexion 4-/5    Left Hip Extension 4-/5    Left Hip ABduction 3-/5    Right Knee Flexion 4/5    Right Knee Extension 4-/5   pain with bruise on knee after fall   Left Knee Flexion 4/5    Left Knee Extension 4/5    Right Ankle Dorsiflexion 4/5    Right Ankle Plantar Flexion 3-/5    Left Ankle Dorsiflexion 4/5    Left Ankle Plantar Flexion 3-/5      Palpation   Palpation comment noted  bruise over R patella after fall. bil QL tenderness  hypomobility in lumbar sp processes      Ambulation/Gait   Assistive device None    Gait Pattern Decreased trunk rotation;Wide base of support;Decreased hip/knee flexion - right    Ambulation Surface Level    Gait velocity 0.98 ft /sec    Gait Comments Pt with soreness and weakness in  LE  pt told to utilize cane and bring with her to clinic next visit to encourage more normalized gait pattern.                      Objective measurements completed on examination: See above findings.               PT Education - 12/23/20 1057    Education Details POC Explanation of findings.  Initial HEP and intial posture.    Person(s) Educated Patient    Methods Explanation;Demonstration;Tactile cues;Verbal cues;Handout    Comprehension Verbalized understanding;Returned demonstration            PT Short Term Goals - 12/23/20 1216      PT SHORT TERM GOAL #1   Title Independent with initial HEP    Baseline nonknowledge    Time 3    Period Weeks    Status New    Target Date 01/13/21      PT SHORT TERM GOAL #2   Title Pt will ambulate with LRAD and improved gait velocity to at least 1. 65 ft/sec    Baseline gait velocity  0.98 ft/ sec    Time 3    Period Weeks    Status New    Target Date 01/13/21      PT SHORT TERM GOAL #3   Title Pt low back pain will improve  to at least 4/10 with functional activities    Baseline 7/10    Time 3    Period Weeks    Status New    Target Date 01/13/21             PT Long Term Goals - 12/23/20 1058      PT LONG TERM GOAL #1   Title Independent with advanced HEP    Baseline no  knowledge    Time 8    Period Weeks    Status New    Target Date 02/17/21      PT LONG TERM GOAL #2   Title Pt will be able to rise from floor to mat independently using strategies for fall preparedness    Baseline unable and fearful of falling and getting on ground    Time 8    Period Weeks    Status New    Target Date 02/17/21      PT LONG TERM GOAL #3   Title . FOTO will improve from 40%intake   to 70%    indicating improved functional mobility.    Baseline eval 40%    Time 8    Period Weeks    Status New    Target Date 02/17/21      PT LONG TERM GOAL #4   Title Pt will demonstrate independent technique in order  to rise and descend to the floor inorder to continue living safely at home alone    Baseline unable to do floor transfer    Time 8    Period Weeks    Status New    Target Date 02/17/21      PT  LONG TERM GOAL #5   Title Pt will show improved LE strength by improving 5 x STS to 15 sec of less and show decrease risk of fall    Baseline 5 x STS 25.52 sec    Time 8    Period Weeks    Status New    Target Date 02/17/21                  Plan - 12/23/20 1052    Clinical Impression Statement Ms Watt presents to clinic without AD but gait velocity at 0.98 and clearly tentative about walking.  Pt reports falling in grocery store on 12-21-20 and presents with R bruised knee.  She also reports that she has noticed she has been having more difficulty rising from the toilet lately. Pt has scoliosis and profound kyphosis which includes higher risk of fall. Pt with 5 X STS at 25.50 sec which also indicates high risk of fall  Pt wants to be able to live safely alone in home and to continue enjoying gardening and walking a block in her neighborhood which she is unable to do now.  Pt will benefit from skilled PT to address impairments  as pain, muscle weakness, decreased AROM , abnormal posture as listed below. pt will benefit from 2 times a week for 8 weeks to address above impairments and functional limitations to promote pt independent living.    Personal Factors and Comorbidities Age;Comorbidity 1;Comorbidity 2    Comorbidities essential tremors for years. GERD , depression, Hypercholesteremia, see med history    Examination-Activity Limitations Bend;Dressing;Hygiene/Grooming;Lift;Locomotion Level;Reach Overhead;Stand;Stairs;Squat    Examination-Participation Restrictions Yard Work    Stability/Clinical Decision Making Evolving/Moderate complexity    Clinical Decision Making Moderate    Rehab Potential Good    PT Frequency 2x / week    PT Duration 8 weeks    PT Treatment/Interventions  ADLs/Self Care Home Management;Aquatic Therapy;Cryotherapy;Physicist, medical;Therapeutic exercise;Therapeutic activities;Functional mobility training;Stair training;Gait training;Neuromuscular re-education;Patient/family education;Passive range of motion;Manual techniques;Dry needling;Taping;Spinal Manipulations;Joint Manipulations    PT Next Visit Plan Balance assessment.  BERG, Reveiw HEP, go over FOTO report,  gait with cane , Stairs    PT Home Exercise Plan [email or web address]    Consulted and Agree with Plan of Care Patient           Patient will benefit from skilled therapeutic intervention in order to improve the following deficits and impairments:  Pain,Obesity,Improper body mechanics,Postural dysfunction,Abnormal gait,Decreased activity tolerance,Decreased mobility,Decreased range of motion,Decreased strength,Impaired flexibility,Increased muscle spasms,Decreased endurance  Visit Diagnosis: Chronic bilateral low back pain without sciatica  Muscle weakness (generalized)  Other abnormalities of gait and mobility  Cramp and spasm   Access Code: [email or web address]: [email or web address]: 03/10/2022Prepared by: Wayland Denis BeardsleyExercises  Supine Pelvic Tilt - 1 x daily - 7 x weekly - 2 sets - 10 reps  Supine Single Knee to Chest Stretch - 1 x daily - 7 x weekly - 1 sets - 5 reps - 10 hold  Supine Lower Trunk Rotation - 1 x daily - 7 x weekly - 1 sets - 5 reps - 20 sec hold  Supine Bridge - 1 x daily - 7 x weekly - 3 sets - 10 reps  Seated Hamstring Stretch - 1 x daily - 7 x weekly - 3 sets - 10 reps  Sit to Stand with Counter Support - 3 x daily - 7 x weekly - 1 sets - 10 reps    Problem List Patient Active Problem List   Diagnosis Date  Noted  . Impaired fasting glucose 07/04/2016  . Depression, major, in remission (HCC) 11/17/2013  . GERD (gastroesophageal reflux disease) 05/14/2013  . Tobacco use disorder 04/24/2012  . Mixed  hyperlipidemia 10/26/2011  . 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 Depressive disorder, not elsewhere classified 03/30/2011  . Benign essential tremor 02/27/2011  . Pure hypercholesterolemia 02/27/2011  . Migraine     Garen LahLawrie Bladyn Tipps, PT, [facility] Certified Exercise Expert for the Aging Adult  12/23/20 12:40 PM Phone: [phone] Fax: [phone]  [facility] [address] [place], [place], [address] Phone: [phone]   Fax:  [phone]  Name: Lindsey Frank MRN: [id number] Date of Birth: 09/03/1946

## 2020-12-28 ENCOUNTER — Ambulatory Visit: Payer: Medicare HMO | Admitting: Physical Therapy

## 2020-12-28 ENCOUNTER — Other Ambulatory Visit: Payer: Self-pay

## 2020-12-28 ENCOUNTER — Encounter: Payer: Self-pay | Admitting: Physical Therapy

## 2020-12-28 DIAGNOSIS — M545 Low back pain, unspecified: Secondary | ICD-10-CM

## 2020-12-28 DIAGNOSIS — R252 Cramp and spasm: Secondary | ICD-10-CM | POA: Diagnosis not present

## 2020-12-28 DIAGNOSIS — M6281 Muscle weakness (generalized): Secondary | ICD-10-CM

## 2020-12-28 DIAGNOSIS — R2689 Other abnormalities of gait and mobility: Secondary | ICD-10-CM | POA: Diagnosis not present

## 2020-12-28 DIAGNOSIS — G8929 Other chronic pain: Secondary | ICD-10-CM

## 2020-12-28 NOTE — Therapy (Signed)
[facility] [address] [place], [place], [address] Phone: [phone]   Fax:  [phone]  Physical Therapy Treatment  Patient Details  Name: RAYLA PEMBER MRN: [id number] Date of Birth: 19-Jan-1946 Referring Provider (PT): [name] MD   Encounter Date: 12/28/2020   PT End of Session - 12/28/20 1246    Visit Number 2    Number of Visits 16    Date for PT Re-Evaluation 02/17/21    Authorization Type AEtna  ionto not covered    PT Start Time 1104    PT Stop Time 1146    PT Time Calculation (min) 42 min    Equipment Utilized During Treatment Gait belt    Activity Tolerance Patient tolerated treatment well    Behavior During Therapy [facility] for tasks assessed/performed           Past Medical History:  Diagnosis Date  . Abnormal Pap smear of cervix 2019   HR HPV + recurrent   . Depression   . Elevated C-reactive protein (CRP) 01/2005  . Elevated cholesterol 01/2005  . Elevated triglycerides with high cholesterol 01/2005  . GERD (gastroesophageal reflux disease)   . History of endometriosis   . Migraine   . Tremor, essential     Past Surgical History:  Procedure Laterality Date  . BREAST LUMPECTOMY Right 1992   small lumpectomy for benign tumor  . CATARACT EXTRACTION Left 01/02/2018   Dr. Dagoberto Ligas  . COSMETIC SURGERY  2008   neck lift  . LAPAROSCOPY  1981   endometriosis  . TONSILECTOMY, ADENOIDECTOMY, BILATERAL MYRINGOTOMY AND TUBES  1953   age 17  . UTERINE FIBROID SURGERY  1977   with appendectomy    There were no vitals filed for this visit.   Subjective Assessment - 12/28/20 1110    Subjective I went to the grocery store and my legs held up for me.  I would like to get a handicapped sticker for my car    Pertinent History essential tremors for years. GERD , depression, Hypercholesteremia,    Limitations House hold activities;Walking    Patient Stated Goals I want to be able to put on my bed sheet.     Currently in Pain? Yes    Pain Score 6     Pain Location Back    Pain Orientation Right;Left;Mid    Pain Descriptors / Indicators Aching              [facility] PT Assessment - 12/28/20 0001      Ambulation/Gait   Ambulation/Gait Yes    Ambulation/Gait Assistance 6: Modified independent (Device/Increase time)   using cane in clinic but did not bring cane to clinic   Ambulation Distance (Feet) 200 Feet    Assistive device Straight cane    Gait Pattern Decreased trunk rotation;Wide base of support;Decreased hip/knee flexion - right    Ambulation Surface Level    Stairs Yes    Stairs Assistance 6: Modified independent (Device/Increase time)    Stair Management Technique One rail Right;With cane    Number of Stairs 20   4 steps x 5 with VC   Height of Stairs 6    Gait Comments importance of using AD at all times due to balance deficits  Pt is uncoordinated with use of cane and needs VC and reninforcement      Standardized Balance Assessment   Standardized Balance Assessment Berg Balance Test      [facility] Balance Test  Sit to Stand Able to stand without using hands and stabilize independently    Standing Unsupported Able to stand safely 2 minutes    Sitting with Back Unsupported but Feet Supported on Floor or Stool Able to sit safely and securely 2 minutes    Stand to Sit Sits safely with minimal use of hands    Transfers Able to transfer safely, minor use of hands    Standing Unsupported with Eyes Closed Able to stand 10 seconds with supervision    Standing Unsupported with Feet Together Able to place feet together independently and stand for 1 minute with supervision    From Standing, Reach Forward with Outstretched Arm Can reach forward >12 cm safely (5")    From Standing Position, Pick up Object from Floor Able to pick up shoe safely and easily    From Standing Position, Turn to Look Behind Over each Shoulder Looks behind one side only/other side shows less weight shift    Turn  360 Degrees Able to turn 360 degrees safely but slowly    Standing Unsupported, Alternately Place Feet on Step/Stool Needs assistance to keep from falling or unable to try    Standing Unsupported, One Foot in [employer] balance while stepping or standing    Standing on One Leg Tries to lift leg/unable to hold 3 seconds but remains standing independently    Total Score 39    Berg comment: 39/56  must use AD at all times                         [facility] Adult PT Treatment/Exercise - 12/28/20 0001      Exercises   Exercises Knee/Hip      Lumbar Exercises: Stretches   Single Knee to Chest Stretch 5 reps;10 seconds;Right;Left    Lower Trunk Rotation 5 reps;20 seconds    Lower Trunk Rotation Limitations R and L      Lumbar Exercises: Supine   Ab Set 10 reps    Pelvic Tilt 10 reps    Pelvic Tilt Limitations 10 sec hold    Bridge 10 reps;3 seconds      Knee/Hip Exercises: Stretches   Active Hamstring Stretch 30 seconds;2 reps;Left;Right    Active Hamstring Stretch Limitations sitting hamstring stretch      Knee/Hip Exercises: Standing   Lateral Step Up 10 reps;Right;Left;Hand Hold: 2    Lateral Step Up Limitations VC and TC    Forward Step Up 10 reps;Left;Right;Hand Hold: 2    Forward Step Up Limitations TC and VC    Other Standing Knee Exercises sit to stand at counter x 10  standing at counter for mini .lunge 10 x on R and 10 x on Left with ther ex pad for knee contact                  PT Education - 12/28/20 1254    Education Details BERG explanation and FOTO report reviewed HEP    Person(s) Educated Patient    Methods Explanation;Demonstration    Comprehension Verbalized understanding;Returned demonstration            PT Short Term Goals - 12/23/20 1216      PT SHORT TERM GOAL #1   Title Independent with initial HEP    Baseline nonknowledge    Time 3    Period Weeks    Status New    Target Date 01/13/21      PT SHORT TERM GOAL #  2   Title Pt  will ambulate with LRAD and improved gait velocity to at least 1. 65 ft/sec    Baseline gait velocity  0.98 ft/ sec    Time 3    Period Weeks    Status New    Target Date 01/13/21      PT SHORT TERM GOAL #3   Title Pt low back pain will improve  to at least 4/10 with functional activities    Baseline 7/10    Time 3    Period Weeks    Status New    Target Date 01/13/21             PT Long Term Goals - 12/23/20 1058      PT LONG TERM GOAL #1   Title Independent with advanced HEP    Baseline no  knowledge    Time 8    Period Weeks    Status New    Target Date 02/17/21      PT LONG TERM GOAL #2   Title Pt will be able to rise from floor to mat independently using strategies for fall preparedness    Baseline unable and fearful of falling and getting on ground    Time 8    Period Weeks    Status New    Target Date 02/17/21      PT LONG TERM GOAL #3   Title . FOTO will improve from 40%intake   to 70%    indicating improved functional mobility.    Baseline eval 40%    Time 8    Period Weeks    Status New    Target Date 02/17/21      PT LONG TERM GOAL #4   Title Pt will demonstrate independent technique in order to rise and descend to the floor inorder to continue living safely at home alone    Baseline unable to do floor transfer    Time 8    Period Weeks    Status New    Target Date 02/17/21      PT LONG TERM GOAL #5   Title Pt will show improved LE strength by improving 5 x STS to 15 sec of less and show decrease risk of fall    Baseline 5 x STS 25.52 sec    Time 8    Period Weeks    Status New    Target Date 02/17/21                 Plan - 12/28/20 1119    Clinical Impression Statement Pt BERG balance 39/56 and educated on need to have AD at every visit . Pt again left her cane in the car.  Pt has definite uncoordinated efforts with single leg activities and narrow base of support.  Pt has more difficulty on the steps and problems coordination with  cane on steps.  Pt needs assisstance with mini lunge at counter. Pt commented on being sore from exericises but understands she will be sore as she builds muscle strength.  Pt seems to be consistent with exercises given at home.  Pt lives alone and desires to continue to live independently and needs to gain strength and ability to walk without heavy dependence on AD    Personal Factors and Comorbidities Age;Comorbidity 1;Comorbidity 2    Comorbidities essential tremors for years. GERD , depression, Hypercholesteremia, see med history    Examination-Activity Limitations Bend;Dressing;Hygiene/Grooming;Lift;Locomotion Level;Reach Overhead;Stand;Stairs;Squat    PT Treatment/Interventions ADLs/Self  Care Home Management;Aquatic Therapy;Cryotherapy;Physicist, medical;Therapeutic exercise;Therapeutic activities;Functional mobility training;Stair training;Gait training;Neuromuscular re-education;Patient/family education;Passive range of motion;Manual techniques;Dry needling;Taping;Spinal Manipulations;Joint Manipulations    PT Next Visit Plan ,  gait with cane , Stairs  add mini  lunge to HEP and squat with weights , step ups with hand rail.  gait with cane    PT Home Exercise Plan [email or web address]    Consulted and Agree with Plan of Care Patient           Patient will benefit from skilled therapeutic intervention in order to improve the following deficits and impairments:  Pain,Obesity,Improper body mechanics,Postural dysfunction,Abnormal gait,Decreased activity tolerance,Decreased mobility,Decreased range of motion,Decreased strength,Impaired flexibility,Increased muscle spasms,Decreased endurance  Visit Diagnosis: Chronic bilateral low back pain without sciatica  Muscle weakness (generalized)  Other abnormalities of gait and mobility  Cramp and spasm     Problem List Patient Active Problem List   Diagnosis Date Noted  . Impaired fasting glucose 07/04/2016   . Depression, major, in remission (HCC) 11/17/2013  . GERD (gastroesophageal reflux disease) 05/14/2013  . Tobacco use disorder 04/24/2012  . Mixed hyperlipidemia 10/26/2011  . 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 Depressive disorder, not elsewhere classified 03/30/2011  . Benign essential tremor 02/27/2011  . Pure hypercholesterolemia 02/27/2011  . Migraine    Garen Lah, PT, [facility] Certified Exercise Expert for the Aging Adult  12/28/20 12:55 PM Phone: [phone] Fax: [phone]  [facility] [address] [place], [place], [address] Phone: [phone]   Fax:  [phone]  Name: KRYSTEENA STALKER MRN: [id number] Date of Birth: 1946/07/06

## 2021-01-06 ENCOUNTER — Encounter: Payer: Self-pay | Admitting: Physical Therapy

## 2021-01-06 ENCOUNTER — Other Ambulatory Visit: Payer: Self-pay

## 2021-01-06 ENCOUNTER — Ambulatory Visit: Payer: Medicare HMO | Admitting: Physical Therapy

## 2021-01-06 DIAGNOSIS — G8929 Other chronic pain: Secondary | ICD-10-CM | POA: Diagnosis not present

## 2021-01-06 DIAGNOSIS — R2689 Other abnormalities of gait and mobility: Secondary | ICD-10-CM

## 2021-01-06 DIAGNOSIS — M545 Low back pain, unspecified: Secondary | ICD-10-CM

## 2021-01-06 DIAGNOSIS — M6281 Muscle weakness (generalized): Secondary | ICD-10-CM | POA: Diagnosis not present

## 2021-01-06 DIAGNOSIS — R252 Cramp and spasm: Secondary | ICD-10-CM | POA: Diagnosis not present

## 2021-01-06 NOTE — Therapy (Signed)
[facility] [address] [place], [place], [address] Phone: [phone]   Fax:  [phone]  Physical Therapy Treatment  Patient Details  Name: Lindsey Frank MRN: [id number] Date of Birth: 1946-02-18 Referring Provider (PT): [name] MD   Encounter Date: 01/06/2021   PT End of Session - 01/06/21 1028    Visit Number 3    Number of Visits 16    Date for PT Re-Evaluation 02/17/21    Authorization Type AEtna  ionto not covered    PT Start Time 1015    PT Stop Time 1100    PT Time Calculation (min) 45 min           Past Medical History:  Diagnosis Date  . Abnormal Pap smear of cervix 2019   HR HPV + recurrent   . Depression   . Elevated C-reactive protein (CRP) 01/2005  . Elevated cholesterol 01/2005  . Elevated triglycerides with high cholesterol 01/2005  . GERD (gastroesophageal reflux disease)   . History of endometriosis   . Migraine   . Tremor, essential     Past Surgical History:  Procedure Laterality Date  . BREAST LUMPECTOMY Right 1992   small lumpectomy for benign tumor  . CATARACT EXTRACTION Left 01/02/2018   Dr. Dagoberto Ligas  . COSMETIC SURGERY  2008   neck lift  . LAPAROSCOPY  1981   endometriosis  . TONSILECTOMY, ADENOIDECTOMY, BILATERAL MYRINGOTOMY AND TUBES  1953   age 13  . UTERINE FIBROID SURGERY  1977   with appendectomy    There were no vitals filed for this visit.   Subjective Assessment - 01/06/21 1027    Subjective No pain right now. I have een working on my posture and I do all the exercises in the morning.    Currently in Pain? No/denies                             [facility] Adult PT Treatment/Exercise - 01/06/21 0001      Ambulation/Gait   Ambulation Distance (Feet) 200 Feet    Assistive device Small based quad cane    Ambulation Surface Level    Gait Comments mod cues for sequencing using quad cane in clinic, did much better using clinic SPC, up and down 6  inch stairs with light handrail use      Lumbar Exercises: Stretches   Single Knee to Chest Stretch 5 reps;10 seconds;Right;Left    Lower Trunk Rotation 5 reps;20 seconds    Lower Trunk Rotation Limitations R and L      Lumbar Exercises: Aerobic   Nustep L3 x 5 minutes UE/LE      Lumbar Exercises: Supine   Pelvic Tilt 10 reps    Pelvic Tilt Limitations 5 sec, max cues    Bridge 10 reps;3 seconds      Knee/Hip Exercises: Standing   Lateral Step Up 2 sets;10 reps;Step Height: 4";Hand Hold: 2;Right;Left    Forward Step Up Right;Left;10 reps;Step Height: 6";Hand Hold: 1                    PT Short Term Goals - 12/23/20 1216      PT SHORT TERM GOAL #1   Title Independent with initial HEP    Baseline nonknowledge    Time 3    Period Weeks    Status New    Target Date 01/13/21      PT SHORT  TERM GOAL #2   Title Pt will ambulate with LRAD and improved gait velocity to at least 1. 65 ft/sec    Baseline gait velocity  0.98 ft/ sec    Time 3    Period Weeks    Status New    Target Date 01/13/21      PT SHORT TERM GOAL #3   Title Pt low back pain will improve  to at least 4/10 with functional activities    Baseline 7/10    Time 3    Period Weeks    Status New    Target Date 01/13/21             PT Long Term Goals - 12/23/20 1058      PT LONG TERM GOAL #1   Title Independent with advanced HEP    Baseline no  knowledge    Time 8    Period Weeks    Status New    Target Date 02/17/21      PT LONG TERM GOAL #2   Title Pt will be able to rise from floor to mat independently using strategies for fall preparedness    Baseline unable and fearful of falling and getting on ground    Time 8    Period Weeks    Status New    Target Date 02/17/21      PT LONG TERM GOAL #3   Title . FOTO will improve from 40%intake   to 70%    indicating improved functional mobility.    Baseline eval 40%    Time 8    Period Weeks    Status New    Target Date 02/17/21       PT LONG TERM GOAL #4   Title Pt will demonstrate independent technique in order to rise and descend to the floor inorder to continue living safely at home alone    Baseline unable to do floor transfer    Time 8    Period Weeks    Status New    Target Date 02/17/21      PT LONG TERM GOAL #5   Title Pt will show improved LE strength by improving 5 x STS to 15 sec of less and show decrease risk of fall    Baseline 5 x STS 25.52 sec    Time 8    Period Weeks    Status New    Target Date 02/17/21                 Plan - 01/06/21 1110    Clinical Impression Statement Pt arrives carrying Small Base quad cane in right arm with purse over right shoulder. She reports no back pain today and demonstrates attempt to maintain upright posture when sitting on mat table.  Began Nustep and Continued stair training. REviewed HEP with mod cues required for pelvic tilit and trunk rotations. Gait training throughout clinic with [facility] however patient with poor carryover of sequencing. Instructed pt in gait with clinic [facility] and she was able to demonstrate much improved sequencing. She plans to ask her daughter for a SPC.    PT Next Visit Plan ,  gait with cane , Stairs  add mini  lunge to HEP and squat with weights , step ups with hand rail.  gait with cane    PT Home Exercise Plan [id number]           Patient will benefit from skilled therapeutic intervention in order to improve  the following deficits and impairments:  Pain,Obesity,Improper body mechanics,Postural dysfunction,Abnormal gait,Decreased activity tolerance,Decreased mobility,Decreased range of motion,Decreased strength,Impaired flexibility,Increased muscle spasms,Decreased endurance  Visit Diagnosis: Chronic bilateral low back pain without sciatica  Muscle weakness (generalized)  Other abnormalities of gait and mobility  Cramp and spasm     Problem List Patient Active Problem List   Diagnosis Date Noted  . Impaired fasting glucose  07/04/2016  . Depression, major, in remission (HCC) 11/17/2013  . GERD (gastroesophageal reflux disease) 05/14/2013  . Tobacco use disorder 04/24/2012  . Mixed hyperlipidemia 10/26/2011  . 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 Depressive disorder, not elsewhere classified 03/30/2011  . Benign essential tremor 02/27/2011  . Pure hypercholesterolemia 02/27/2011  . Migraine     Sherrie Mustache , [place] 01/06/2021, 11:17 AM  [facility] [address] [place], [place], [address] Phone: [phone]   Fax:  [phone]  Name: Lindsey Frank MRN: [id number] Date of Birth: Nov 08, 1945

## 2021-01-10 ENCOUNTER — Ambulatory Visit: Payer: Medicare HMO | Admitting: Physical Therapy

## 2021-01-17 ENCOUNTER — Other Ambulatory Visit: Payer: Self-pay

## 2021-01-17 ENCOUNTER — Encounter: Payer: Self-pay | Admitting: Physical Therapy

## 2021-01-17 ENCOUNTER — Ambulatory Visit: Payer: Medicare HMO | Attending: Family Medicine | Admitting: Physical Therapy

## 2021-01-17 DIAGNOSIS — R2689 Other abnormalities of gait and mobility: Secondary | ICD-10-CM | POA: Diagnosis not present

## 2021-01-17 DIAGNOSIS — M6281 Muscle weakness (generalized): Secondary | ICD-10-CM | POA: Insufficient documentation

## 2021-01-17 DIAGNOSIS — M545 Low back pain, unspecified: Secondary | ICD-10-CM | POA: Insufficient documentation

## 2021-01-17 DIAGNOSIS — G8929 Other chronic pain: Secondary | ICD-10-CM | POA: Diagnosis not present

## 2021-01-17 DIAGNOSIS — R252 Cramp and spasm: Secondary | ICD-10-CM

## 2021-01-17 NOTE — Therapy (Addendum)
[place], [place], [address] Phone: [phone]   Fax:  [phone]  Physical Therapy Treatment  Patient Details  Name: Lindsey Frank MRN: [id number] Date of Birth: 26-Oct-1945 Referring Provider (PT): Rita Ohara MD   Encounter Date: 01/17/2021   PT End of Session - 01/17/21 1017    Visit Number 4    Number of Visits 16    Date for PT Re-Evaluation 02/17/21    Authorization Type AEtna  ionto not covered    PT Start Time 1015    PT Stop Time 1100    PT Time Calculation (min) 45 min           Past Medical History:  Diagnosis Date  . Abnormal Pap smear of cervix 2019   HR HPV + recurrent   . Depression   . Elevated C-reactive protein (CRP) 01/2005  . Elevated cholesterol 01/2005  . Elevated triglycerides with high cholesterol 01/2005  . GERD (gastroesophageal reflux disease)   . History of endometriosis   . Migraine   . Tremor, essential     Past Surgical History:  Procedure Laterality Date  . BREAST LUMPECTOMY Right 1992   small lumpectomy for benign tumor  . CATARACT EXTRACTION Left 01/02/2018   Dr. Kathrin Penner  . COSMETIC SURGERY  2008   neck lift  . LAPAROSCOPY  1981   endometriosis  . TONSILECTOMY, ADENOIDECTOMY, BILATERAL MYRINGOTOMY AND TUBES  1953   age 53  . [place]   with appendectomy    There were no vitals filed for this visit.   Subjective Assessment - 01/17/21 1018    Subjective Still doing my exercises every morning. I got a SPC after my last visit.    Currently in Pain? No/denies                             [facility] Adult PT Treatment/Exercise - 01/17/21 0001      Ambulation/Gait   Ambulation Distance (Feet) 200 Feet    Assistive device Straight cane    Gait velocity 1.01 ft/sec    Stair Management Technique One rail Right;With cane    Number of Stairs 16   4 steps x 4 , reciprocal , god safety   Height of Stairs 6    Gait  Comments 132 ft in 2 min 10 sec =1.01 ft/sec      Lumbar Exercises: Stretches   Single Knee to Chest Stretch 5 reps;10 seconds;Right;Left    Lower Trunk Rotation 5 reps;20 seconds    Lower Trunk Rotation Limitations R and L      Lumbar Exercises: Aerobic   Nustep L3 x 6 minutes UE/LE      Lumbar Exercises: Standing   Other Standing Lumbar Exercises   Sit-stand X10 without UE Sit-stand with 5# KB at chest height x 10 mini forward lunge salternating using SPC in right hand and left UE on counter- SBA - good safety.      Lumbar Exercises: Supine   Pelvic Tilt 10 reps    Bridge  10 reps;3 seconds                    PT Short Term Goals - 01/17/21 1023      PT SHORT TERM GOAL #1   Title Independent with initial HEP    Baseline compliant with HEP and independent    Time 3  Period Weeks    Status Achieved      PT SHORT TERM GOAL #2   Title Pt will ambulate with LRAD and improved gait velocity to at least 1. 65 ft/sec    Baseline purchased SPC last week , working on gait with SPC    Time 3    Period Weeks    Status On-going      PT SHORT TERM GOAL #3   Title Pt low back pain will improve  to at least 4/10 with functional activities    Baseline back pain stil with household activities like changing sheets, up to 6/10    Time 3    Period Weeks    Status On-going             PT Long Term Goals - 12/23/20 1058      PT LONG TERM GOAL #1   Title Independent with advanced HEP    Baseline no  knowledge    Time 8    Period Weeks    Status New    Target Date 02/17/21      PT LONG TERM GOAL #2   Title Pt will be able to rise from floor to mat independently using strategies for fall preparedness    Baseline unable and fearful of falling and getting on ground    Time 8    Period Weeks    Status New    Target Date 02/17/21      PT LONG TERM GOAL #3   Title . FOTO will improve from 40%intake   to 70%    indicating improved functional mobility.    Baseline  eval 40%    Time 8    Period Weeks    Status New    Target Date 02/17/21      PT LONG TERM GOAL #4   Title Pt will demonstrate independent technique in order to rise and descend to the floor inorder to continue living safely at home alone    Baseline unable to do floor transfer    Time 8    Period Weeks    Status New    Target Date 02/17/21      PT LONG TERM GOAL #5   Title Pt will show improved LE strength by improving 5 x STS to 15 sec of less and show decrease risk of fall    Baseline 5 x STS 25.52 sec    Time 8    Period Weeks    Status New    Target Date 02/17/21                 Plan - 01/17/21 1026    Clinical Impression Statement Pt arrvies with SPC that she purchased after last visit. She is carrying her purse on right shoulder which was moved to the left shoulder to assist in cane sequencing. She is still very tentative and cautious with ambulation and uses intermittent step to pattern. She reaches for the wall or railing when it is close by. She improves her step pattern to step thorugh with cues. Reviewed partial lunges at sink with SPC and she performed with good safety. Added to HEP. Continued with sit-stands from mat and able to add 5# KB today with good tolerance- holding at chest level. She needs cues for posture and to look straight ahead once she is standing. She does reports her household activity pain level is reduced to 6/10 , improved from 7/10. She has met STG# 1  and is progressing toward her remaining STGs.    PT Next Visit Plan ,  gait with cane , Stairs review  mini  lunge,  continue  squat with weights , step ups with hand rail.  continue gait with cane, balance    PT Home Exercise Plan [id number]           Patient will benefit from skilled therapeutic intervention in order to improve the following deficits and impairments:  Pain,Obesity,Improper body mechanics,Postural dysfunction,Abnormal gait,Decreased activity tolerance,Decreased mobility,Decreased  range of motion,Decreased strength,Impaired flexibility,Increased muscle spasms,Decreased endurance  Visit Diagnosis: Chronic bilateral low back pain without sciatica  Muscle weakness (generalized)  Other abnormalities of gait and mobility  Cramp and spasm     Problem List Patient Active Problem List   Diagnosis Date Noted  . Impaired fasting glucose 07/04/2016  . Depression, major, in remission ([place]) 11/17/2013  . GERD (gastroesophageal reflux disease) 05/14/2013  . Tobacco use disorder 04/24/2012  . Mixed hyperlipidemia 10/26/2011  . 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 Depressive disorder, not elsewhere classified 03/30/2011  . Benign essential tremor 02/27/2011  . Pure hypercholesterolemia 02/27/2011  . Migraine     Dorene Ar, [place] 01/17/2021, 11:29 AM  [facility] [address] [place], [place], [address] Phone: [phone]   Fax:  [phone]  Name: Lindsey Frank MRN: [id number] Date of Birth: 12/17/1945

## 2021-01-24 ENCOUNTER — Other Ambulatory Visit: Payer: Self-pay

## 2021-01-24 ENCOUNTER — Encounter: Payer: Self-pay | Admitting: Physical Therapy

## 2021-01-24 ENCOUNTER — Ambulatory Visit: Payer: Medicare HMO | Admitting: Physical Therapy

## 2021-01-24 DIAGNOSIS — G8929 Other chronic pain: Secondary | ICD-10-CM | POA: Diagnosis not present

## 2021-01-24 DIAGNOSIS — R2689 Other abnormalities of gait and mobility: Secondary | ICD-10-CM

## 2021-01-24 DIAGNOSIS — M545 Low back pain, unspecified: Secondary | ICD-10-CM

## 2021-01-24 DIAGNOSIS — R252 Cramp and spasm: Secondary | ICD-10-CM | POA: Diagnosis not present

## 2021-01-24 DIAGNOSIS — M6281 Muscle weakness (generalized): Secondary | ICD-10-CM | POA: Diagnosis not present

## 2021-01-24 NOTE — Therapy (Addendum)
[facility] [address] [place], [place], [address] Phone: [phone]   Fax:  [phone]  Physical Therapy Treatment  Patient Details  Name: Lindsey Frank MRN: [id number] Date of Birth: 31-Aug-1946 Referring Provider (PT): [name] MD   Encounter Date: 01/24/2021   PT End of Session - 01/24/21 1226    Visit Number 5    Number of Visits 16    Date for PT Re-Evaluation 02/17/21    Authorization Type AEtna  ionto not covered    PT Start Time 1015    PT Stop Time 1103    PT Time Calculation (min) 48 min    Activity Tolerance Patient tolerated treatment well    Behavior During Therapy [facility] for tasks assessed/performed           Past Medical History:  Diagnosis Date  . Abnormal Pap smear of cervix 2019   HR HPV + recurrent   . Depression   . Elevated C-reactive protein (CRP) 01/2005  . Elevated cholesterol 01/2005  . Elevated triglycerides with high cholesterol 01/2005  . GERD (gastroesophageal reflux disease)   . History of endometriosis   . Migraine   . Tremor, essential     Past Surgical History:  Procedure Laterality Date  . BREAST LUMPECTOMY Right 1992   small lumpectomy for benign tumor  . CATARACT EXTRACTION Left 01/02/2018   Dr. Dagoberto Ligas  . COSMETIC SURGERY  2008   neck lift  . LAPAROSCOPY  1981   endometriosis  . TONSILECTOMY, ADENOIDECTOMY, BILATERAL MYRINGOTOMY AND TUBES  1953   age 66  . UTERINE FIBROID SURGERY  1977   with appendectomy    There were no vitals filed for this visit.   Subjective Assessment - 01/24/21 1019    Subjective Pt states she is doing good. Pt is having some pain on the R side of her neck but she attributes it to sleeping wrong. Pt states she has been able to do her ADL's with no pain.    Pain Score 2     Pain Location Neck    Pain Descriptors / Indicators Aching    Aggravating Factors  sleeping                             [facility] Adult PT  Treatment/Exercise - 01/24/21 0001      Ambulation/Gait   Ambulation Distance (Feet) 200 Feet    Assistive device Straight cane    Stair Management Technique One rail Right;With cane    Number of Stairs 8    Height of Stairs 6    Gait Comments frequent LOB with gait requiring min assist to prevent falls      Lumbar Exercises: Stretches   Single Knee to Chest Stretch 5 reps;10 seconds;Right;Left    Lower Trunk Rotation 5 reps;20 seconds    Lower Trunk Rotation Limitations R and L      Lumbar Exercises: Aerobic   Nustep L5 x 6 minutes UE/LE      Lumbar Exercises: Standing   Other Standing Lumbar Exercises mini forward lunge salternating using SPC in right hand and left UE on counter- SBA, Left and Right 10 reps      Lumbar Exercises: Supine   Pelvic Tilt 10 reps    Pelvic Tilt Limitations 5 sec hold    Bridge 10 reps;3 seconds   2 sets     Knee/Hip Exercises: Standing   Forward Step Up  Right;Left;Step Height: 6";Hand Hold: 1;15 reps    Other Standing Knee Exercises sit to stand 5# KB 10 reps      Manual Therapy   Manual Therapy Soft tissue mobilization    Soft tissue mobilization Trigger point release for R cervical/ upper traps                    PT Short Term Goals - 01/17/21 1023      PT SHORT TERM GOAL #1   Title Independent with initial HEP    Baseline compliant with HEP and independent    Time 3    Period Weeks    Status Achieved      PT SHORT TERM GOAL #2   Title Pt will ambulate with LRAD and improved gait velocity to at least 1. 65 ft/sec    Baseline purchased SPC last week , working on gait with SPC    Time 3    Period Weeks    Status On-going      PT SHORT TERM GOAL #3   Title Pt low back pain will improve  to at least 4/10 with functional activities    Baseline back pain stil with household activities like changing sheets, up to 6/10    Time 3    Period Weeks    Status On-going             PT Long Term Goals - 12/23/20 1058      PT  LONG TERM GOAL #1   Title Independent with advanced HEP    Baseline no  knowledge    Time 8    Period Weeks    Status New    Target Date 02/17/21      PT LONG TERM GOAL #2   Title Pt will be able to rise from floor to mat independently using strategies for fall preparedness    Baseline unable and fearful of falling and getting on ground    Time 8    Period Weeks    Status New    Target Date 02/17/21      PT LONG TERM GOAL #3   Title . FOTO will improve from 40%intake   to 70%    indicating improved functional mobility.    Baseline eval 40%    Time 8    Period Weeks    Status New    Target Date 02/17/21      PT LONG TERM GOAL #4   Title Pt will demonstrate independent technique in order to rise and descend to the floor inorder to continue living safely at home alone    Baseline unable to do floor transfer    Time 8    Period Weeks    Status New    Target Date 02/17/21      PT LONG TERM GOAL #5   Title Pt will show improved LE strength by improving 5 x STS to 15 sec of less and show decrease risk of fall    Baseline 5 x STS 25.52 sec    Time 8    Period Weeks    Status New    Target Date 02/17/21                 Plan - 01/24/21 1220    Clinical Impression Statement Pt arrived with SPC. She is still very tentative and cautious with ambulation and uses intermittent step pattern as well had frequent LOB requireing min assit to prevent falls.  Pt reports no falls since last visit. Continue with partial lunges at sink with SPC and she perfomed with SBA. Continued with sit-stands form mat and continued with 5# KB holding at chest level. Pt needs cues for posture and looking straight ahead while sitting and standing. Pt reports decreased pain with household activities. Pt tolerated treatment well and had decreased neck pain at the end of treatment. Walked pt to car following treatment due to frequent LOB during session. Recommended patient get rolling walker due to decreased  safety demonstrated in clinic today and due to her berg score taken on 12/28/20. (39/56) Pt states she will call her daughter today and ask her to get a RW.    PT Next Visit Plan did patient get a RW? Begin balance next visit, gait with RW, Stairs review mini  lunge, continue squat with weights, step ups with hand rail    PT Home Exercise Plan [id number]           Patient will benefit from skilled therapeutic intervention in order to improve the following deficits and impairments:  Pain,Obesity,Improper body mechanics,Postural dysfunction,Abnormal gait,Decreased activity tolerance,Decreased mobility,Decreased range of motion,Decreased strength,Impaired flexibility,Increased muscle spasms,Decreased endurance  Visit Diagnosis: Chronic bilateral low back pain without sciatica  Muscle weakness (generalized)  Other abnormalities of gait and mobility     Problem List Patient Active Problem List   Diagnosis Date Noted  . Impaired fasting glucose 07/04/2016  . Depression, major, in remission (HCC) 11/17/2013  . GERD (gastroesophageal reflux disease) 05/14/2013  . Tobacco use disorder 04/24/2012  . Mixed hyperlipidemia 10/26/2011  . 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 Depressive disorder, not elsewhere classified 03/30/2011  . Benign essential tremor 02/27/2011  . Pure hypercholesterolemia 02/27/2011  . Migraine     Dorthula Perfect, SPTA 01/24/2021, 1:10 PM  [facility] [address] [place], [place], [address] Phone: [phone]   Fax:  [phone]  Name: Lindsey Frank MRN: [id number] Date of Birth: 05-May-1946

## 2021-02-03 ENCOUNTER — Other Ambulatory Visit: Payer: Self-pay

## 2021-02-03 ENCOUNTER — Ambulatory Visit: Payer: Medicare HMO | Admitting: Physical Therapy

## 2021-02-03 DIAGNOSIS — G8929 Other chronic pain: Secondary | ICD-10-CM

## 2021-02-03 DIAGNOSIS — M545 Low back pain, unspecified: Secondary | ICD-10-CM | POA: Diagnosis not present

## 2021-02-03 DIAGNOSIS — R252 Cramp and spasm: Secondary | ICD-10-CM | POA: Diagnosis not present

## 2021-02-03 DIAGNOSIS — M6281 Muscle weakness (generalized): Secondary | ICD-10-CM | POA: Diagnosis not present

## 2021-02-03 DIAGNOSIS — R2689 Other abnormalities of gait and mobility: Secondary | ICD-10-CM | POA: Diagnosis not present

## 2021-02-03 NOTE — Therapy (Signed)
[facility]patient [facility] [address] [place], [place], [address] Phone: [phone]   Fax:  [phone]  Physical Therapy Treatment  Patient Details  Name: Lindsey Frank MRN: [id number] Date of Birth: 09/16/1946 Referring Provider (PT): [name] MD   Encounter Date: 02/03/2021   PT End of Session - 02/03/21 1019    Visit Number 6    Number of Visits 16    Date for PT Re-Evaluation 02/17/21    Authorization Type AEtna  ionto not covered    PT Start Time 1019    PT Stop Time 1100    PT Time Calculation (min) 41 min    Equipment Utilized During Treatment Gait belt    Activity Tolerance Patient tolerated treatment well    Behavior During Therapy WFL for tasks assessed/performed           Past Medical History:  Diagnosis Date  . Abnormal Pap smear of cervix 2019   HR HPV + recurrent   . Depression   . Elevated C-reactive protein (CRP) 01/2005  . Elevated cholesterol 01/2005  . Elevated triglycerides with high cholesterol 01/2005  . GERD (gastroesophageal reflux disease)   . History of endometriosis   . Migraine   . Tremor, essential     Past Surgical History:  Procedure Laterality Date  . BREAST LUMPECTOMY Right 1992   small lumpectomy for benign tumor  . CATARACT EXTRACTION Left 01/02/2018   Dr. Dagoberto Ligas  . COSMETIC SURGERY  2008   neck lift  . LAPAROSCOPY  1981   endometriosis  . TONSILECTOMY, ADENOIDECTOMY, BILATERAL MYRINGOTOMY AND TUBES  1953   age 89  . UTERINE FIBROID SURGERY  1977   with appendectomy    There were no vitals filed for this visit.   Subjective Assessment - 02/03/21 1252    Subjective Pt enters with cane and explains that  walkers are too expensive and she has been to 3 places but did not buy    Pertinent History essential tremors for years. GERD , depression, Hypercholesteremia,    Limitations House hold activities;Walking    Patient Stated Goals I want to be able to put on my bed sheet.     Currently in Pain? No/denies    Pain Score 0-No pain    Pain Location Neck              [facility] PT Assessment - 02/03/21 0001      Observation/Other Assessments   Focus on Therapeutic Outcomes (FOTO)  FOTO intake 59% predicted 70%      Standardized Balance Assessment   Standardized Balance Assessment Berg Balance Test      Berg Balance Test   Sit to Stand Able to stand without using hands and stabilize independently    Standing Unsupported Able to stand safely 2 minutes    Sitting with Back Unsupported but Feet Supported on Floor or Stool Able to sit safely and securely 2 minutes    Stand to Sit Sits safely with minimal use of hands    Transfers Able to transfer safely, minor use of hands    Standing Unsupported with Eyes Closed Able to stand 10 seconds with supervision   stands and sways   Standing Unsupported with Feet Together Able to place feet together independently and stand for 1 minute with supervision   S with gait belt   From Standing, Reach Forward with Outstretched Arm Can reach confidently >25 cm (10")    From Standing Position, Pick up  Object from Floor Able to pick up shoe safely and easily    From Standing Position, Turn to Look Behind Over each Shoulder Looks behind from both sides and weight shifts well    Turn 360 Degrees Able to turn 360 degrees safely one side only in 4 seconds or less    Standing Unsupported, Alternately Place Feet on Step/Stool Able to stand independently and safely and complete 8 steps in 20 seconds    Standing Unsupported, One Foot in [employer] balance while stepping or standing    Standing on One Leg Tries to lift leg/unable to hold 3 seconds but remains standing independently    Total Score 46    Berg comment: 46/56  must use AD at all times                         [facility] Adult PT Treatment/Exercise - 02/03/21 0001      Ambulation/Gait   Ambulation Distance (Feet) 400 Feet    Assistive device Rolling walker;Straight  cane    Gait velocity 1.61ft/sec    Stair Management Technique With cane;No rails    Number of Stairs 8    Height of Stairs 6    Gait Comments Pt tried walker for 200 feet and then continued with cane for 200 feet with VC      Knee/Hip Exercises: Standing   Forward Step Up Right;Left;Step Height: 6";Hand Hold: 1;15 reps    Forward Step Up Limitations simulate to use bed post with step at home    Other Standing Knee Exercises lunge with UE hold on chair and cane in R UE with ther ex pad/pillow for knee touch  10 x on R and 10 X on L with supervision and gait belt,, reverselunge step x 5 on L and x 5 on R    Other Standing Knee Exercises sit to stand 10# KB 10 reps                  PT Education - 02/03/21 1104    Education Details Re test of BERG and            PT Short Term Goals - 02/03/21 1251      PT SHORT TERM GOAL #1   Title Independent with initial HEP    Baseline compliant with HEP and independent    Time 3    Period Weeks    Status Achieved    Target Date 01/13/21      PT SHORT TERM GOAL #2   Title Pt will ambulate with LRAD and improved gait velocity to at least 1. 65 ft/sec    Baseline with cane   1.98FT/sec    Time 3    Period Weeks    Status Achieved             PT Long Term Goals - 12/23/20 1058      PT LONG TERM GOAL #1   Title Independent with advanced HEP    Baseline no  knowledge    Time 8    Period Weeks    Status New    Target Date 02/17/21      PT LONG TERM GOAL #2   Title Pt will be able to rise from floor to mat independently using strategies for fall preparedness    Baseline unable and fearful of falling and getting on ground    Time 8    Period Weeks  Status New    Target Date 02/17/21      PT LONG TERM GOAL #3   Title . FOTO will improve from 40%intake   to 70%    indicating improved functional mobility.    Baseline eval 40%    Time 8    Period Weeks    Status New    Target Date 02/17/21      PT LONG TERM GOAL #4    Title Pt will demonstrate independent technique in order to rise and descend to the floor inorder to continue living safely at home alone    Baseline unable to do floor transfer    Time 8    Period Weeks    Status New    Target Date 02/17/21      PT LONG TERM GOAL #5   Title Pt will show improved LE strength by improving 5 x STS to 15 sec of less and show decrease risk of fall    Baseline 5 x STS 25.52 sec    Time 8    Period Weeks    Status New    Target Date 02/17/21                 Plan - 02/03/21 1044    Clinical Impression Statement Pt arrived with SPC.  and was asked about using /finding a walker for safety. Pt complained that the walker cost $100.  Pt retook BERG today with improvement form 39 to 46/56 but still requiring an AD.  Pt told walker was safest and most sturdy but pt admitted she will not use the walker at home.  Pt increasing her sit to stand KB to 10# to increase strength. Pt given standing forward step up exercises for increasing functional strength.  PT / SPTA Student emphasized benefit of rolling walker on days she is having more difficulty and that she would still benefit from owning on.  Ms Adline PotterMastbrook stated she is seeing her MD next week and would ask for an order. Pt continues to walk with head bent forward and with limited visual scanning position. will continue to strengthen / falls prevention    Personal Factors and Comorbidities Age;Comorbidity 1;Comorbidity 2    Comorbidities essential tremors for years. GERD , depression, Hypercholesteremia, see med history    Examination-Activity Limitations Bend;Dressing;Hygiene/Grooming;Lift;Locomotion Level;Reach Overhead;Stand;Stairs;Squat    Examination-Participation Restrictions Yard Work    PT Frequency 2x / week    PT Duration 8 weeks    PT Treatment/Interventions ADLs/Self Care Home Management;Aquatic Therapy;Cryotherapy;Physicist, medicallectrical Stimulation;Moist Heat;Ultrasound;Balance training;Therapeutic  exercise;Therapeutic activities;Functional mobility training;Stair training;Gait training;Neuromuscular re-education;Patient/family education;Passive range of motion;Manual techniques;Dry needling;Taping;Spinal Manipulations;Joint Manipulations    PT Next Visit Plan BERG improved 46/56 but still needs a cane/  tried walker and stride length better but pt unsure if she would use at home even though advised to still ask doctor for RW.  Begin balance next visit, gait with RW, Stairs review mini  lunge, continue squat with weights, step ups with hand rail. go over FOTO report visit # 7  Will benefit from floor to stand x fer training.    PT Home Exercise Plan [id number]    Consulted and Agree with Plan of Care Patient           Patient will benefit from skilled therapeutic intervention in order to improve the following deficits and impairments:  Pain,Obesity,Improper body mechanics,Postural dysfunction,Abnormal gait,Decreased activity tolerance,Decreased mobility,Decreased range of motion,Decreased strength,Impaired flexibility,Increased muscle spasms,Decreased endurance  Visit Diagnosis: Chronic bilateral low back  pain without sciatica  Muscle weakness (generalized)  Other abnormalities of gait and mobility  Cramp and spasm     Problem List Patient Active Problem List   Diagnosis Date Noted  . Impaired fasting glucose 07/04/2016  . Depression, major, in remission (HCC) 11/17/2013  . GERD (gastroesophageal reflux disease) 05/14/2013  . Tobacco use disorder 04/24/2012  . Mixed hyperlipidemia 10/26/2011  . 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 Depressive disorder, not elsewhere classified 03/30/2011  . Benign essential tremor 02/27/2011  . Pure hypercholesterolemia 02/27/2011  . Migraine    Garen Lah, PT, [facility] Certified Exercise Expert for the Aging Adult  02/03/21 12:55 PM Phone: [phone] Fax: [phone]  [facility] [address] [place], [place], [address] Phone: [phone]   Fax:  [phone]  Name: VERDINE GRENFELL MRN: [id number] Date of Birth: 06-23-1946

## 2021-02-03 NOTE — Patient Instructions (Signed)
   Garen Lah, PT, [facility] Certified Exercise Expert for the Aging Adult  02/03/21 10:56 AM Phone: [phone] Fax: [phone]

## 2021-02-08 ENCOUNTER — Encounter: Payer: Self-pay | Admitting: Physical Therapy

## 2021-02-08 ENCOUNTER — Other Ambulatory Visit: Payer: Self-pay

## 2021-02-08 ENCOUNTER — Ambulatory Visit: Payer: Medicare HMO | Admitting: Physical Therapy

## 2021-02-08 DIAGNOSIS — M6281 Muscle weakness (generalized): Secondary | ICD-10-CM | POA: Diagnosis not present

## 2021-02-08 DIAGNOSIS — G8929 Other chronic pain: Secondary | ICD-10-CM | POA: Diagnosis not present

## 2021-02-08 DIAGNOSIS — M545 Low back pain, unspecified: Secondary | ICD-10-CM

## 2021-02-08 DIAGNOSIS — R2689 Other abnormalities of gait and mobility: Secondary | ICD-10-CM | POA: Diagnosis not present

## 2021-02-08 DIAGNOSIS — R252 Cramp and spasm: Secondary | ICD-10-CM

## 2021-02-08 NOTE — Therapy (Signed)
[facility] Outpatient Rehabilitation [facility] [address] [place], [place], [address] Phone: [phone]   Fax:  [phone]  Physical Therapy Treatment  Patient Details  Name: Lindsey Frank MRN: [id number] Date of Birth: 01/22/1946 Referring Provider (PT): [name] MD   Encounter Date: 02/08/2021   PT End of Session - 02/08/21 1201    Visit Number 7    Number of Visits 16    Date for PT Re-Evaluation 02/17/21    Authorization Type AEtna  ionto not covered    PT Start Time 1015    PT Stop Time 1105    PT Time Calculation (min) 50 min    Equipment Utilized During Treatment Gait belt    Activity Tolerance Patient tolerated treatment well    Behavior During Therapy [facility] for tasks assessed/performed           Past Medical History:  Diagnosis Date  . Abnormal Pap smear of cervix 2019   HR HPV + recurrent   . Depression   . Elevated C-reactive protein (CRP) 01/2005  . Elevated cholesterol 01/2005  . Elevated triglycerides with high cholesterol 01/2005  . GERD (gastroesophageal reflux disease)   . History of endometriosis   . Migraine   . Tremor, essential     Past Surgical History:  Procedure Laterality Date  . BREAST LUMPECTOMY Right 1992   small lumpectomy for benign tumor  . CATARACT EXTRACTION Left 01/02/2018   Dr. Dagoberto Ligas  . COSMETIC SURGERY  2008   neck lift  . LAPAROSCOPY  1981   endometriosis  . TONSILECTOMY, ADENOIDECTOMY, BILATERAL MYRINGOTOMY AND TUBES  1953   age 10  . UTERINE FIBROID SURGERY  1977   with appendectomy    There were no vitals filed for this visit.   Subjective Assessment - 02/08/21 1146    Subjective Pt has been doing well. She has not asked for a referal for a RW from her doctor but is planning on calling her MD to ask for one.    Pain Score 0-No pain    Pain Location Back              [facility] PT Assessment - 02/08/21 0001      Berg Balance Test   Sit to Stand Able to stand without using hands and  stabilize independently    Standing Unsupported Able to stand safely 2 minutes    Sitting with Back Unsupported but Feet Supported on Floor or Stool Able to sit safely and securely 2 minutes    Stand to Sit Sits safely with minimal use of hands    Transfers Able to transfer safely, minor use of hands    Standing Unsupported with Eyes Closed Able to stand 10 seconds with supervision    Standing Unsupported with Feet Together Able to place feet together independently and stand for 1 minute with supervision    From Standing, Reach Forward with Outstretched Arm Can reach confidently >25 cm (10")    From Standing Position, Pick up Object from Floor Able to pick up shoe, needs supervision    From Standing Position, Turn to Look Behind Over each Shoulder Looks behind from both sides and weight shifts well    Turn 360 Degrees Able to turn 360 degrees safely but slowly    Standing Unsupported, Alternately Place Feet on Step/Stool Able to stand independently and safely and complete 8 steps in 20 seconds    Standing Unsupported, One Foot in Front Able to take small step  independently and hold 30 seconds    Standing on One Leg Able to lift leg independently and hold equal to or more than 3 seconds    Total Score 47    Berg comment: 47/56  must use AD at all times                         [facility] Adult PT Treatment/Exercise - 02/08/21 0001      Balance   Balance Assessed Yes      Standardized Balance Assessment   Standardized Balance Assessment --   SLS at counter with 2 finger touch 15 sec 2x Left/Right, Tandem stance at counter 2 finger touch 30 sec 2x leading with L and R, Standing on airex at counter feet close 13 sec max w multiple trials, and feet apart looking in all directions.     Lumbar Exercises: Standing   Other Standing Lumbar Exercises lunge using chair in right hand and left UE on counter- min assist, Left and Right 5 reps   knee down to airex pad                    PT Short Term Goals - 02/03/21 1251      PT SHORT TERM GOAL #1   Title Independent with initial HEP    Baseline compliant with HEP and independent    Time 3    Period Weeks    Status Achieved    Target Date 01/13/21      PT SHORT TERM GOAL #2   Title Pt will ambulate with LRAD and improved gait velocity to at least 1. 65 ft/sec    Baseline with cane   1.98FT/sec    Time 3    Period Weeks    Status Achieved             PT Long Term Goals - 12/23/20 1058      PT LONG TERM GOAL #1   Title Independent with advanced HEP    Baseline no  knowledge    Time 8    Period Weeks    Status New    Target Date 02/17/21      PT LONG TERM GOAL #2   Title Pt will be able to rise from floor to mat independently using strategies for fall preparedness    Baseline unable and fearful of falling and getting on ground    Time 8    Period Weeks    Status New    Target Date 02/17/21      PT LONG TERM GOAL #3   Title . FOTO will improve from 40%intake   to 70%    indicating improved functional mobility.    Baseline eval 40%    Time 8    Period Weeks    Status New    Target Date 02/17/21      PT LONG TERM GOAL #4   Title Pt will demonstrate independent technique in order to rise and descend to the floor inorder to continue living safely at home alone    Baseline unable to do floor transfer    Time 8    Period Weeks    Status New    Target Date 02/17/21      PT LONG TERM GOAL #5   Title Pt will show improved LE strength by improving 5 x STS to 15 sec of less and show decrease risk of fall    Baseline  5 x STS 25.52 sec    Time 8    Period Weeks    Status New    Target Date 02/17/21                 Plan - 02/08/21 1152    Clinical Impression Statement Pt arrived with SPC ith some LOB walking back to treatment area. Pt retook BERG with improvement from 46 to 47/56 but still requiring AD. Additional balance exercises were performed based on activities  that were difficult during BERG. Discussed with pt that balance exercises are to help with improving balance and safety. Completed lunges down to airex pad with counter and chair as support to begin floor transfers. Pt was walked to car following treatment due to LOB during treatment.    PT Next Visit Plan continue balance next visit, gait with RW, Stairs review, lunges, continue squat with weights, step ups with hand rail, Will benefit from floor to stand x fer training.    PT Home Exercise Plan [id number]           Patient will benefit from skilled therapeutic intervention in order to improve the following deficits and impairments:  Pain,Obesity,Improper body mechanics,Postural dysfunction,Abnormal gait,Decreased activity tolerance,Decreased mobility,Decreased range of motion,Decreased strength,Impaired flexibility,Increased muscle spasms,Decreased endurance  Visit Diagnosis: Chronic bilateral low back pain without sciatica  Muscle weakness (generalized)  Other abnormalities of gait and mobility  Cramp and spasm     Problem List Patient Active Problem List   Diagnosis Date Noted  . Impaired fasting glucose 07/04/2016  . Depression, major, in remission (HCC) 11/17/2013  . GERD (gastroesophageal reflux disease) 05/14/2013  . Tobacco use disorder 04/24/2012  . Mixed hyperlipidemia 10/26/2011  . 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 Depressive disorder, not elsewhere classified 03/30/2011  . Benign essential tremor 02/27/2011  . Pure hypercholesterolemia 02/27/2011  . Migraine     Dorthula Perfect, SPTA 02/08/2021, 12:05 PM  [facility] [address] [place], [place], [address] Phone: [phone]   Fax:  [phone]  Name: Lindsey Frank MRN: [id number] Date of Birth: May 04, 1946

## 2021-03-01 ENCOUNTER — Other Ambulatory Visit: Payer: Self-pay

## 2021-03-01 ENCOUNTER — Ambulatory Visit: Payer: Medicare HMO | Attending: Family Medicine | Admitting: Physical Therapy

## 2021-03-01 DIAGNOSIS — G8929 Other chronic pain: Secondary | ICD-10-CM | POA: Insufficient documentation

## 2021-03-01 DIAGNOSIS — R2689 Other abnormalities of gait and mobility: Secondary | ICD-10-CM | POA: Insufficient documentation

## 2021-03-01 DIAGNOSIS — M6281 Muscle weakness (generalized): Secondary | ICD-10-CM

## 2021-03-01 DIAGNOSIS — R2681 Unsteadiness on feet: Secondary | ICD-10-CM

## 2021-03-01 DIAGNOSIS — R252 Cramp and spasm: Secondary | ICD-10-CM | POA: Diagnosis not present

## 2021-03-01 DIAGNOSIS — M545 Low back pain, unspecified: Secondary | ICD-10-CM | POA: Diagnosis not present

## 2021-03-01 NOTE — Patient Instructions (Signed)
Trigger Point Dry Needling  . What is Trigger Point Dry Needling (DN)? o DN is a physical therapy technique used to treat muscle pain and dysfunction. Specifically, DN helps deactivate muscle trigger points (muscle knots).  o A thin filiform needle is used to penetrate the skin and stimulate the underlying trigger point. The goal is for a local twitch response (LTR) to occur and for the trigger point to relax. No medication of any kind is injected during the procedure.   . What Does Trigger Point Dry Needling Feel Like?  o The procedure feels different for each individual patient. Some patients report that they do not actually feel the needle enter the skin and overall the process is not painful. Very mild bleeding may occur. However, many patients feel a deep cramping in the muscle in which the needle was inserted. This is the local twitch response.   Marland Kitchen How Will I feel after the treatment? o Soreness is normal, and the onset of soreness may not occur for a few hours. Typically this soreness does not last longer than two days.  o Bruising is uncommon, however; ice can be used to decrease any possible bruising.  o In rare cases feeling tired or nauseous after the treatment is normal. In addition, your symptoms may get worse before they get better, this period will typically not last longer than 24 hours.   . What Can I do After My Treatment? o Increase your hydration by drinking more water for the next 24 hours. o You may place ice or heat on the areas treated that have become sore, however, do not use heat on inflamed or bruised areas. Heat often brings more relief post needling. o You can continue your regular activities, but vigorous activity is not recommended initially after the treatment for 24 hours. o DN is best combined with other physical therapy such as strengthening, stretching, and other therapies.   Garen Lah, PT, [facility] Certified Exercise Expert for the Aging Adult  03/01/21  11:36 AM Phone: [phone] Fax: [phone]

## 2021-03-01 NOTE — Therapy (Signed)
[facility] Outpatient Rehabilitation [facility] [address] [place], [place], [address] Phone: [phone]   Fax:  [phone]  Physical Therapy Treatment/ERO  Patient Details  Name: Lindsey Frank MRN: [id number] Date of Birth: 1946-07-27 Referring Provider (PT): [name] MD   Encounter Date: 03/01/2021   PT End of Session - 03/01/21 1102    Visit Number 8    Number of Visits 15    Date for PT Re-Evaluation 04/14/21    Authorization Type AEtna  ionto not covered    PT Start Time 1103    PT Stop Time 1145    PT Time Calculation (min) 42 min    Equipment Utilized During Treatment Gait belt    Activity Tolerance Patient tolerated treatment well    Behavior During Therapy WFL for tasks assessed/performed           Past Medical History:  Diagnosis Date  . Abnormal Pap smear of cervix 2019   HR HPV + recurrent   . Depression   . Elevated C-reactive protein (CRP) 01/2005  . Elevated cholesterol 01/2005  . Elevated triglycerides with high cholesterol 01/2005  . GERD (gastroesophageal reflux disease)   . History of endometriosis   . Migraine   . Tremor, essential     Past Surgical History:  Procedure Laterality Date  . BREAST LUMPECTOMY Right 1992   small lumpectomy for benign tumor  . CATARACT EXTRACTION Left 01/02/2018   Dr. Dagoberto Ligas  . COSMETIC SURGERY  2008   neck lift  . LAPAROSCOPY  1981   endometriosis  . TONSILECTOMY, ADENOIDECTOMY, BILATERAL MYRINGOTOMY AND TUBES  1953   age 41  . UTERINE FIBROID SURGERY  1977   with appendectomy    There were no vitals filed for this visit.   Subjective Assessment - 03/01/21 1109    Subjective My back is really bothering me in the middle and further up my back on mid back and further up on R side.  I did have a tumble when I was sitting on toilet and leaned over to get up with laundry basket in front of me in bathroom and I fell over into the laundry about a week ago. I have no problems sleeping now     Pertinent History essential tremors for years. GERD , depression, Hypercholesteremia,    Limitations House hold activities;Walking    How long can you stand comfortably? 5 minutes    How long can you walk comfortably? I still dont feel steady and I use my cane only when I go out. I dont use it in the house    Diagnostic tests none recently    Patient Stated Goals I want to be able to put on my bed sheet.    Currently in Pain? Yes    Pain Score 7     Pain Location Back    Pain Orientation Right;Left;Mid    Pain Descriptors / Indicators Aching    Pain Type Chronic pain    Pain Onset More than a month ago    Pain Frequency Constant              [facility] PT Assessment - 03/01/21 0001      Assessment   Medical Diagnosis chronic bil back pain without sciatica    Referring Provider (PT) [name] MD    Onset Date/Surgical Date 12/21/20   acute on chronic after fall 12-21-20   Hand Dominance Right      Precautions   Precautions Fall  Precaution Comments Pt is not consistent with use of cane/refuses walker,      Balance Screen   Has the patient fallen in the past 6 months Yes    How many times? 2   12-21-20 fall at grocery and 02-22-21 in bathroom   Has the patient had a decrease in activity level because of a fear of falling?  No    Is the patient reluctant to leave their home because of a fear of falling?  No      Home Environment   Living Environment Private residence    Living Arrangements Alone    Type of Home House    Home Access Level entry    Home Layout One level      Prior Function   Level of Independence Independent    Vocation Retired    Leisure Runner, broadcasting/film/videoancestry researcher      Cognition   Overall Cognitive Status Within Functional Limits for tasks assessed      Observation/Other Assessments   Focus on Therapeutic Outcomes (FOTO)  FOTO intake 40% predicted 70%  Intake 03-01-21 48%      Functional Tests   Functional tests Sit to Stand      Sit to Stand   Comments  19.81sec 5 x STS      Posture/Postural Control   Posture/Postural Control Postural limitations    Postural Limitations Increased thoracic kyphosis;Rounded Shoulders;Forward head;Flexed trunk    Posture Comments 14 cm Occiput to wall  Scoliosis r convex      AROM   Overall AROM Comments Supervision for safety    Right Hip Flexion 115    Right Hip External Rotation  32    Right Hip Internal Rotation  25    Left Hip Flexion 110    Left Hip External Rotation  36    Left Hip Internal Rotation  23    Lumbar Flexion 90   able to touch floor CGA for safety   Lumbar Extension 30    Lumbar - Right Side Bend 20    Lumbar - Left Side Bend 18    Lumbar - Right Rotation 50%    Lumbar - Left Rotation 50%      Strength   Overall Strength Deficits    Right Hip Flexion 4/5    Right Hip Extension 4-/5    Right Hip ABduction 3/5    Left Hip Flexion 4-/5    Left Hip Extension 4-/5    Left Hip ABduction 4-/5    Right Knee Flexion 4/5    Right Knee Extension 4-/5   pain with bruise on knee after fall   Left Knee Flexion 4/5    Left Knee Extension 4/5    Right Ankle Dorsiflexion 4/5    Right Ankle Plantar Flexion 3/5    Left Ankle Dorsiflexion 4/5    Left Ankle Plantar Flexion 3+/5      Ambulation/Gait   Ambulation Distance (Feet) 200 Feet    Assistive device Straight cane    Gait Pattern Decreased trunk rotation;Poor foot clearance - left;Poor foot clearance - right;Decreased hip/knee flexion - right;Decreased hip/knee flexion - left    Gait velocity 1.79 ft/sec    Stair Management Technique --    Number of Stairs --    Height of Stairs --    Gait Comments unsteadiness with gait requiring min assist to prevent falls  [facility] Adult PT Treatment/Exercise - 03/01/21 0001      Therapeutic Activites    Therapeutic Activities Other Therapeutic Activities    Other Therapeutic Activities min assist with floor to mat and mat to floor transfers       Modalities   Modalities Moist Heat      Moist Heat Therapy   Number Minutes Moist Heat 15 Minutes    Moist Heat Location Lumbar Spine      Manual Therapy   Manual Therapy Soft tissue mobilization;Myofascial release    Manual therapy comments skilled palpation with TPDN    Soft tissue mobilization bil buttocks and QL    Myofascial Release QL bil            Trigger Point Dry Needling - 03/01/21 0001    Consent Given? Yes    Education Handout Provided Yes    Muscles Treated Back/Hip Quadratus lumborum   bil   Dry Needling Comments 56mm .30 gage    Quadratus Lumborum Response Twitch response elicited;Palpable increased muscle length                  PT Short Term Goals - 02/03/21 1251      PT SHORT TERM GOAL #1   Title Independent with initial HEP    Baseline compliant with HEP and independent    Time 3    Period Weeks    Status Achieved    Target Date 01/13/21      PT SHORT TERM GOAL #2   Title Pt will ambulate with LRAD and improved gait velocity to at least 1. 65 ft/sec    Baseline with cane   1.98FT/sec    Time 3    Period Weeks    Status Achieved             PT Long Term Goals - 03/01/21 1113      PT LONG TERM GOAL #1   Title Independent with advanced HEP    Baseline Pt needs mod cuing of exercises, Pt reports not doing exercises at home    Time 8    Period Weeks    Status On-going    Target Date 04/12/21      PT LONG TERM GOAL #2   Title Pt will be able to rise from floor to mat independently using strategies for fall preparedness    Baseline Pt requires min assist to rise from floor with use of UE on mat in quadripet    Time 6    Period Weeks    Status On-going    Target Date 04/12/21      PT LONG TERM GOAL #3   Title FOTO will improve from 40%intake   to 70%    indicating improved functional mobility.    Baseline eval 40%  03-01-21 48%    Time 6    Period Weeks    Status On-going    Target Date 04/12/21      PT LONG TERM GOAL #4    Title Pt will be able to stand for 20-30 minutes to complete household task    Baseline 02-24-21 pt reports cannot stand greater than 5 minute today    Time 6    Period Weeks    Status On-going    Target Date 04/12/21      PT LONG TERM GOAL #5   Title Pt will show improved LE strength by improving 5 x STS to 15 sec of less and show decrease risk  of fall    Baseline 5 x STS 25.52 sec  eval   03-01-21 5 x sts 19.81 sec    Time 6    Period Weeks    Status On-going    Target Date 04/12/21                 Plan - 03/01/21 1555    Clinical Impression Statement Pt presents to clinic with [facility] and 1.79 ft/sec gait and unsteadiness on feet.  Pt reports a fall last week in the bathroom as she was sitting on toilet and leaning over laundry and fell into her laundry basket. She reports being unharmed but clearly has balance and weakness in LE.  5 x STS improved from original eval of 19.81 but still at risk of fall being over 13.0 sec.  Pt honestly reports she does not do exercises at home but recognizes she does need to be stronger in order to continue living alone and care for herself.  Pt wants to have limited visits to PT but agrees to work on an HEP in next 6 weeks to help her  remain independent.  Pt will benefit from additional 6 weeks to reiniforce HEP and help pt be able to rise from floor to standing independently.  Pt did demonstrate rising from floot with min assist but should be able to increase strength withHEP reinforcementpt will benefit from skilled PT to address impairments listed to reinforce fall prevention and increase functional strength and balance    Personal Factors and Comorbidities Age;Comorbidity 1;Comorbidity 2    Comorbidities essential tremors for years. GERD , depression, Hypercholesteremia, see med history    Examination-Activity Limitations Bend;Dressing;Hygiene/Grooming;Lift;Locomotion Level;Reach Overhead;Stand;Stairs;Squat;Other   rising from floor    Examination-Participation Restrictions Yard Work    Stability/Clinical Decision Making Evolving/Moderate complexity    Clinical Decision Making Moderate    Rehab Potential Good    PT Frequency 1x / week    PT Duration 6 weeks    PT Treatment/Interventions ADLs/Self Care Home Management;Aquatic Therapy;Cryotherapy;Physicist, medical;Therapeutic exercise;Therapeutic activities;Functional mobility training;Stair training;Gait training;Neuromuscular re-education;Patient/family education;Passive range of motion;Manual techniques;Dry needling;Taping;Spinal Manipulations;Joint Manipulations    PT Next Visit Plan continue balance next visit, gait with RW, Stairs review, lunges, continue squat with weights, step ups with hand rail, Will benefit from floor to stand x fer training. Pt being seen 1 x a week for 6 weeks to increase strength and ability to rise from ground and fall prevention    PT Home Exercise Plan [email or web address]    Consulted and Agree with Plan of Care Patient           Patient will benefit from skilled therapeutic intervention in order to improve the following deficits and impairments:  Pain,Obesity,Improper body mechanics,Postural dysfunction,Abnormal gait,Decreased activity tolerance,Decreased mobility,Decreased range of motion,Decreased strength,Impaired flexibility,Increased muscle spasms,Decreased endurance  Visit Diagnosis: Chronic bilateral low back pain without sciatica  Muscle weakness (generalized)  Other abnormalities of gait and mobility  Cramp and spasm  Unsteadiness on feet     Problem List Patient Active Problem List   Diagnosis Date Noted  . Impaired fasting glucose 07/04/2016  . Depression, major, in remission (HCC) 11/17/2013  . GERD (gastroesophageal reflux disease) 05/14/2013  . Tobacco use disorder 04/24/2012  . Mixed hyperlipidemia 10/26/2011  . 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 Depressive disorder, not  elsewhere classified 03/30/2011  . Benign essential tremor 02/27/2011  . Pure hypercholesterolemia 02/27/2011  . Migraine    Garen Lah, PT, [facility] Certified Exercise Expert for the  Aging Adult  03/01/21 4:08 PM Phone: [phone] Fax: [phone]  [facility] Outpatient Rehabilitation [facility] [address] [place], [place], [address] Phone: [phone]   Fax:  [phone]  Name: Lindsey Frank MRN: [id number] Date of Birth: [birth year]

## 2021-03-03 ENCOUNTER — Telehealth: Payer: Self-pay | Admitting: Physical Therapy

## 2021-03-03 NOTE — Telephone Encounter (Signed)
ERROR

## 2021-03-05 ENCOUNTER — Other Ambulatory Visit: Payer: Self-pay | Admitting: Family Medicine

## 2021-03-05 DIAGNOSIS — G250 Essential tremor: Secondary | ICD-10-CM

## 2021-03-07 ENCOUNTER — Ambulatory Visit: Payer: Medicare HMO | Admitting: Physical Therapy

## 2021-03-07 NOTE — Telephone Encounter (Signed)
Left message for patient with the Dr.Knapp's response and suggestions for alternative medications. Asked her to please call back.

## 2021-03-07 NOTE — Telephone Encounter (Signed)
No.  She isn't on this for hypertension, she is on this for her tremor.  If the extended release propranolol is too expensive, we can switch to short-acting propranolol (20mg  TID, can titrate further if needed), or we can try atenolol.  These suggestions aren't appropriate for tremor. Info from UpToDate:  Other beta blockers--Propranolol is the best-studied beta blocker for ET, but several other beta blockers, mainly nonselective agents, also have anti-tremor effects based on more limited evidence.  For nonselective agents like sotalol and nadolol, in most instances there is no advantage to using these drugs for ET, since the evidence is stronger for propranolol. However, nadolol may still have a role, as it is a largely peripheral-acting beta blocker and may avoid adverse central effects that are sometimes induced by propranolol, such as depression [3,9,21-23]. Among beta-1 selective agents, atenolol is probably the best alternative to propranolol for patients with a relative contraindication to a nonselective beta blocker (eg, asthma or chronic obstructive lung disease). However, beta-1 selectivity is not absolute and may diminish at higher doses (eg, >100 mg daily) (see "Major side effects of beta blockers"). Metoprolol is of uncertain benefit for ET.

## 2021-03-07 NOTE — Telephone Encounter (Signed)
Is this okay?

## 2021-03-15 ENCOUNTER — Ambulatory Visit: Payer: Medicare HMO | Admitting: Physical Therapy

## 2021-03-15 ENCOUNTER — Other Ambulatory Visit: Payer: Self-pay

## 2021-03-15 ENCOUNTER — Encounter: Payer: Self-pay | Admitting: Physical Therapy

## 2021-03-15 DIAGNOSIS — M545 Low back pain, unspecified: Secondary | ICD-10-CM | POA: Diagnosis not present

## 2021-03-15 DIAGNOSIS — R252 Cramp and spasm: Secondary | ICD-10-CM

## 2021-03-15 DIAGNOSIS — M6281 Muscle weakness (generalized): Secondary | ICD-10-CM

## 2021-03-15 DIAGNOSIS — R2689 Other abnormalities of gait and mobility: Secondary | ICD-10-CM | POA: Diagnosis not present

## 2021-03-15 DIAGNOSIS — G8929 Other chronic pain: Secondary | ICD-10-CM

## 2021-03-15 DIAGNOSIS — R2681 Unsteadiness on feet: Secondary | ICD-10-CM

## 2021-03-15 NOTE — Therapy (Signed)
[facility] [address] [place], [place], [address] Phone: [phone]   Fax:  [phone]  Physical Therapy Treatment  Patient Details  Name: Lindsey Frank MRN: [id number] Date of Birth: [birth year] Referring Provider (PT): Joselyn ArrowKnapp, Eve MD   Encounter Date: 03/15/2021   PT End of Session - 03/15/21 1727    Visit Number 9    Number of Visits 15    Date for PT Re-Evaluation 04/14/21    Authorization Type AEtna  ionto not covered    PT Start Time 1018    PT Stop Time 1100    PT Time Calculation (min) 42 min    Equipment Utilized During Treatment Gait belt    Activity Tolerance Patient tolerated treatment well    Behavior During Therapy [facility] for tasks assessed/performed           Past Medical History:  Diagnosis Date  . Abnormal Pap smear of cervix 2019   HR HPV + recurrent   . Depression   . Elevated C-reactive protein (CRP) 01/2005  . Elevated cholesterol 01/2005  . Elevated triglycerides with high cholesterol 01/2005  . GERD (gastroesophageal reflux disease)   . History of endometriosis   . Migraine   . Tremor, essential     Past Surgical History:  Procedure Laterality Date  . BREAST LUMPECTOMY Right 1992   small lumpectomy for benign tumor  . CATARACT EXTRACTION Left 01/02/2018   Dr. Dagoberto LigasStoneburner  . COSMETIC SURGERY  2008   neck lift  . LAPAROSCOPY  1981   endometriosis  . TONSILECTOMY, ADENOIDECTOMY, BILATERAL MYRINGOTOMY AND TUBES  1953   age 89  . UTERINE FIBROID SURGERY  1977   with appendectomy    There were no vitals filed for this visit.   Subjective Assessment - 03/15/21 1023    Subjective My low back does not hurt as much since TPDN but I do have pain on my right mid back    Pertinent History essential tremors for years. GERD , depression, Hypercholesteremia,    Limitations House hold activities;Walking    Patient Stated Goals I want to be able to put on my bed sheet.    Currently in Pain? Yes     Pain Score 3     Pain Location Back    Pain Orientation Mid;Right   upper   Pain Descriptors / Indicators Aching    Pain Type Chronic pain                             [facility] Adult PT Treatment/Exercise - 03/15/21 0001      Ambulation/Gait   Ambulation Distance (Feet) 300 Feet    Assistive device Rolling walker    Gait Pattern Decreased trunk rotation;Poor foot clearance - left;Poor foot clearance - right;Decreased hip/knee flexion - right;Decreased hip/knee flexion - left    Gait Comments Supervision for directing walker and direction in unfamilar environment walking for 326 feet with walker      Lumbar Exercises: Stretches   Other Lumbar Stretch Exercise thoracic sidelying stretch x 2 and then at wall 4 x to R and 4 times to left,      Lumbar Exercises: Standing   Other Standing Lumbar Exercises wall UE slide for back extension.  bil UE at same time and then R UE /L UE    Other Standing Lumbar Exercises lunge using chair in right hand and left UE on counter- min assist, Left  and Right 5 reps   knee down to airex pad     Lumbar Exercises: Seated   Other Seated Lumbar Exercises sitting diagonals ( touch left shoe with R hand and lift overhead while looking /turning head  repeat oppositie side x 10 each      Knee/Hip Exercises: Standing   Hip Abduction 1 set;10 reps    Hip Extension 1 set;10 reps    Forward Step Up 2 sets;10 reps;Hand Hold: 2      Shoulder Exercises: Stretch   Corner Stretch 2 reps;30 seconds    Corner Stretch Limitations also doorwar strretch 2 x 30 sec                  PT Education - 03/15/21 1038    Education Details Added to HEP for stretching low/mid back added pec and upper back stretch reinforced HEP    Person(s) Educated Patient    Methods Explanation;Demonstration;Tactile cues;Verbal cues;Handout    Comprehension Verbalized understanding;Returned demonstration            PT Short Term Goals - 02/03/21 1251      PT  SHORT TERM GOAL #1   Title Independent with initial HEP    Baseline compliant with HEP and independent    Time 3    Period Weeks    Status Achieved    Target Date 01/13/21      PT SHORT TERM GOAL #2   Title Pt will ambulate with LRAD and improved gait velocity to at least 1. 65 ft/sec    Baseline with cane   1.98FT/sec    Time 3    Period Weeks    Status Achieved             PT Long Term Goals - 03/01/21 1113      PT LONG TERM GOAL #1   Title Independent with advanced HEP    Baseline Pt needs mod cuing of exercises, Pt reports not doing exercises at home    Time 8    Period Weeks    Status On-going    Target Date 04/12/21      PT LONG TERM GOAL #2   Title Pt will be able to rise from floor to mat independently using strategies for fall preparedness    Baseline Pt requires min assist to rise from floor with use of UE on mat in quadripet    Time 6    Period Weeks    Status On-going    Target Date 04/12/21      PT LONG TERM GOAL #3   Title FOTO will improve from 40%intake   to 70%    indicating improved functional mobility.    Baseline eval 40%  03-01-21 48%    Time 6    Period Weeks    Status On-going    Target Date 04/12/21      PT LONG TERM GOAL #4   Title Pt will be able to stand for 20-30 minutes to complete household task    Baseline 02-24-21 pt reports cannot stand greater than 5 minute today    Time 6    Period Weeks    Status On-going    Target Date 04/12/21      PT LONG TERM GOAL #5   Title Pt will show improved LE strength by improving 5 x STS to 15 sec of less and show decrease risk of fall    Baseline 5 x STS 25.52 sec  eval  03-01-21 5 x sts 19.81 sec    Time 6    Period Weeks    Status On-going    Target Date 04/12/21                 Plan - 03/15/21 1017    Clinical Impression Statement Pt presents to clinic with Rolling walker and with slow cadence and difficulty turning walking around corners.  and bending forward excessively with  inability to scan horizon.  Spend session working on opening chest and extending upper spine in order to visually scan for ambulation.   working on stretching to open pectorals and extens thoracic spine.  Pt worked on lunging and squats with weights to improve strength to decrease risk of falls. Will continue to emphasize safety with walker and increasing ambulation using walker in a safe way.  Pt also able to go up and down steps with bil UE support with step over step technique   Will continue POC    Personal Factors and Comorbidities Age;Comorbidity 1;Comorbidity 2    Comorbidities essential tremors for years. GERD , depression, Hypercholesteremia, see med history    Examination-Activity Limitations Bend;Dressing;Hygiene/Grooming;Lift;Locomotion Level;Reach Overhead;Stand;Stairs;Squat;Other    Examination-Participation Restrictions Yard Work    PT Frequency 1x / week    PT Duration 6 weeks    PT Treatment/Interventions ADLs/Self Care Home Management;Aquatic Therapy;Cryotherapy;Physicist, medical;Therapeutic exercise;Therapeutic activities;Functional mobility training;Stair training;Gait training;Neuromuscular re-education;Patient/family education;Passive range of motion;Manual techniques;Dry needling;Taping;Spinal Manipulations;Joint Manipulations    PT Next Visit Plan continue balance next visit, gait with RW, Stairs review, lunges, continue squat with weights, step ups with hand rail, Will benefit from floor to stand x fer training. Pt being seen 1 x a week for 6 weeks to increase strength and ability to rise from ground and fall prevention    PT Home Exercise Plan [email or web address]    Consulted and Agree with Plan of Care Patient           Patient will benefit from skilled therapeutic intervention in order to improve the following deficits and impairments:  Pain,Obesity,Improper body mechanics,Postural dysfunction,Abnormal gait,Decreased activity  tolerance,Decreased mobility,Decreased range of motion,Decreased strength,Impaired flexibility,Increased muscle spasms,Decreased endurance  Visit Diagnosis: Chronic bilateral low back pain without sciatica  Muscle weakness (generalized)  Other abnormalities of gait and mobility  Cramp and spasm  Unsteadiness on feet Add for thoracic pain stretch Access Code: [email or web address]: [email or web address]: 05/31/2022Prepared by: Wayland Denis BeardsleyExercises   Doorway Pec Stretch at 90 Degrees Abduction - 1-2 x daily - 7 x weekly - 1 sets - 3 reps - 20-30 hold  Sidelying Open Book Thoracic Rotation with Knee on Foam Roll - 1 x daily - 7 x weekly - 3 sets - 10 reps  Wall slide forward UE with washcloths 3 x 10    Problem List Patient Active Problem List   Diagnosis Date Noted  . Impaired fasting glucose 07/04/2016  . Depression, major, in remission (HCC) 11/17/2013  . GERD (gastroesophageal reflux disease) 05/14/2013  . Tobacco use disorder 04/24/2012  . Mixed hyperlipidemia 10/26/2011  . 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 Depressive disorder, not elsewhere classified 03/30/2011  . Benign essential tremor 02/27/2011  . Pure hypercholesterolemia 02/27/2011  . Migraine     Garen Lah, PT, [facility] Certified Exercise Expert for the Aging Adult  03/15/21 5:34 PM Phone: [phone] Fax: [phone]  [facility] Outpatient Rehabilitation [facility] [address] [place], [place], [address] Phone: [phone]   Fax:  [phone]  Name: Lindsey Frank MRN: [id number] Date of  Birth: 1946-06-08

## 2021-03-15 NOTE — Patient Instructions (Addendum)
       Garen Lah, PT, [facility] Certified Exercise Expert for the Aging Adult  03/15/21 10:38 AM Phone: [phone] Fax: [phone]

## 2021-03-21 ENCOUNTER — Encounter: Payer: Medicare HMO | Admitting: Family Medicine

## 2021-03-22 ENCOUNTER — Ambulatory Visit: Payer: Medicare HMO | Attending: Family Medicine | Admitting: Physical Therapy

## 2021-03-22 ENCOUNTER — Other Ambulatory Visit: Payer: Self-pay

## 2021-03-22 DIAGNOSIS — G8929 Other chronic pain: Secondary | ICD-10-CM | POA: Diagnosis not present

## 2021-03-22 DIAGNOSIS — R2689 Other abnormalities of gait and mobility: Secondary | ICD-10-CM | POA: Diagnosis not present

## 2021-03-22 DIAGNOSIS — M545 Low back pain, unspecified: Secondary | ICD-10-CM | POA: Insufficient documentation

## 2021-03-22 DIAGNOSIS — R252 Cramp and spasm: Secondary | ICD-10-CM

## 2021-03-22 DIAGNOSIS — M6281 Muscle weakness (generalized): Secondary | ICD-10-CM

## 2021-03-22 DIAGNOSIS — R2681 Unsteadiness on feet: Secondary | ICD-10-CM | POA: Diagnosis not present

## 2021-03-22 NOTE — Therapy (Signed)
[facility] [address] [place], [place], [address] Phone: [phone]   Fax:  [phone]  Physical Therapy Treatment  Patient Details  Name: Lindsey Frank MRN: [id number] Date of Birth: 06-14-1946 Referring Provider (PT): [name] MD   Encounter Date: 03/22/2021   PT End of Session - 03/22/21 1015    Visit Number 10    Number of Visits 15    Date for PT Re-Evaluation 04/14/21    Authorization Type AEtna  ionto not covered    PT Start Time 1015    PT Stop Time 1100    PT Time Calculation (min) 45 min    Equipment Utilized During Treatment Gait belt    Activity Tolerance Patient tolerated treatment well    Behavior During Therapy [facility] for tasks assessed/performed           Past Medical History:  Diagnosis Date  . Abnormal Pap smear of cervix 2019   HR HPV + recurrent   . Depression   . Elevated C-reactive protein (CRP) 01/2005  . Elevated cholesterol 01/2005  . Elevated triglycerides with high cholesterol 01/2005  . GERD (gastroesophageal reflux disease)   . History of endometriosis   . Migraine   . Tremor, essential     Past Surgical History:  Procedure Laterality Date  . BREAST LUMPECTOMY Right 1992   small lumpectomy for benign tumor  . CATARACT EXTRACTION Left 01/02/2018   Dr. Dagoberto Ligas  . COSMETIC SURGERY  2008   neck lift  . LAPAROSCOPY  1981   endometriosis  . TONSILECTOMY, ADENOIDECTOMY, BILATERAL MYRINGOTOMY AND TUBES  1953   age 87  . UTERINE FIBROID SURGERY  1977   with appendectomy    There were no vitals filed for this visit.   Subjective Assessment - 03/22/21 1020    Subjective Pt states that the last session was the first time she came out with no pain. Pt states the only problem she had this week was her back hurts a little bit.    Pertinent History essential tremors for years. GERD , depression, Hypercholesteremia,    Limitations House hold activities;Walking    How long can you  sit comfortably? unlimited with pillow support    How long can you stand comfortably? 5 minutes    How long can you walk comfortably? I still dont feel steady and I use my cane only when I go out. I dont use it in the house    Diagnostic tests none recently    Patient Stated Goals I want to be able to put on my bed sheet.    Currently in Pain? No/denies    Pain Score --    Pain Location Back    Pain Descriptors / Indicators Aching                             [facility] Adult PT Treatment/Exercise - 03/22/21 0001      Ambulation/Gait   Ambulation Distance (Feet) --   2 laps around gym   Assistive device Rolling walker    Gait Pattern Decreased trunk rotation;Poor foot clearance - left;Poor foot clearance - right;Decreased hip/knee flexion - right;Decreased hip/knee flexion - left    Gait Comments Cues for heel strike and SBA for object negotiation      Lumbar Exercises: Stretches   Other Lumbar Stretch Exercise LTR 5x10"; doorway pec stretch x30 sec    Other Lumbar Stretch Exercise thoracic  sidelying stretch 2x30 sec each      Lumbar Exercises: Standing   Other Standing Lumbar Exercises wall UE slide for back extension x10.  bil UE at same time and then R UE /L UE    Other Standing Lumbar Exercises scapular retraction x10; chin tuck x10; lunge using chair in right hand and left UE on counter- min assist, Left and Right 10 reps      Knee/Hip Exercises: Standing   Functional Squat 10 reps   10 lbs squattingto chair                   PT Short Term Goals - 02/03/21 1251      PT SHORT TERM GOAL #1   Title Independent with initial HEP    Baseline compliant with HEP and independent    Time 3    Period Weeks    Status Achieved    Target Date 01/13/21      PT SHORT TERM GOAL #2   Title Pt will ambulate with LRAD and improved gait velocity to at least 1. 65 ft/sec    Baseline with cane   1.98FT/sec    Time 3    Period Weeks    Status Achieved              PT Long Term Goals - 03/01/21 1113      PT LONG TERM GOAL #1   Title Independent with advanced HEP    Baseline Pt needs mod cuing of exercises, Pt reports not doing exercises at home    Time 8    Period Weeks    Status On-going    Target Date 04/12/21      PT LONG TERM GOAL #2   Title Pt will be able to rise from floor to mat independently using strategies for fall preparedness    Baseline Pt requires min assist to rise from floor with use of UE on mat in quadripet    Time 6    Period Weeks    Status On-going    Target Date 04/12/21      PT LONG TERM GOAL #3   Title FOTO will improve from 40%intake   to 70%    indicating improved functional mobility.    Baseline eval 40%  03-01-21 48%    Time 6    Period Weeks    Status On-going    Target Date 04/12/21      PT LONG TERM GOAL #4   Title Pt will be able to stand for 20-30 minutes to complete household task    Baseline 02-24-21 pt reports cannot stand greater than 5 minute today    Time 6    Period Weeks    Status On-going    Target Date 04/12/21      PT LONG TERM GOAL #5   Title Pt will show improved LE strength by improving 5 x STS to 15 sec of less and show decrease risk of fall    Baseline 5 x STS 25.52 sec  eval   03-01-21 5 x sts 19.81 sec    Time 6    Period Weeks    Status On-going    Target Date 04/12/21                 Plan - 03/22/21 1058    Clinical Impression Statement Continued to work on improving gait quality and heel strike. Less cues required to decrease her forward bend. Continued to work  on postural stabilization at wall opening pecs and mid thoracic spine. Progressed pt's lunges and squats for continued improvements with strength.    Personal Factors and Comorbidities Age;Comorbidity 1;Comorbidity 2    Comorbidities essential tremors for years. GERD , depression, Hypercholesteremia, see med history    Examination-Activity Limitations Bend;Dressing;Hygiene/Grooming;Lift;Locomotion  Level;Reach Overhead;Stand;Stairs;Squat;Other    Examination-Participation Restrictions Yard Work    PT Frequency 1x / week    PT Duration 6 weeks    PT Treatment/Interventions ADLs/Self Care Home Management;Aquatic Therapy;Cryotherapy;Physicist, medical;Therapeutic exercise;Therapeutic activities;Functional mobility training;Stair training;Gait training;Neuromuscular re-education;Patient/family education;Passive range of motion;Manual techniques;Dry needling;Taping;Spinal Manipulations;Joint Manipulations    PT Next Visit Plan continue balance next visit, gait with RW, lunges, continue squat with weights, step ups with hand rail, Will benefit from floor to stand x fer training. Pt being seen 1 x a week for 6 weeks to increase strength and ability to rise from ground and fall prevention    PT Home Exercise Plan [id number]    Consulted and Agree with Plan of Care Patient           Patient will benefit from skilled therapeutic intervention in order to improve the following deficits and impairments:  Pain,Obesity,Improper body mechanics,Postural dysfunction,Abnormal gait,Decreased activity tolerance,Decreased mobility,Decreased range of motion,Decreased strength,Impaired flexibility,Increased muscle spasms,Decreased endurance  Visit Diagnosis: Chronic bilateral low back pain without sciatica  Muscle weakness (generalized)  Other abnormalities of gait and mobility  Cramp and spasm  Unsteadiness on feet     Problem List Patient Active Problem List   Diagnosis Date Noted  . Impaired fasting glucose 07/04/2016  . Depression, major, in remission (HCC) 11/17/2013  . GERD (gastroesophageal reflux disease) 05/14/2013  . Tobacco use disorder 04/24/2012  . Mixed hyperlipidemia 10/26/2011  . 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 Depressive disorder, not elsewhere classified 03/30/2011  . Benign essential tremor 02/27/2011  . Pure hypercholesterolemia  02/27/2011  . Migraine     Lavender Stanke April Ma L Monroe PT, DPT 03/22/2021, 11:06 AM  [facility] [address] [place], [place], [address] Phone: [phone]   Fax:  [phone]  Name: ANITHA KREISER MRN: [id number] Date of Birth: November 07, 1945
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
## 2021-03-24 ENCOUNTER — Other Ambulatory Visit: Payer: Self-pay

## 2021-03-24 ENCOUNTER — Encounter: Payer: Self-pay | Admitting: Family Medicine

## 2021-03-24 ENCOUNTER — Ambulatory Visit (INDEPENDENT_AMBULATORY_CARE_PROVIDER_SITE_OTHER): Payer: Medicare HMO | Admitting: Family Medicine

## 2021-03-24 ENCOUNTER — Telehealth: Payer: Self-pay | Admitting: Family Medicine

## 2021-03-24 VITALS — BP 118/72 | HR 64 | Ht 63.5 in | Wt 146.8 lb

## 2021-03-24 DIAGNOSIS — R7301 Impaired fasting glucose: Secondary | ICD-10-CM | POA: Diagnosis not present

## 2021-03-24 DIAGNOSIS — Z1211 Encounter for screening for malignant neoplasm of colon: Secondary | ICD-10-CM | POA: Diagnosis not present

## 2021-03-24 DIAGNOSIS — G4700 Insomnia, unspecified: Secondary | ICD-10-CM | POA: Diagnosis not present

## 2021-03-24 DIAGNOSIS — G8929 Other chronic pain: Secondary | ICD-10-CM

## 2021-03-24 DIAGNOSIS — R69 Illness, unspecified: Secondary | ICD-10-CM | POA: Diagnosis not present

## 2021-03-24 DIAGNOSIS — G250 Essential tremor: Secondary | ICD-10-CM | POA: Diagnosis not present

## 2021-03-24 DIAGNOSIS — F325 Major depressive disorder, single episode, in full remission: Secondary | ICD-10-CM

## 2021-03-24 DIAGNOSIS — Z5181 Encounter for therapeutic drug level monitoring: Secondary | ICD-10-CM | POA: Diagnosis not present

## 2021-03-24 DIAGNOSIS — E782 Mixed hyperlipidemia: Secondary | ICD-10-CM | POA: Diagnosis not present

## 2021-03-24 DIAGNOSIS — R87618 Other abnormal cytological findings on specimens from cervix uteri: Secondary | ICD-10-CM | POA: Diagnosis not present

## 2021-03-24 DIAGNOSIS — M545 Low back pain, unspecified: Secondary | ICD-10-CM | POA: Diagnosis not present

## 2021-03-24 LAB — POCT GLYCOSYLATED HEMOGLOBIN (HGB A1C): Hemoglobin A1C: 5.7 % — AB (ref 4.0–5.6)

## 2021-03-24 MED ORDER — CLONAZEPAM 1 MG PO TABS: 1.0000 mg | ORAL_TABLET | Freq: Two times a day (BID) | ORAL | 1 refills | Status: DC

## 2021-03-24 NOTE — Telephone Encounter (Signed)
Orders entered

## 2021-03-24 NOTE — Telephone Encounter (Signed)
Pt is coming in 12/22 for her cpe and would like to come in 12/19 for her lab work if that is ok

## 2021-03-24 NOTE — Patient Instructions (Addendum)
Please call Dr. Hyacinth Meeker to schedule your visit--you are past due for repeat pap smear and follow-up for abnormal pap.  Please do the Cologuard screening for colon cancer before it expires (I believe it is good for a year, given in December).  Please get your Tdap (tetanus booster) from the pharmacy.  I recommend getting the new shingles vaccine (Shingrix). Since you have Medicare, you will need to get this from the pharmacy, as it is covered by Part D. This is a series of 2 injections, spaced 2 months apart.   You need to wait at least 2-4 weeks after the tetanus shot before starting the shingles vaccines.

## 2021-03-31 ENCOUNTER — Other Ambulatory Visit: Payer: Self-pay

## 2021-03-31 ENCOUNTER — Ambulatory Visit: Payer: Medicare HMO | Admitting: Physical Therapy

## 2021-03-31 ENCOUNTER — Encounter: Payer: Self-pay | Admitting: Physical Therapy

## 2021-03-31 DIAGNOSIS — G8929 Other chronic pain: Secondary | ICD-10-CM | POA: Diagnosis not present

## 2021-03-31 DIAGNOSIS — R2689 Other abnormalities of gait and mobility: Secondary | ICD-10-CM

## 2021-03-31 DIAGNOSIS — M545 Low back pain, unspecified: Secondary | ICD-10-CM | POA: Diagnosis not present

## 2021-03-31 DIAGNOSIS — R252 Cramp and spasm: Secondary | ICD-10-CM

## 2021-03-31 DIAGNOSIS — R2681 Unsteadiness on feet: Secondary | ICD-10-CM

## 2021-03-31 DIAGNOSIS — M6281 Muscle weakness (generalized): Secondary | ICD-10-CM

## 2021-03-31 NOTE — Therapy (Signed)
[place], [place], [address] Phone: [phone]   Fax:  [phone]  Physical Therapy Treatment  Patient Details  Name: Lindsey Frank MRN: [id number] Date of Birth: 1946-04-07 Referring Provider (PT): Rita Ohara MD   Encounter Date: 03/31/2021   PT End of Session - 03/31/21 1017     Visit Number 11    Number of Visits 15    Date for PT Re-Evaluation 04/14/21    Authorization Type AEtna  ionto not covered    PT Start Time 1015    PT Stop Time 1055    PT Time Calculation (min) 40 min    Activity Tolerance Patient tolerated treatment well    Behavior During Therapy [facility] for tasks assessed/performed             Past Medical History:  Diagnosis Date   Abnormal Pap smear of cervix 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 Kathrin Penner   COSMETIC SURGERY  2008   neck lift   [place]   endometriosis   TONSILECTOMY, ADENOIDECTOMY, BILATERAL [place]   age 5   [place]   with appendectomy    There were no vitals filed for this visit.   Subjective Assessment - 03/31/21 1015     Subjective I do not have pain now. My back has been better. I only have pain when I do something like change the sheets.    Currently in Pain? No/denies                [facility] PT Assessment - 03/31/21 0001       Observation/Other Assessments   Focus on Therapeutic Outcomes (FOTO)  63% taken on 03/22/21 (improved from 40%)      Sit to Stand   Comments 15.0 sec 5 x STS       Strength   Right Hip Flexion 4/5    Left Hip Flexion 4+/5                           [facility] Adult PT Treatment/Exercise - 03/31/21 0001       Ambulation/Gait   Assistive device Rolling walker    Stair Management Technique Two rails;Alternating pattern    Number of Stairs 8    Height of Stairs 6    Gait Comments cues for heel strike      Lumbar Exercises: Supine   Bridge 10 reps    Bridge with clamshell 10 reps    Bridge with [employer] Limitations green    Straight Leg Raise 10 reps    Straight Leg Raises Limitations 1 set each    Other Supine Lumbar Exercises green band supine clams wth 5 sec hold and eccentric retrun x 20      Knee/Hip Exercises: Standing   Heel Raises 10 reps    Heel Raises Limitations toe raises x 10    Hip Flexion 10 reps;2 sets  Hip Abduction 10 reps;2 sets    Forward Step Up 10 reps;2 sets;Hand Hold: 1    Functional Squat 10 reps   10 lbs squattingto chair   Functional Squat Limitations min cues for technique                      PT Short Term Goals - 03/31/21 1018       PT SHORT TERM GOAL #1   Title Independent with initial HEP    Time 3    Period Weeks    Status Achieved    Target Date 01/13/21      PT SHORT TERM GOAL #2   Title Pt will ambulate with LRAD and improved gait velocity to at least 1. 65 ft/sec    Baseline with cane   1.98FT/sec    Time 3    Period Weeks    Status Achieved      PT SHORT TERM GOAL #3   Title Pt low back pain will improve  to at least 4/10 with functional activities    Baseline pain is 2-3/10 with changing sheets.    Time 3    Period Weeks    Status Achieved               PT Long Term Goals - 03/31/21 1046       PT LONG TERM GOAL #1   Title Independent with advanced HEP    Baseline Pt reports she is compliant with her HEP and uses her HEP printouts    Time 8    Period Weeks    Status Partially Met      PT LONG TERM GOAL #2   Title Pt will be able to  rise from floor to mat independently using strategies for fall preparedness    Baseline Pt requires min assist to rise from floor with use of UE on mat in quadripet    Time 6    Period Weeks    Status Unable to assess      PT LONG TERM GOAL #3   Title FOTO will improve from 40%intake   to 70%    indicating improved functional mobility.    Baseline eval 40%  03-01-21 48%, improved to 63% in 03/22/21    Time 6    Period Weeks    Status Partially Met      PT LONG TERM GOAL #4   Title Pt will be able to stand for 20-30 minutes to complete household task    Baseline standing one 1 hour without increased pain 03/31/21    Time 6    Period Weeks    Status Achieved      PT LONG TERM GOAL #5   Title Pt will show improved LE strength by improving 5 x STS to 15 sec of less and show decrease risk of fall    Baseline 5 x STS 15 sec 03/31/21    Time 6    Period Weeks    Status Achieved                   Plan - 03/31/21 1029     Clinical Impression Statement Pt reports she  is able to complete 1 hour of activity in her home without rest and without increased pain. She is also able to drive without back pain. She noted decreased intensity of pain with changing sheets from 6/10 to 2/10. Her 5 X STS has improved to 15  seconds. Continued with LE strengthening, gait with cues for heel strike using RW and balance. Pt tolerated session well with good endurance and no c/o pain. LTG# 4 and #5 met. Her FOTO score has improved from 40 to 63% indicating improved function and progress toward the 70% prediction.    PT Next Visit Plan continue balance next visit, gait with RW, lunges, continue squat with weights, step ups with hand rail, Will benefit from floor to stand x fer training. Pt being seen 1 x a week for 6 weeks to increase strength and ability to rise from ground and fall prevention, work on remaining LTGs.    PT Home Exercise Plan [id number]             Patient will benefit from skilled  therapeutic intervention in order to improve the following deficits and impairments:  Pain, Obesity, Improper body mechanics, Postural dysfunction, Abnormal gait, Decreased activity tolerance, Decreased mobility, Decreased range of motion, Decreased strength, Impaired flexibility, Increased muscle spasms, Decreased endurance  Visit Diagnosis: Chronic bilateral low back pain without sciatica  Muscle weakness (generalized)  Cramp and spasm  Unsteadiness on feet  Other abnormalities of gait and mobility     Problem List Patient Active Problem List   Diagnosis Date Noted   Impaired fasting glucose 07/04/2016   Depression, major, in remission ([place]) 11/17/2013   GERD (gastroesophageal reflux disease) 05/14/2013   Tobacco use disorder 04/24/2012   Mixed hyperlipidemia 10/26/2011   Pure hyperglyceridemia 03/30/2011   Depressive disorder, not elsewhere classified 03/30/2011   Benign essential tremor 02/27/2011   Pure hypercholesterolemia 02/27/2011   Migraine     Dorene Ar, PTA 03/31/2021, 10:59 AM  [facility] [address] [place], [place], [address] Phone: [phone]   Fax:  [phone]  Name: Lindsey Frank MRN: [id number] Date of Birth: [birth year]

## 2021-04-05 ENCOUNTER — Ambulatory Visit: Payer: Medicare HMO | Admitting: Physical Therapy

## 2021-04-05 ENCOUNTER — Encounter: Payer: Self-pay | Admitting: Physical Therapy

## 2021-04-05 ENCOUNTER — Other Ambulatory Visit: Payer: Self-pay

## 2021-04-05 DIAGNOSIS — R252 Cramp and spasm: Secondary | ICD-10-CM

## 2021-04-05 DIAGNOSIS — G8929 Other chronic pain: Secondary | ICD-10-CM

## 2021-04-05 DIAGNOSIS — R2681 Unsteadiness on feet: Secondary | ICD-10-CM | POA: Diagnosis not present

## 2021-04-05 DIAGNOSIS — M6281 Muscle weakness (generalized): Secondary | ICD-10-CM | POA: Diagnosis not present

## 2021-04-05 DIAGNOSIS — M545 Low back pain, unspecified: Secondary | ICD-10-CM

## 2021-04-05 DIAGNOSIS — R2689 Other abnormalities of gait and mobility: Secondary | ICD-10-CM | POA: Diagnosis not present

## 2021-04-05 NOTE — Therapy (Signed)
[place], [place], [address] Phone: [phone]   Fax:  [phone]  Physical Therapy Treatment/Discharge Note  Patient Details  Name: MELL MELLOTT MRN: [id number] Date of Birth: 01-Jun-1946 Referring Provider (PT): Rita Ohara MD   Encounter Date: 04/05/2021   PT End of Session - 04/05/21 1030     Visit Number 12    Number of Visits 15    Date for PT Re-Evaluation 04/14/21    PT Start Time 1020    PT Stop Time 1058    PT Time Calculation (min) 38 min    Activity Tolerance Patient tolerated treatment well    Behavior During Therapy [facility] for tasks assessed/performed             Past Medical History:  Diagnosis Date   Abnormal Pap smear of cervix 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 Kathrin Penner   COSMETIC SURGERY  2008   neck lift   [place]   endometriosis   TONSILECTOMY, ADENOIDECTOMY, BILATERAL [place]   age 75   [place]   with appendectomy    There were no vitals filed for this visit.   Subjective Assessment - 04/05/21 1031     Subjective I do not have any pain anywhere.  I can walk more with my walker and I feel more steady    Pertinent History essential tremors for years. GERD , depression, Hypercholesteremia,    Limitations House hold activities;Walking    How long can you sit comfortably? unlimited with pillow support    How long can you stand comfortably? I can cook food for 45 min standing in my small kitchen    How long can  you walk comfortably? I walk down about 9/10th of a mile trying to walk some every day    Diagnostic tests none recently    Patient Stated Goals I want to be able to put on my bed sheet.    Currently in Pain? No/denies    Pain Score 0-No pain    Pain Location Back                [facility] PT Assessment - 04/05/21 0001       Observation/Other Assessments   Focus on Therapeutic Outcomes (FOTO)  63% taken on 03/22/21   77% intake on 04-05-21      Sit to Stand   Comments 14.87 sec      AROM   Right Hip Flexion 115    Right Hip External Rotation  32    Right Hip Internal Rotation  25    Left Hip Flexion 110    Left Hip External Rotation  36    Left Hip Internal Rotation  23    Lumbar Flexion 90   able to touch floor CGA for safety   Lumbar Extension 30    Lumbar - Right Side Bend 20    Lumbar - Left Side Bend 18    Lumbar -  Right Rotation 50%    Lumbar - Left Rotation 50%      Strength   Right Hip Flexion 4/5    Right Hip Extension 5/5    Right Hip ABduction 4/5    Left Hip Flexion 4+/5    Left Hip Extension 4/5    Left Hip ABduction 4/5    Right Knee Flexion 4+/5    Right Knee Extension 4+/5    Left Knee Flexion 4+/5    Left Knee Extension 4+/5    Right Ankle Dorsiflexion 4+/5    Right Ankle Plantar Flexion 4/5    Left Ankle Dorsiflexion 4+/5    Left Ankle Plantar Flexion 4/5      Ambulation/Gait   Stair Management Technique Two rails;Alternating pattern    Number of Stairs 8    Height of Stairs 6                           [facility] Adult PT Treatment/Exercise - 04/05/21 0001       Ambulation/Gait   Assistive device Rolling walker    Gait Comments cues for heel strike      Self-Care   Self-Care Other Self-Care Comments    Other Self-Care Comments  fall preparedness ,floor to standing and standing to floor x fer problem solving scenarios      Lumbar Exercises: Supine   Bridge 10 reps    Bridge with clamshell 10 reps    Bridge with [employer]  Limitations green    Straight Leg Raise 10 reps    Straight Leg Raises Limitations 1 set each    Other Supine Lumbar Exercises green band supine clams wth 5 sec hold and eccentric retrun x 20      Knee/Hip Exercises: Standing   Heel Raises 10 reps    Heel Raises Limitations toe raises x 10    Hip Flexion 10 reps;2 sets    Hip Abduction 10 reps;2 sets    Forward Step Up 10 reps;2 sets;Hand Hold: 1    Functional Squat 10 reps   10 lbs squattingto chair                   PT Education - 04/05/21 1216     Education Details reviewed fall  preparedness/prevention and HEP for home use as well as floor to stand x fer    Person(s) Educated Patient    Methods Explanation;Demonstration;Tactile cues;Verbal cues    Comprehension Verbalized understanding;Returned demonstration              PT Short Term Goals - 03/31/21 1018       PT SHORT TERM GOAL #1   Title Independent with initial HEP    Time 3    Period Weeks    Status Achieved    Target Date 01/13/21      PT SHORT TERM GOAL #2   Title Pt will ambulate with LRAD and improved gait velocity to at least 1. 65 ft/sec    Baseline with cane   1.98FT/sec    Time 3    Period Weeks    Status Achieved      PT SHORT TERM GOAL #3   Title Pt low back pain will improve  to at least 4/10 with functional activities    Baseline pain is 2-3/10 with changing sheets.    Time 3    Period Weeks    Status Achieved  PT Long Term Goals - 04/05/21 1033       PT LONG TERM GOAL #1   Title Independent with advanced HEP    Baseline Pt reports she is compliant with her HEP and uses her HEP printouts    Time 8    Period Weeks    Status Achieved      PT LONG TERM GOAL #2   Title Pt will be able to rise from floor to mat independently using strategies for fall preparedness    Baseline Pt demonstrate by getting on quadriped and pushing up with UE.    Time 6    Period Weeks    Status Partially Met      PT LONG  TERM GOAL #3   Title FOTO will improve from 40%intake   to 70%    indicating improved functional mobility.    Baseline eval 40%  03-01-21 48%, improved to 63% in 03/22/21 improved to 77% 04-06-21    Time 6    Period Weeks    Status Achieved      PT LONG TERM GOAL #4   Title Pt will be able to stand for 20-30 minutes to complete household task    Baseline standing  45 min to  1 hour without increased pain    Time 6    Period Weeks    Status Achieved      PT LONG TERM GOAL #5   Title Pt will show improved LE strength by improving 5 x STS to 15 sec of less and show decrease risk of fall    Baseline 5 x STS 14.87 04-05-21    Time 6    Period Weeks    Status Achieved                   Plan - 04/05/21 1227     Clinical Impression Statement Pt reports she is able to complete 1 hour of activity in her home and walk a mile without increased pain.  She is able to drive without back pain.  She reports that she has been able to change sheets with decreased pain changing her Center of gravity.   Her 5 S STS improved to 14.87 sec.  Pt reports using her walker continuously and she states she tries to do her sit to stands every day.  she can do her HEP while reading her HEP sheets.   All LTG's achieved.   and FOTO improved to 77%  Pt Discharged and pleased with current progress.    Personal Factors and Comorbidities Age;Comorbidity 1;Comorbidity 2    Comorbidities essential tremors for years. GERD , depression, Hypercholesteremia, see med history    Examination-Activity Limitations Bend;Dressing;Hygiene/Grooming;Lift;Locomotion Level;Reach Overhead;Stand;Stairs;Squat;Other    Examination-Participation Restrictions Yard Work    Rehab Potential Good    PT Frequency 1x / week    PT Duration 6 weeks    PT Treatment/Interventions ADLs/Self Care Home Management;Aquatic Therapy;Cryotherapy;Presenter, broadcasting;Therapeutic exercise;Therapeutic  activities;Functional mobility training;Stair training;Gait training;Neuromuscular re-education;Patient/family education;Passive range of motion;Manual techniques;Dry needling;Taping;Spinal Manipulations;Joint Manipulations    PT Next Visit Plan DC    PT Home Exercise Plan [id number]    Consulted and Agree with Plan of Care Patient             Patient will benefit from skilled therapeutic intervention in order to improve the following deficits and impairments:  Pain, Obesity, Improper body mechanics, Postural dysfunction, Abnormal gait, Decreased activity tolerance, Decreased mobility, Decreased range of motion, Decreased strength,  Impaired flexibility, Increased muscle spasms, Decreased endurance  Visit Diagnosis: Chronic bilateral low back pain without sciatica  Muscle weakness (generalized)  Unsteadiness on feet  Other abnormalities of gait and mobility  Cramp and spasm     Problem List Patient Active Problem List   Diagnosis Date Noted   Impaired fasting glucose 07/04/2016   Depression, major, in remission ([place]) 11/17/2013   GERD (gastroesophageal reflux disease) 05/14/2013   Tobacco use disorder 04/24/2012   Mixed hyperlipidemia 10/26/2011   Pure hyperglyceridemia 03/30/2011   Depressive disorder, not elsewhere classified 03/30/2011   Benign essential tremor 02/27/2011   Pure hypercholesterolemia 02/27/2011   Migraine   Voncille Lo, PT, [place] Certified Exercise Expert for the Aging Adult  04/05/21 12:53 PM Phone: [phone] Fax: [facility] [address] [place], [place], [address] Phone: [phone]   Fax:  [phone]  Name: EDIT RICCIARDELLI MRN: [id number] Date of Birth: [birth year]  PHYSICAL THERAPY DISCHARGE SUMMARY  Visits from Start of Care: 12  Current functional level related to goals / functional outcomes: As above   Remaining deficits.  As above,  needs walker, some  motor planning minor   Education / Equipment: HEP   Patient agrees to discharge. Patient goals were met. Patient is being discharged due to meeting the stated rehab goals. 12 And being pleased with current functional level Voncille Lo, PT, [facility] Certified Exercise Expert for the Aging Adult  04/05/21 12:53 PM Phone: [phone] Fax: [phone]

## 2021-04-12 ENCOUNTER — Encounter: Payer: Medicare HMO | Admitting: Physical Therapy

## 2021-05-11 ENCOUNTER — Ambulatory Visit
Admission: RE | Admit: 2021-05-11 | Discharge: 2021-05-11 | Disposition: A | Discharge status: Home / Self Care | Payer: Medicare HMO | Source: Ambulatory Visit | Attending: Family Medicine | Admitting: Family Medicine

## 2021-05-11 ENCOUNTER — Ambulatory Visit (INDEPENDENT_AMBULATORY_CARE_PROVIDER_SITE_OTHER): Payer: Medicare HMO | Admitting: Family Medicine

## 2021-05-11 ENCOUNTER — Other Ambulatory Visit: Payer: Self-pay

## 2021-05-11 ENCOUNTER — Encounter: Payer: Self-pay | Admitting: Family Medicine

## 2021-05-11 VITALS — BP 138/80 | HR 60 | Temp 98.9°F | Ht 63.5 in | Wt 141.0 lb

## 2021-05-11 DIAGNOSIS — R2689 Other abnormalities of gait and mobility: Secondary | ICD-10-CM | POA: Diagnosis not present

## 2021-05-11 DIAGNOSIS — R319 Hematuria, unspecified: Secondary | ICD-10-CM

## 2021-05-11 DIAGNOSIS — S0990XA Unspecified injury of head, initial encounter: Secondary | ICD-10-CM | POA: Diagnosis not present

## 2021-05-11 DIAGNOSIS — R296 Repeated falls: Secondary | ICD-10-CM | POA: Diagnosis not present

## 2021-05-11 DIAGNOSIS — M545 Low back pain, unspecified: Secondary | ICD-10-CM

## 2021-05-11 DIAGNOSIS — W19XXXA Unspecified fall, initial encounter: Secondary | ICD-10-CM

## 2021-05-11 DIAGNOSIS — R42 Dizziness and giddiness: Secondary | ICD-10-CM

## 2021-05-11 LAB — POCT URINALYSIS DIP (PROADVANTAGE DEVICE)
Glucose, UA: NEGATIVE mg/dL
Ketones, POC UA: NEGATIVE mg/dL
Leukocytes, UA: NEGATIVE
Nitrite, UA: NEGATIVE
Protein Ur, POC: 100 mg/dL — AB
Specific Gravity, Urine: 1.03
Urobilinogen, Ur: NEGATIVE
pH, UA: 6 (ref 5.0–8.0)

## 2021-05-11 NOTE — Progress Notes (Signed)
Chief Complaint  Patient presents with   Dizziness    Vertigo began shortly after she fell in grocery store as well as low back pain. She is also having frequent falls since. The day that she got pushed down the back of her legs felt like little sponges cut into little pieces. She now feels like she is losing her balance all the time.    Back Pain   2 days ago she started with vertigo. When she tried to get up from bed, she fell backwards, brain felt like it was shaking, like there was water running through it.  It lasted for about a day. If she held her head a certain way, then dizziness would recur.  Triggered by turning her head to the left. Today she hasn't noticed this as much, yesterday was horrible.  Balance hasn't been quite right since finished therapy (was referred for back pain, balance problems)  She has fallen frequently, and hit her head 2-3 times. Last night she states "was the worst"--she hit her head on the right side, on the sink in her bathroom. No LOC. No other injured body parts (just skinned her knees). She fell getting out of shower. Other falls were 2-2.5 weeks ago.  She was discharged a week early from PT due to back pain being better. She reports having issues with balance at that time, but not mentioned to the therapists.  PMH, PSH, SH reviewed  Outpatient Encounter Medications as of 05/11/2021  Medication Sig Note   Calcium Carbonate-Vitamin D 600-200 MG-UNIT TABS Take 600 mg by mouth 2 (two) times daily.    clonazePAM (KLONOPIN) 1 MG tablet Take 1 tablet (1 mg total) by mouth 2 (two) times daily.    omeprazole (PRILOSEC OTC) 20 MG tablet Take 20 mg by mouth daily.    propranolol ER (INDERAL LA) 80 MG 24 hr capsule Take 1 capsule (80 mg total) by mouth daily.    rosuvastatin (CRESTOR) 40 MG tablet Take 1 tablet (40 mg total) by mouth daily.    sertraline (ZOLOFT) 50 MG tablet Take 1 tablet (50 mg total) by mouth daily.    diphenhydramine-acetaminophen (TYLENOL  PM) 25-500 MG TABS tablet Take 2 tablets by mouth at bedtime as needed. (Patient not taking: No sig reported) 08/16/2017: Uses prn, usually if has a headache when going to bed   No facility-administered encounter medications on file as of 05/11/2021.   Allergies  Allergen Reactions   Trazodone And Nefazodone Nausea Only   Mysoline [Primidone] Nausea Only    Nausea and achey   ROS:  Denies any head pain,  joints pains, vision changes.  Denies nausea currently (only with vertigo). Denies F/C, URI symptoms, shortness of breath, chest pain No major bruising (just skinned knees). Currently no pain in back No urinary complaints, numbness, tingling See HPI    PHYSICAL EXAM:  BP 138/80   Pulse 60   Temp 98.9 F (37.2 C) (Tympanic)   Ht 5' 3.5" (1.613 m)   Wt 141 lb (64 kg)   LMP 03/17/1999 (Approximate)   BMI 24.59 kg/m   Elderly female, appears a little weaker and more fragile than at prior visits. HEENT: conjunctiva and sclera are clear, EOMI, fundi benign.  Neck: no spinal tenderness, no lymphadenopathy or mass Heart: regular rate and rhythm Lungs: clear bilaterally Abdomen: soft, nontender Extremities: no edema Neuro: alert and oriented.  Minimal tremor.  Normal strength, sensation, DTR's.   Slight worse finger to nose on the left  Urine SG 1.03, moderate blood, some protein, negative leuks, nitrite, ketones   ASSESSMENT/PLAN:  Multiple falls - and hitting head. Needs PT, as well as CT due to head injuries - Plan: Ambulatory referral to Physical Therapy  Fall, initial encounter - Plan: CT Head Wo Contrast  Balance disorder - Plan: Ambulatory referral to Physical Therapy  Low back pain, unspecified back pain laterality, unspecified chronicity, unspecified whether sciatica present - improved with PT - Plan: POCT Urinalysis DIP (Proadvantage Device)  Closed head injury, initial encounter - Plan: CT Head Wo Contrast  Vertigo - component of BPPV.  Can use bonine prn.   Will have PT eval and treat if persists - Plan: CT Head Wo Contrast, Ambulatory referral to Physical Therapy  Hematuria, unspecified type - no urinary symptoms, will send culture to r/o infection given her recent falls - Plan: Urine Culture  We are referring you for physical therapy to help with balance, prevent falls, and help with vertigo. We are also going to check a CT scan of your head, since you have been falling and hitting your head.   Meclizine is a medication that can help with vertigo. This is over-the-counter (Bonine). You can use it every 8 hours if needed for vertigo. It might make you a little sleepy. Try and avoid the head position that triggers the dizziness.  Your urine was very concentrated--be sure to stay very well hydrated.  Some blood was noted on the urine dip--without any symptoms of kidney or bladder symptoms, or known direct trauma, or urinary complaints, I'm not going to treat this at the moment.  Please let us know if you develop pain with urination, bad odor to the urine, increased leakage of urine, or any other new symptoms.  I'm going to send this for culture to rule out any infection.  Addendum Head CT IMPRESSION: No acute or traumatic finding.  Age related atrophy. LVOVM with results.

## 2021-05-11 NOTE — Patient Instructions (Signed)
  We are referring you for physical therapy to help with balance, prevent falls, and help with vertigo. We are also going to check a CT scan of your head, since you have been falling and hitting your head.   Meclizine is a medication that can help with vertigo. This is over-the-counter (Bonine). You can use it every 8 hours if needed for vertigo. It might make you a little sleepy. Try and avoid the head position that triggers the dizziness.  Your urine was very concentrated--be sure to stay very well hydrated.  Some blood was noted on the urine dip--without any symptoms of kidney or bladder symptoms, or known direct trauma, or urinary complaints, I'm not going to treat this at the moment.  Please let us know if you develop pain with urination, bad odor to the urine, increased leakage of urine, or any other new symptoms.  I'm going to send this for culture to rule out any infection.

## 2021-05-12 ENCOUNTER — Encounter: Payer: Self-pay | Admitting: Family Medicine

## 2021-05-13 NOTE — Progress Notes (Signed)
LVM advising pt . Also advised if she has any questions to call our office back . KH

## 2021-05-18 ENCOUNTER — Encounter: Payer: Self-pay | Admitting: Family Medicine

## 2021-05-18 ENCOUNTER — Other Ambulatory Visit: Payer: Self-pay

## 2021-05-18 ENCOUNTER — Ambulatory Visit (INDEPENDENT_AMBULATORY_CARE_PROVIDER_SITE_OTHER): Payer: Medicare HMO | Admitting: Family Medicine

## 2021-05-18 VITALS — BP 110/70 | HR 56 | Ht 63.5 in | Wt 147.6 lb

## 2021-05-18 DIAGNOSIS — R296 Repeated falls: Secondary | ICD-10-CM

## 2021-05-18 DIAGNOSIS — R42 Dizziness and giddiness: Secondary | ICD-10-CM

## 2021-05-18 DIAGNOSIS — R410 Disorientation, unspecified: Secondary | ICD-10-CM | POA: Diagnosis not present

## 2021-05-18 DIAGNOSIS — M545 Low back pain, unspecified: Secondary | ICD-10-CM

## 2021-05-18 LAB — POCT URINALYSIS DIP (PROADVANTAGE DEVICE)
Bilirubin, UA: NEGATIVE
Glucose, UA: NEGATIVE mg/dL
Ketones, POC UA: NEGATIVE mg/dL
Leukocytes, UA: NEGATIVE
Nitrite, UA: NEGATIVE
Protein Ur, POC: 30 mg/dL — AB
Specific Gravity, Urine: 1.02
Urobilinogen, Ur: NEGATIVE
pH, UA: 6 (ref 5.0–8.0)

## 2021-05-18 NOTE — Progress Notes (Signed)
Chief Complaint  Frank presents with   Consult    Frank is here to have form filled out for rehab at [facility]. Gave her number for neuro rehab, she did not call yet (they did leave messages). I will give son number if needed (he flew in from [place] and set this all up). Also has concerns about fall safety at home. Frank reports that she feels like tech was rough with her post CT and grabbed her by the back of the neck and made her vertigo worse.    Frank presents accompanied by her son, Lindsey Frank, to discuss going to [facility] for assisted living.  She is scheduled to move in tomorrow.  Son reports that since her fall in grocery store, balance and vertigo worse. Frank lives alone, son in [place]. She has a good friend locally to help with things.  Frank doesn't want to live in assisted living in [place] (and no room at [facility]).  She was seen last week after a fall, with complaint of vertigo.  Son reports that she fell in the parking lot on the way to get CT.  Didn't hit head.  No other falls since then.  Memory is somewhat impaired--pt didn't recall she fell in the parking lot, but she had told son. Yesterday, she tripped on threshold of a doorway, and son caught her (or would have been another fall).  Son reports that she last minute cancelled trip to [place] with him last week (she downplayed what was going on). He got a call from her on 7/26 where she was panicked, scared, due to severe vertigo, seen 7/27 here. He came here from [place] on Friday 7/29.  He will be going back this Sunday. He is very worried about her living alone.  She doesn't want to move to [place] (and live in assisted living).  Son found an opening for her at [facility] on [address], and she is scheduled to move there tomorrow.  He is keeping all of her things, and her house, and they are hoping that this will be temporary.  He is hoping that she can get PT daily ([facility] told him this was possible), and that  they can take her to the neurorehab that was recommended (for her vertigo).  Frank is concerned--she feels like this move is rushed, things are moving too fast, asking my opinion. She is still having vertigo, worse first thing in the morning, when trying to sit up.  Son is asking if Parkinson's was considered. Pt has had tremor x 30 yrs, prev saw neuro, not recent.  Microscopic blood was noted in urine at recent visit.  Unfortunately, urine wasn't sent for culture as ordered.  Will recheck today, wanting to r/o UTI as contributing to her decline, especially with some memory changes noted.  She denies any hematuria, dysuria, abdominal complaints, incontinence, fever, chills.  PMH, PSH, SH reviewed   Outpatient Encounter Medications as of 05/18/2021  Medication Sig Note   Calcium Carbonate-Vitamin D 600-200 MG-UNIT TABS Take 600 mg by mouth 2 (two) times daily.    clonazePAM (KLONOPIN) 1 MG tablet Take 1 tablet (1 mg total) by mouth 2 (two) times daily.    diphenhydramine-acetaminophen (TYLENOL PM) 25-500 MG TABS tablet Take 2 tablets by mouth at bedtime as needed. 08/16/2017: Uses prn, usually if has a headache when going to bed   omeprazole (PRILOSEC OTC) 20 MG tablet Take 20 mg by mouth daily.    propranolol ER (INDERAL LA) 80  MG 24 hr capsule Take 1 capsule (80 mg total) by mouth daily.    rosuvastatin (CRESTOR) 40 MG tablet Take 1 tablet (40 mg total) by mouth daily.    sertraline (ZOLOFT) 50 MG tablet Take 1 tablet (50 mg total) by mouth daily.    No facility-administered encounter medications on file as of 05/18/2021.   Allergies  Allergen Reactions   Trazodone And Nefazodone Nausea Only   Mysoline [Primidone] Nausea Only    Nausea and achey   ROS: no fever, chills.  +vertigo, balance problems, falls.  No URI symptoms, no GI or GU complaints. No bleeding, bruising.  Denies significant depression (son notes that she feels somewhat isolated). No joint pains or other complaints.  Does  note some memory issues, son finds her repeating questions more often   PHYSICAL EXAM:  BP 110/70   Pulse (!) 56   Ht 5' 3.5" (1.613 m)   Wt 147 lb 9.6 oz (67 kg)   LMP 03/17/1999 (Approximate)   BMI 25.74 kg/m   Elderly female, in good spirits. Appears a little stronger than last visit, but either not remembering or not hearing things quite as well (having to repeat some things, a little confused). HEENT: conjunctiva and sclera are clear, EOMI, wearing mask Neck: no lymphadenopathy, thyromegaly or carotid bruit Heart: regular rate and rhythm, no murmur Lungs: clear bilaterally Back: no spinal or CVA tenderness Abdomen: soft, nontender Extremities: no edema, normal pulses Psych: normal affect, maybe slightly anxious.  Full range of affect. Normal hygiene, grooming, speech. Neuro: alert, oriented, but intermittently somewhat confused/forgetful. Ambulates slowly with walker.  ASSESSMENT/PLAN:  Vertigo - never called back neuro-rehab to set up appt.  Son made aware, can schedule (and [name] can take her to appt) - Plan: Ambulatory referral to Neurology  Confusion - some change in MS recently, more confused, repeating self. r/o UTI. Refer to neuro - Plan: Urine Culture, Ambulatory referral to Neurology  Frequent falls - Plan: Urine Culture  Multiple falls - normal head CT last week. Vertigo contributing.  Will get PT (HH ordered for [facility]), and also refer to neuro for eval - Plan: Ambulatory referral to Neurology  Low back pain, unspecified back pain laterality, unspecified chronicity, unspecified whether sciatica present - (only mild) - Plan: POCT Urinalysis DIP (Proadvantage Device)  FL-2 FFO We discussed options--needing caregivers in the home, vs assisted living, vs rehab facility. Is already arranged to have AL, and will arrange for home PT (reportedly can be done daily).  Son showed me copy of PPD done/read yesterday. Per forms, will need 2nd step, which can be done  at facility.  Her tetanus is past due--advised of need to get from pharmacy  Son had many questions, all of which were answered to best of my ability. They are both interested in referral to neurologist.   I spent 75 minutes dedicated to the care of this Frank, including pre-visit review of records, face to face time, post-visit ordering of testing and documentation.

## 2021-05-19 ENCOUNTER — Telehealth: Payer: Self-pay | Admitting: Family Medicine

## 2021-05-19 ENCOUNTER — Encounter: Payer: Self-pay | Admitting: Family Medicine

## 2021-05-19 MED ORDER — NICOTINE 7 MG/24HR TD PT24: 7.0000 mg | MEDICATED_PATCH | Freq: Every day | TRANSDERMAL | 2 refills | Status: DC

## 2021-05-19 NOTE — Telephone Encounter (Signed)
Bianca from [facility] called back and states that she she got the order for the gum she thinks that pt should really have the lowest patch, she thinks that pt smokes more than she lets on, when she went in the house the pt home smelled like smoke and she found a pack of cigarettes and a lighter, and that the pt lied to her first about smoking, and the son, says he thinks she smokes more that she tells him also,   He thinks she needs a patch also,

## 2021-05-19 NOTE — Telephone Encounter (Signed)
[name] called stating the pt is requesting a nicotine patch she is moving in today and was a smoker and would like to have one,  They just need a written RX faxed over  [phone] Attn Marena Chancy

## 2021-05-19 NOTE — Telephone Encounter (Signed)
Printed and faxed

## 2021-05-19 NOTE — Telephone Encounter (Signed)
Could not get a hold of anyone so I did fax.

## 2021-05-19 NOTE — Telephone Encounter (Signed)
Our records indicate that she quit years ago.  She hasn't been smoking on a regular basis, in large amounts--even the lowest dose patch likely would be too much for her.  I recommend her having nicotine gum to use if needed instead.  Okay for order, if needed

## 2021-05-19 NOTE — Telephone Encounter (Signed)
Ok for lowest dose patch. If they report she has been smoking, okay to try it.  I've never noted her to smell like smoke, but I think the son must smoke (they smelled of smoke at last visit), and he has been staying with her.)

## 2021-05-19 NOTE — Patient Instructions (Signed)
We filled out and faxed the FL-2 forms for [facility]. We asked for daily PT. Please call to schedule the neuro-rehab visit to help with her vertigo. We put in a referral to see the neurologist, to further assess her falls, memory changes, dizziness.  She is past due for tetanus booster (TdaP)--she will need to get this at some point (covered by medicare part D, needs to get from pharmacy, I can't give it here).  Let me know if you need further information/assistance, and we can put in referral for social worker to reach out with future needs.

## 2021-05-20 ENCOUNTER — Other Ambulatory Visit: Payer: Self-pay

## 2021-05-20 ENCOUNTER — Ambulatory Visit: Payer: Medicare HMO | Attending: Family Medicine

## 2021-05-20 DIAGNOSIS — R42 Dizziness and giddiness: Secondary | ICD-10-CM | POA: Insufficient documentation

## 2021-05-20 DIAGNOSIS — R2681 Unsteadiness on feet: Secondary | ICD-10-CM | POA: Insufficient documentation

## 2021-05-20 LAB — URINE CULTURE

## 2021-05-20 NOTE — Therapy (Signed)
[facility] [address] [place], [place], [address] Phone: [phone]   Fax:  [phone]  Physical Therapy Evaluation  Patient Details  Name: Lindsey Frank MRN: [id number] Date of Birth: January 09, 1946 Referring Provider (PT): Rita Ohara MD   Encounter Date: 05/20/2021   PT End of Session - 05/20/21 0846     Visit Number 1    Number of Visits 1    Date for PT Re-Evaluation 05/22/21    Authorization Type [employer] (10th Visit PN)    PT Start Time [phone]    PT Stop Time 0931    PT Time Calculation (min) 45 min    Activity Tolerance Patient tolerated treatment well    Behavior During Therapy [facility] for tasks assessed/performed             Past Medical History:  Diagnosis Date   Abnormal Pap smear of cervix 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 Kathrin Penner   COSMETIC SURGERY  2008   neck lift   [place]   endometriosis   TONSILECTOMY, ADENOIDECTOMY, BILATERAL [place]   age 71   [place]   with appendectomy    There were no vitals filed for this visit.    Subjective Assessment - 05/20/21 0852     Subjective Patient has been experiencing frequent falls. Had a fall a few weeks prior in the [employer]gery store, after the fall had complaints of vertigo. Patient reports that the vertigo is intermittent. Son reports having multiple falls a day at home. Reports the vertigo is worse in the morning, when trying to sit up. Paitent reports that her brain feels like it is  moving. First time she noticed it is she was in bed. Denies hearing or vision changes. Does have nauseous. Patient reports she is having about everyday, especially with head movement.    Patient is accompained by: Family member   Son   Pertinent History Depression, GERD, Migraine, Essential Tremor    Limitations Walking    Patient Stated Goals resolve vertigo    Currently in Pain? No/denies                [facility] PT Assessment - 05/20/21 0001       Assessment   Medical Diagnosis Vertigo    Referring Provider (PT) Rita Ohara MD    Onset Date/Surgical Date 05/11/21   referral date   Hand Dominance Right    Prior Therapy Outpatient for Low Back      Precautions   Precautions Fall      Restrictions   Weight Bearing Restrictions No      Balance Screen   Has the patient fallen in the past 6 months Yes    How many times? multiple a week    Has the patient had a decrease in activity level because of a fear of falling?  Yes    Is the patient reluctant to leave their home because of a fear of falling?  Yes      [place] living    Additional Comments Just recently moved into [place] at [facility] assisted living.      Prior Function   Level of Independence Independent with household mobility with device;Independent with community mobility with device    Vocation Retired    Leisure Psychologist, clinical   Overall Cognitive Status Within Functional Limits for tasks assessed      Observation/Other Assessments   Focus on Therapeutic Outcomes (FOTO)  DPS: 39 DFS: 35.7      Sensation   Light Touch Appears Intact      AROM   Overall AROM  Within functional limits for tasks performed      Strength   Overall Strength Deficits    Overall Strength Comments general decreased strength in BLE; no formal MMT      Transfers   Transfers Sit to Stand;Stand to Sit    Sit to Stand 5: Supervision    Stand to Sit 5: Supervision       Ambulation/Gait   Ambulation/Gait Yes    Ambulation/Gait Assistance 4: Min guard    Ambulation Distance (Feet) 50 Feet    Assistive device Rollator    Gait Pattern Decreased trunk rotation;Poor foot clearance - left;Poor foot clearance - right;Decreased hip/knee flexion - right;Decreased hip/knee flexion - left    Ambulation Surface Level;Indoor    Gait Comments Patient ambulating with Rollator, cues to avoid bumping into objects with new AD               Vestibular Assessment - 05/20/21 0001       Symptom Behavior   Subjective history of current problem see subjective    Type of Dizziness  Spinning;Imbalance;Vertigo;Lightheadedness    Frequency of Dizziness daily    Duration of Dizziness minutes    Symptom Nature Motion provoked;Positional    Aggravating Factors Turning head quickly;Supine to sit;Lying supine    Relieving Factors Slow movements;Rest    Progression of Symptoms No change since onset    History of similar episodes None      Oculomotor Exam   Oculomotor Alignment Normal    Ocular ROM WNL    Spontaneous Absent    Gaze-induced  Absent    Smooth Pursuits Intact    Saccades Intact      Oculomotor Exam-Fixation Suppressed    Left Head Impulse unable to maintain focus    Right Head Impulse unable to maintain focus      Vestibulo-Ocular Reflex   VOR 1 Head Only (x 1 viewing) Normal    VOR to Slow Head Movement Normal    VOR Cancellation Normal      Positional Testing   Dix-Hallpike Dix-Hallpike Right;Dix-Hallpike Left    Horizontal Canal Testing Horizontal Canal Right;Horizontal Canal Left      Dix-Hallpike Right   Dix-Hallpike Right Duration 0    Dix-Hallpike Right Symptoms No nystagmus      Dix-Hallpike Left   Dix-Hallpike Left Duration 0    Dix-Hallpike Left Symptoms No nystagmus      Horizontal Canal Right   Horizontal Canal Right Duration 0    Horizontal Canal Right Symptoms Normal      Horizontal Canal Left   Horizontal Canal Left Duration  0    Horizontal Canal Left Symptoms Normal      Positional Sensitivities   Sit to Supine No dizziness    Supine to Left Side No  dizziness    Supine to Right Side No dizziness    Supine to Sitting No dizziness    Right Hallpike No dizziness    Up from Right Hallpike Lightheadedness    Up from Left Hallpike Lightheadedness    Rolling Right No dizziness    Rolling Left No dizziness      Orthostatics   BP supine (x 5 minutes) 101/67    HR supine (x 5 minutes) 54    BP sitting 100/60    HR sitting 52    BP standing (after 1 minute) 89/62    HR standing (after 1 minute) 57    BP standing (after 3 minutes) 99/48    HR standing (after 3 minutes) 56    Orthostatics Comment patient reports significant lightheadedness with position changes; reports similar sensation with assesment of orthostatics as dizziness is experiencing at home.                Objective measurements completed on examination: See above findings.               PT Education - 05/20/21 1101     Education Details Follow up with [place]; Continued use of Rollator for Safety; No PT needs for Vertigo at this time.    Person(s) Educated Patient;Child(ren)    Methods Explanation    Comprehension Verbalized understanding              PT Short Term Goals - 03/31/21 1018       PT SHORT TERM GOAL #1   Title Independent with initial HEP    Time 3    Period Weeks    Status Achieved    Target Date 01/13/21      PT SHORT TERM GOAL #2   Title Pt will ambulate with LRAD and improved gait velocity to at least 1. 65 ft/sec    Baseline with cane   1.98FT/sec    Time 3    Period Weeks    Status Achieved      PT SHORT TERM GOAL #3   Title Pt low back pain will improve  to at least 4/10 with functional activities    Baseline pain is 2-3/10 with changing sheets.    Time 3    Period Weeks    Status Achieved               PT Long Term Goals - 04/05/21 1033       PT LONG  TERM GOAL #1   Title Independent with advanced HEP    Baseline Pt reports she is compliant with her HEP and uses her HEP printouts    Time 8    Period Weeks    Status Achieved      PT LONG TERM GOAL #2   Title Pt will be able to rise from floor to mat independently using strategies for fall preparedness    Baseline Pt demonstrate by getting on quadriped and pushing up with UE.    Time 6    Period Weeks    Status Partially Met      PT LONG TERM GOAL #3   Title FOTO will improve from 40%intake   to 70%    indicating improved functional mobility.    Baseline eval 40%  03-01-21 48%, improved to 63% in 03/22/21 improved to 77% 04-06-21    Time 6    Period Weeks    Status Achieved      PT LONG TERM  GOAL #4   Title Pt will be able to stand for 20-30 minutes to complete household task    Baseline standing  45 min to  1 hour without increased pain    Time 6    Period Weeks    Status Achieved      PT LONG TERM GOAL #5   Title Pt will show improved LE strength by improving 5 x STS to 15 sec of less and show decrease risk of fall    Baseline 5 x STS 14.87 04-05-21    Time 6    Period Weeks    Status Achieved                    Plan - 05/20/21 1251     Clinical Impression Statement Patient is a 75 y.o. female refered to Neuro OPPT services for Vertigo. Patient's PMH significant for the following; Depression, GERD, Migraine, Essential Tremor. Upon evaluation patient had normal oculomotor exam, (-) positional testing, and normal VOR bilaterally. With MSQ patient did report some lightheadedness that was similar to the symptoms patietn was having at home. Therefore PT further assesesd Orthostatics, with patient having sigificant drop in systolic and diastolic with positional changes. PT educating on need to follow up with PCP regarding these findings to determine best management. With prolonged standing patient BP continued to drop, potentially leading to elevated fall risk with standing  activites. Patient and son agreeable to follow up with PCP. Will reutrn to Outpatient PT services if needed, but most likely will recieve PT at [facility].    Personal Factors and Comorbidities Comorbidity 2;Age    Comorbidities Depression, GERD, Migraine, Essential Tremor    Examination-Activity Limitations Bed Mobility;Stand;Sit;Transfers    Examination-Participation Restrictions Yard Work;Cleaning;Meal Prep    Stability/Clinical Decision Making Evolving/Moderate complexity    Clinical Decision Making Moderate    Rehab Potential Fair    PT Frequency One time visit    PT Treatment/Interventions ADLs/Self Care Home Management;Aquatic Therapy;Cryotherapy;Presenter, broadcasting;Therapeutic exercise;Therapeutic activities;Functional mobility training;Stair training;Gait training;Neuromuscular re-education;Patient/family education;Passive range of motion;Manual techniques;Dry needling;Taping;Spinal Manipulations;Joint Manipulations;Canalith Repostioning;DME Instruction;Vestibular    PT Next Visit Plan Re-Cert needed if patient returns. Update from PCP regarding BP/Orthostatics.    Recommended Other Services PT/OT services at [facility] with Plan of Care Patient             Patient will benefit from skilled therapeutic intervention in order to improve the following deficits and impairments:  Decreased balance, Decreased endurance, Decreased activity tolerance, Dizziness, Difficulty walking, Postural dysfunction, Pain  Visit Diagnosis: Unsteadiness on feet  Dizziness and giddiness     Problem List Patient Active Problem List   Diagnosis Date Noted   Impaired fasting glucose 07/04/2016   Depression, major, in remission ([place]) 11/17/2013   GERD (gastroesophageal reflux disease) 05/14/2013   Tobacco use disorder 04/24/2012   Mixed hyperlipidemia 10/26/2011   Pure hyperglyceridemia 03/30/2011   Depressive disorder, not elsewhere  classified 03/30/2011   Benign essential tremor 02/27/2011   Pure hypercholesterolemia 02/27/2011   Migraine     Jones Bales, PT, DPT 05/20/2021, 12:57 PM  [facility] [address] [place], [place], [address] Phone: [phone]   Fax:  [phone]  Name: Lindsey Frank MRN: [id number] Date of Birth: 11/23/1945

## 2021-05-21 DIAGNOSIS — H814 Vertigo of central origin: Secondary | ICD-10-CM | POA: Diagnosis not present

## 2021-05-21 DIAGNOSIS — K219 Gastro-esophageal reflux disease without esophagitis: Secondary | ICD-10-CM | POA: Diagnosis not present

## 2021-05-21 DIAGNOSIS — G250 Essential tremor: Secondary | ICD-10-CM | POA: Diagnosis not present

## 2021-05-21 DIAGNOSIS — R7301 Impaired fasting glucose: Secondary | ICD-10-CM | POA: Diagnosis not present

## 2021-05-21 DIAGNOSIS — R69 Illness, unspecified: Secondary | ICD-10-CM | POA: Diagnosis not present

## 2021-05-21 DIAGNOSIS — H8113 Benign paroxysmal vertigo, bilateral: Secondary | ICD-10-CM | POA: Diagnosis not present

## 2021-05-21 DIAGNOSIS — E781 Pure hyperglyceridemia: Secondary | ICD-10-CM | POA: Diagnosis not present

## 2021-05-23 ENCOUNTER — Ambulatory Visit: Payer: Medicare HMO | Admitting: Neurology

## 2021-05-23 ENCOUNTER — Other Ambulatory Visit: Payer: Self-pay

## 2021-05-23 ENCOUNTER — Telehealth: Payer: Self-pay | Admitting: *Deleted

## 2021-05-23 ENCOUNTER — Telehealth (INDEPENDENT_AMBULATORY_CARE_PROVIDER_SITE_OTHER): Payer: Medicare HMO | Admitting: Family Medicine

## 2021-05-23 ENCOUNTER — Encounter: Payer: Self-pay | Admitting: Neurology

## 2021-05-23 ENCOUNTER — Encounter: Payer: Self-pay | Admitting: Family Medicine

## 2021-05-23 VITALS — Ht 63.0 in | Wt 144.0 lb

## 2021-05-23 DIAGNOSIS — R42 Dizziness and giddiness: Secondary | ICD-10-CM | POA: Diagnosis not present

## 2021-05-23 DIAGNOSIS — R413 Other amnesia: Secondary | ICD-10-CM | POA: Diagnosis not present

## 2021-05-23 DIAGNOSIS — G250 Essential tremor: Secondary | ICD-10-CM | POA: Diagnosis not present

## 2021-05-23 DIAGNOSIS — I951 Orthostatic hypotension: Secondary | ICD-10-CM | POA: Diagnosis not present

## 2021-05-23 DIAGNOSIS — R296 Repeated falls: Secondary | ICD-10-CM

## 2021-05-23 MED ORDER — PROPRANOLOL HCL ER 60 MG PO CP24: 60.0000 mg | ORAL_CAPSULE | Freq: Every day | ORAL | 2 refills | Status: DC

## 2021-05-23 NOTE — Telephone Encounter (Signed)
Patient and son were hoping (and were told that they could) to have daily therapy while there. The neuro-rehab was for BPV, separate issue from her balance and falling.

## 2021-05-23 NOTE — Telephone Encounter (Signed)
Genevieve with [facility] was calling for verbal orders for PT/OT. Requesting 2 x a weeks for 4 weeks and then 1 x a week for 3 weeks. This is for strengthening with standing and walking, balance and gait and gait training. She needed clarification as to whether or not patient needed neuro rehab, mentioned something about something mentioning this. I did look at note from neuro rehab from Friday it looked like they said patient would not benefit from neuro rehab so I am assuming that [facility] referred her to [facility] for PT/OT?

## 2021-05-23 NOTE — Progress Notes (Signed)
Subjective:    Patient ID: [name] is a 75 y.o. female.  HPI    Lindsey Age, MD, PhD [facility] [address], [address] [place], [place] [address]  Dear Lindsey Frank,   I saw your patient, Lindsey Frank, upon your kind request in my neurologic clinic today for initial consultation of her gait disorder, and recurrent falls.  The patient is accompanied by a friend, Lindsey Frank (on [place]) today.  As you know, Lindsey Frank is a 75 year old right-handed woman with an underlying medical history of reflux disease, hyperlipidemia, depression, essential tremor, history of endometriosis and borderline overweight state, who reports difficulty with her balance.  She has had some falls.  She reports intermittent vertigo type symptoms.  She feels that she has positional vertigo off and on since she fell and hit her head several weeks ago.  She had a fall in March 2022 at the grocery store per her friend.  She reports that her shopping cart was pushed out of the way and she was actually leaning on the cart and that is why she fell.  She has had intermittent weakness affecting her legs but no sudden onset of one-sided weakness or numbness or tingling or droopy face or slurring of speech.  Lindsey Frank is also worried about her memory loss which has been ongoing for the past few months.  Patient is not concerned about memory issues but does report word finding difficulty and losing her train of thought, some forgetfulness.  She has not driven a car in the past several weeks.  She is not aware of any family history of memory loss or dementia.  She has been using a 4 wheeled walker for the past week or so.  Prior to that she used a 2 wheeled walker and a 4 pronged cane.  She quit smoking about a month ago and has been on a nicotine patch.  She drinks alcohol rarely.  She drinks caffeine in the form of coffee, 1 cup/day.  She does not drink a whole lot of water, estimates that  she drinks about 3-4 8 ounce bottles of water per day.  She likes to drink caffeine free diet soda. I reviewed your office note from 05/18/2021.  She had a recent head CT without contrast on 05/11/2021 after a fall.  I reviewed the results: Impression: No acute or traumatic finding.  Frank related atrophy.  She recently into [facility] assisted living.  She has fallen several times, unclear how many times, no injuries thankfully, no loss of consciousness. She has felt lightheaded upon standing.  She feels off balance when she suddenly stands up.  She has had intermittent symptoms of spinning sensation especially when she turns in bed.  She has not seen ENT.  Of note, she is on long-acting Inderal 80 mg once daily and she has been on clonazepam.  She is currently on 1 mg strength twice daily.  She has been on clonazepam for years.  I had seen her several years ago for essential tremor.  Prior to that she had seen Lindsey Frank. She has had no recent blood work in the past couple of months with the exception of A1c on 03/24/2021, which was 5.7.     Previously:   07/14/14: 75 year old right-handed woman with an underlying medical history of reflux disease, migraine headaches, depression, hyperlipidemia, status post appendectomy, tonsillectomy and adenoidectomy, as well as laparoscopic surgery for endometriosis, who presents for followup consultation of her essential  tremor. She is unaccompanied today. I first met her on 01/08/2014 at the request of her primary care physician, at which time she reported a long-standing history of essential tremor. She used to see Dr. Felecia Frank in [facility] in the past, but had not seen any neurologist in years. At the time of her first visit with me I suggested changing her nadolol to Inderal LA 80 mg once daily. I did keep her on her clonazepam, but suggested we consider tapering the clonazepam in the long run.   Today, she reports that she could not tolerate it in a row. She went back to  nadolol and continues with clonazepam.   She was stable with with clonazepam and nadolol for years. She retired in 2011. She also saw Lindsey Frank in or around 2011 and was tested for MS, but was told she did not have MS. She could not tolerate mysoline, which made her sick. Nadolol is expensive for. She has no FHx of ET, but there is a FHx of dementia and possibly PD. Her symptoms started in the 90s and she had a head tremor and bilateral hand tremors, and she feels she has progressed. She takes nadolol 40 mg in AM and 80 mg at night. Lindsey Frank encouraged her to come off of clonazepam. She tried other meds, but no gabapentin or topamax. She has not been on propranolol. She rarely drinks alcohol. She smokes about 1/2 ppd. She is trying to quit smoking.   Her Past Medical History Is Significant For: Past Medical History:  Diagnosis Date   Abnormal Pap smear of cervix 2019   HR HPV + recurrent    Depression    Elevated C-reactive protein (CRP) 01/2005   Elevated cholesterol 01/2005   Elevated triglycerides with high cholesterol 01/2005   GERD (gastroesophageal reflux disease)    History of endometriosis    Migraine    Tremor, essential     Her Past Surgical History Is Significant For: Past Surgical History:  Procedure Laterality Date   BREAST LUMPECTOMY Right 1992   small lumpectomy for benign tumor   CATARACT EXTRACTION Left 01/02/2018   Dr. Kathrin Frank   COSMETIC SURGERY  2008   neck lift   LAPAROSCOPY  1981   endometriosis   TONSILECTOMY, ADENOIDECTOMY, BILATERAL MYRINGOTOMY [place]   Frank 48   [place]   with appendectomy    Her Family History Is Significant For: Family History  Problem Relation Frank of Onset   Diabetes Mother    Heart disease Father 59   Stroke Father 48   Throat cancer Father    Depression Brother    Stomach cancer Maternal Aunt    Depression Maternal Uncle        suicide   Alzheimer's disease Cousin     Her Social History Is  Significant For: Social History   Socioeconomic History   Marital status: Divorced    Spouse name: Not on file   Number of children: 1   Years of education: college   Highest education level: Not on file  Occupational History   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  Son lives in [place], 4 grandchildren   College Education   Right handed   Caffeine one cup daily (tea with milk).   Social Determinants of Health   Financial Resource Strain: Not on file  Food Insecurity: Not on file  Transportation Needs: Not on file  Physical Activity: Not on file  Stress: Not on file  Social Connections: Not on file    Her Allergies Are:  Allergies  Allergen Reactions   Trazodone And Nefazodone Nausea Only   Mysoline [Primidone] Nausea Only    Nausea and achey  :   Her Current Medications Are:  Outpatient Encounter Medications as of 05/23/2021  Medication Sig   Calcium Carbonate-Vitamin D 600-200 MG-UNIT TABS Take 600 mg by mouth 2 (two) times daily.   clonazePAM (KLONOPIN) 1 MG tablet Take 1 tablet (1 mg total) by mouth 2 (two) times daily.   diphenhydramine-acetaminophen (TYLENOL PM) 25-500 MG TABS tablet Take 2 tablets by mouth at bedtime as needed.   nicotine (NICODERM CQ - DOSED IN MG/24 HR) 7 mg/24hr patch Place 1 patch (7 mg total) onto the skin daily.   omeprazole (PRILOSEC OTC) 20 MG tablet Take 20 mg by  mouth daily.   propranolol ER (INDERAL LA) 80 MG 24 hr capsule Take 1 capsule (80 mg total) by mouth daily.   rosuvastatin (CRESTOR) 40 MG tablet Take 1 tablet (40 mg total) by mouth daily.   sertraline (ZOLOFT) 50 MG tablet Take 1 tablet (50 mg total) by mouth daily.   No facility-administered encounter medications on file as of 05/23/2021.  :   Review of Systems:  Out of a complete 14 point review of systems, all are reviewed and negative with the exception of these symptoms as listed below:   Review of Systems  Neurological:        Pt states she is here for vertigo. Pt states she has fell 4 times in the past month . Pt states she has hit her each time she has fallen . When she gets up she has dizziness. Pt denies confusion but daughter said she has witness increased confusion in the past month . Daughter states pt had CT scan in the past 2 weeks . CT scan clear       Objective:  Neurological Exam  Physical Exam Physical Examination:   There were no vitals filed for this visit.  On orthostatic testing, sitting blood pressure and pulse was 125/67 with a pulse of 51, standing 100/74 with a pulse of 56.  General Examination: The patient is a very pleasant 75 y.o. female in no acute distress. She appears well-developed and well-nourished and well groomed.   HEENT: Normocephalic, atraumatic, pupils are equal, round and reactive to light.  Extraocular tracking is mildly impaired, she has a mild side to side head tremor, intermittent voice tremor noted.  Neck is supple otherwise with full range of motion, no carotid bruits.  Face is symmetric with normal facial animation, no dysarthria.  Airway exam reveals moderate mouth dryness, tongue protrudes centrally and palate elevates symmetrically.    Chest: Clear to auscultation without wheezing, rhonchi  or crackles noted.   Heart: S1+S2+0, regular and normal without murmurs, rubs or gallops noted.   Abdomen: Soft, non-tender and non-distended  with normal bowel sounds appreciated on auscultation.   Extremities: There is trace edema in the distal lower extremities bilaterally.     Skin: Warm and dry without trophic changes noted.    Musculoskeletal: exam reveals no obvious joint deformities.   Neurologically: Mental status: The patient is awake, alert and oriented in all 4 spheres. Her immediate and remote memory are mildly impaired.  She is not able to give a lot of details to her history, details are provided by her friend. Thought process is linear. Mood is normal and affect is normal.  MMSE - Mini Mental State Exam 05/23/2021  Orientation to time 2  Orientation to Place 5  Registration 3  Attention/ Calculation 2  Recall 1  Language- name 2 objects 2  Language- repeat 1  Language- follow 3 step command 2  Language- read & follow direction 1  Write a sentence 1  Copy design 0  Total score 20    On 05/23/2021: CDT: 4/4, AFT: 2/min.   Cranial nerves II - XII are as described above under HEENT exam.  Motor exam: Normal bulk, strength and tone is noted. There is no drift, or rebound. There is no resting tremor. There is a slight postural tremor, but no significant action tremor, no intention tremor.   Reflexes are 1+ in the upper extremities, trace in the lower extremities.  Fine motor skills are generally fairly well-preserved with no decrement in amplitude with hand movements or foot movements.   Cerebellar testing: No dysmetria or intention tremor on finger to nose testing. Heel to shin is unremarkable bilaterally. There is no truncal or gait ataxia. Sensory exam: intact to light touch. Gait, station and balance: She stands with mild difficulty and pushes herself up.  She has a slight forward lean, she uses a rolling walker.  She is mildly unsteady without the walker, no shuffling.    Assessment and Plan:  In summary, Kaavya A Clonch is a very pleasant 75 y.o.-year old female with an underlying medical history of reflux  disease, hyperlipidemia, depression, essential tremor, history of endometriosis and borderline overweight state, who presents for evaluation of her gait and balance disorder.  She has also had some memory loss over the past several months.  She has on examination a nonspecific gait disturbance with insecurity while walking.  Memory score with MMSE testing is abnormal.  She does have multiple vascular risk factors including hypertension, hyperlipidemia, and smoking.  She recently quit smoking.  We talked about the importance of healthy lifestyle.  She does not have evidence of parkinsonism.  She has orthostatic hypotension which is likely a contributor to balance problems, falls and her dizziness.  Sometimes she feels that the room is spinning but most of the time she feels dizzy when she stands up.  We talked about these different issues and contributors.  History is not suggestive for an underlying stroke.  Nevertheless, I would like to rule out a structural cause of her symptoms and she is advised to proceed with a brain MRI with and without contrast.  We will also do some blood work to look for reversible/treatable causes of her memory loss.  We will check vitamin B12, thyroid function, and other labs and call her with the results.  We will also call her with her brain MRI results.  For now, she is advised to  stay better hydrated with water, stand up slowly, talk to her about medication management including potentially reducing her beta-blocker as she has mild bradycardia and had orthostatic hypotension.  In addition, clonazepam can be a contributor to balance issues and cognitive sluggishness.  She is advised to talk to you about potentially reducing or eliminating the clonazepam slowly over time.  She has been on it for years.  We will also proceed with a carotid Doppler ultrasound.  We will plan a follow-up after testing.  We will keep her posted as to her results by phone call.  I answered all their questions  today and the patient and her friend were in agreement. Thank you very much for allowing me to participate in the care of this nice patient. If I can be of any further assistance to you please do not hesitate to call me at [phone].  Sincerely,   Lindsey Age, MD, PhD

## 2021-05-23 NOTE — Progress Notes (Signed)
Start time: 3:52 End time:  4:37  Virtual Visit via Telephone Note  I connected with Lindsey Frank, son of Lindsey Frank on 05/23/21 by telephone and verified that I am speaking with the correct person using two identifiers. Son reported inability to do video visit, preferred telephone call  Location: Patient: patient is at [facility].  Visit was with son, who was at home in [place] Provider: office   I discussed the limitations of evaluation and management by telemedicine and the availability of in person appointments. The patient expressed understanding and agreed to proceed.  History of Present Illness:  Chief Complaint  Patient presents with   Consult    PHONE CALL visit with son, Lindsey Frank and he wants to discuss patient's meds and visit with neuro rehab and neurology.    Saw neuro-rehab on Friday. They noted drop in blood pressure, lightheadedness with position changes. Son isn't sure why on blood pressure medication or clonazepam. He read through the notes from rehab as well as from neuro today.  He would like to get her off clonazepam, as suggested by neuro. She was noted to have MMSE 20/30, and abnormal animal naming.  She is going to be scheduled for carotid US and MRI. Labs were drawn to assess memory changes.  Son reports that he agrees she is likely dehydrated, contributing to lower BP.  He thinks part of the problem is that she has problems opening bottles.  He is planning to get her a water dispense for her room at [facility] to make it easier for her to drink mor ewater.  Discussed that orders from PT/OT came over to our office requesting 2x/week therapy.  He had been hoping to get daily PT, as he knows she won't do "homework" on the days the therapists don't come.  He is hoping she can get PT M-F, if possible. He states that she doesn't like being there but isn't fighting it. He states that she is no longer controlling her own meds, that she didn't "pass Lindsey Frank's tests".  The  family friend Lindsey Frank noted that Lindsey Frank's ankles are swelling. He is asking why.  Apparently she isn't complaining of pain in legs/calves.  PMH, PSH, SH reviewed  Outpatient Encounter Medications as of 05/23/2021  Medication Sig Note   Calcium Carbonate-Vitamin D 600-200 MG-UNIT TABS Take 600 mg by mouth 2 (two) times daily.    clonazePAM (KLONOPIN) 1 MG tablet Take 1 tablet (1 mg total) by mouth 2 (two) times daily.    nicotine (NICODERM CQ - DOSED IN MG/24 HR) 7 mg/24hr patch Place 1 patch (7 mg total) onto the skin daily.    omeprazole (PRILOSEC OTC) 20 MG tablet Take 20 mg by mouth daily.    propranolol ER (INDERAL LA) 80 MG 24 hr capsule Take 1 capsule (80 mg total) by mouth daily.    rosuvastatin (CRESTOR) 40 MG tablet Take 1 tablet (40 mg total) by mouth daily.    sertraline (ZOLOFT) 50 MG tablet Take 1 tablet (50 mg total) by mouth daily.    diphenhydramine-acetaminophen (TYLENOL PM) 25-500 MG TABS tablet Take 2 tablets by mouth at bedtime as needed. (Patient not taking: Reported on 05/23/2021) 08/16/2017: Uses prn, usually if has a headache when going to bed   No facility-administered encounter medications on file as of 05/23/2021.   Unable to perform ROS regarding patient, as just speaking to son.  But reportedly having some edema, moods Frank. Decreased memory and dizziness (orthostatic but also some intermittent vertigo) per  HPI.    Observations/Objective:  Ht 5\' 3"  (1.6 m)   Wt 144 lb (65.3 kg)   LMP 03/17/1999 (Approximate)   BMI 25.51 kg/m   Patient not on phone, but recent vitals reviewed, as pertinent to discussion. BP Readings from Last 3 Encounters:  05/18/21 110/70  05/11/21 138/80  03/24/21 118/72   Pulse Readings from Last 3 Encounters:  05/18/21 (!) 56  05/11/21 60  03/24/21 64     Assessment and Plan:  Multiple falls - orthostatic drop in blood pressure may be a contributing factor.  Lowering BB dose. PT working on drinking more water. to get PT at  [facility]  Benign essential tremor - BP and pulse low; will decrease propranolol dose to 60mg . She will work on drinking more water. Ultimately to try and taper clonazepam (change 1 med at a time) - Plan: propranolol ER (INDERAL LA) 60 MG 24 hr capsule   Expect tremor to worsen as BB is lowered, and eventually clonazepam also tapered down/off--to f/u with neuro if needed to address tremor/meds. Note sent to neuro  LMOVM (pt's cell) at 4:51 with med change recommendation. Had discussed with son during visit.    Follow Up Instructions:    I discussed the assessment and treatment plan with the patient. The patient was provided an opportunity to ask questions and all were answered. The patient agreed with the plan and demonstrated an understanding of the instructions.   The patient was advised to call back or seek an in-person evaluation if the symptoms worsen or if the condition fails to improve as anticipated.  I spent 55 minutes dedicated to the care of this patient, including pre-visit review of records, telephone time (on phone 45 minutes with son), post-visit ordering of testing/meds/orders to [facility], note to neuro and documentation.   ADDENDUM: Labs from neuro reviewed Lab Results  Component Value Date   WBC 8.2 05/23/2021   HGB 12.7 05/23/2021   HCT 39.9 05/23/2021   MCV 86 05/23/2021   PLT 203 05/23/2021   Lab Results  Component Value Date   VITAMINB12 271 05/23/2021   Lab Results  Component Value Date   FOLATE 7.7 05/23/2021   Lab Results  Component Value Date   TSH 2.410 05/23/2021   Lab Results  Component Value Date   ESRSEDRATE 2 05/23/2021   RPR nonreactive    Chemistry      Component Value Date/Time   NA 143 05/23/2021 1002   K 3.7 05/23/2021 1002   CL 102 05/23/2021 1002   CO2 24 05/23/2021 1002   BUN 17 05/23/2021 1002   CREATININE 0.92 05/23/2021 1002   CREATININE 0.91 08/16/2017 1112      Component Value Date/Time   CALCIUM 9.4  05/23/2021 1002   ALKPHOS 66 05/23/2021 1002   AST 12 05/23/2021 1002   ALT 8 05/23/2021 1002   BILITOT 0.4 05/23/2021 1002      Needs order to have orthostatic VS (BP and pulse, laying/sitting/standing) done 2-3 days after lower propranolol dose started, and recheck in 1 week, and to fax results. Needs order to start taking oral B12 vitamin 1000 mcg daily. Please send these orders to 07/23/2021, MD

## 2021-05-23 NOTE — Telephone Encounter (Signed)
Verbal orders given for 3 x a week for 4 weeks and then re-assess.

## 2021-05-23 NOTE — Patient Instructions (Addendum)
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 These factors include: normal aging, history of tremors which affects your head, possible atherosclerotic changes in the brain, we will do a brain MRI to confirm.  We will also look at your neck arteries with an ultrasound called carotid Doppler.  You may have dizziness because of blood pressure dropping when you stand up.  You did have a significant systolic blood pressure drop of 25 points.  Your heart rate is on the low end of normal and below normal as in below 60.  This is likely from taking a beta-blocker, namely your propranolol.  You may have to consider reducing the dose. Since you have had spinning sensation, and one-sided leaning.  It may be worthwhile getting checked out by ENT.  Please talk to Dr. Lynelle Doctor about a referral to a ear, nose and throat specialist.  Please remember to stand up slowly and get your bearings first turn slowly, no bending down to pick anything, no heavy lifting, be extra careful at night and first thing in the morning. Also, be careful in the Bathroom and the kitchen.   Remember to drink plenty of fluid, eat healthy meals and do not skip any meals. Try to eat protein with a every meal and eat a healthy snack such as fruit or nuts or yogurt in between meals. Try to keep a regular sleep-wake schedule and try to exercise daily, particularly in the form of walking, if you can. You should continue to use your walker at all times.  I recommend that you no longer drive.   We will continue to monitor your memory.  You do have vascular risk factors meaning that you could have memory issues from hardening of the arteries, your history does not suggest a strong family history of dementia such as Alzheimer's disease.   As far as your medications are concerned, I would like to suggest no new medications.  But I would like for you to talk to your primary care physician  about tapering the clonazepam slowly.  Using clonazepam can help the tremors but can also affect your balance over time and make you more sluggish in your thinking.  As far as diagnostic testing: I will order some blood work today.  Again, we will also proceed with a brain MRI and carotid ultrasound.  We will call you with the results and plan a follow-up after testing.

## 2021-05-24 ENCOUNTER — Encounter: Payer: Self-pay | Admitting: Family Medicine

## 2021-05-24 ENCOUNTER — Telehealth: Payer: Self-pay | Admitting: Family Medicine

## 2021-05-24 DIAGNOSIS — E781 Pure hyperglyceridemia: Secondary | ICD-10-CM | POA: Diagnosis not present

## 2021-05-24 DIAGNOSIS — R7301 Impaired fasting glucose: Secondary | ICD-10-CM | POA: Diagnosis not present

## 2021-05-24 DIAGNOSIS — K219 Gastro-esophageal reflux disease without esophagitis: Secondary | ICD-10-CM | POA: Diagnosis not present

## 2021-05-24 DIAGNOSIS — F411 Generalized anxiety disorder: Secondary | ICD-10-CM | POA: Diagnosis not present

## 2021-05-24 DIAGNOSIS — R69 Illness, unspecified: Secondary | ICD-10-CM | POA: Diagnosis not present

## 2021-05-24 DIAGNOSIS — G250 Essential tremor: Secondary | ICD-10-CM | POA: Diagnosis not present

## 2021-05-24 DIAGNOSIS — H8113 Benign paroxysmal vertigo, bilateral: Secondary | ICD-10-CM | POA: Diagnosis not present

## 2021-05-24 LAB — COMPREHENSIVE METABOLIC PANEL
ALT: 8 IU/L (ref 0–32)
AST: 12 IU/L (ref 0–40)
Albumin/Globulin Ratio: 2.7 — ABNORMAL HIGH (ref 1.2–2.2)
Albumin: 4.6 g/dL (ref 3.7–4.7)
Alkaline Phosphatase: 66 IU/L (ref 44–121)
BUN/Creatinine Ratio: 18 (ref 12–28)
BUN: 17 mg/dL (ref 8–27)
Bilirubin Total: 0.4 mg/dL (ref 0.0–1.2)
CO2: 24 mmol/L (ref 20–29)
Calcium: 9.4 mg/dL (ref 8.7–10.3)
Chloride: 102 mmol/L (ref 96–106)
Creatinine, Ser: 0.92 mg/dL (ref 0.57–1.00)
Globulin, Total: 1.7 g/dL (ref 1.5–4.5)
Glucose: 95 mg/dL (ref 65–99)
Potassium: 3.7 mmol/L (ref 3.5–5.2)
Sodium: 143 mmol/L (ref 134–144)
Total Protein: 6.3 g/dL (ref 6.0–8.5)
eGFR: 65 mL/min/{1.73_m2} (ref >59)

## 2021-05-24 LAB — CBC WITH DIFFERENTIAL/PLATELET
Basophils Absolute: 0.1 10*3/uL (ref 0.0–0.2)
Basos: 1 %
EOS (ABSOLUTE): 0.3 10*3/uL (ref 0.0–0.4)
Eos: 4 %
Hematocrit: 39.9 % (ref 34.0–46.6)
Hemoglobin: 12.7 g/dL (ref 11.1–15.9)
Immature Grans (Abs): 0 10*3/uL (ref 0.0–0.1)
Immature Granulocytes: 0 %
Lymphocytes Absolute: 2.3 10*3/uL (ref 0.7–3.1)
Lymphs: 28 %
MCH: 27.3 pg (ref 26.6–33.0)
MCHC: 31.8 g/dL (ref 31.5–35.7)
MCV: 86 fL (ref 79–97)
Monocytes Absolute: 0.6 10*3/uL (ref 0.1–0.9)
Monocytes: 8 %
Neutrophils Absolute: 4.9 10*3/uL (ref 1.4–7.0)
Neutrophils: 59 %
Platelets: 203 10*3/uL (ref 150–450)
RBC: 4.65 x10E6/uL (ref 3.77–5.28)
RDW: 13.3 % (ref 11.7–15.4)
WBC: 8.2 10*3/uL (ref 3.4–10.8)

## 2021-05-24 LAB — B12 AND FOLATE PANEL
Folate: 7.7 ng/mL (ref >3.0)
Vitamin B-12: 271 pg/mL (ref 232–1245)

## 2021-05-24 LAB — TSH: TSH: 2.41 u[IU]/mL (ref 0.450–4.500)

## 2021-05-24 LAB — SEDIMENTATION RATE: Sed Rate: 2 mm/hr (ref 0–40)

## 2021-05-24 LAB — RPR: RPR Ser Ql: NONREACTIVE

## 2021-05-24 NOTE — Telephone Encounter (Signed)
Sharyl Nimrod  with [facility] [phone]  Occupational therapy wants orders for therapy  Once a week for 2 weeks and then 2 times week for 2 weeks  address upper extremity strength and for and self care

## 2021-05-25 ENCOUNTER — Telehealth: Payer: Self-pay | Admitting: *Deleted

## 2021-05-25 DIAGNOSIS — H8113 Benign paroxysmal vertigo, bilateral: Secondary | ICD-10-CM | POA: Diagnosis not present

## 2021-05-25 DIAGNOSIS — G250 Essential tremor: Secondary | ICD-10-CM | POA: Diagnosis not present

## 2021-05-25 DIAGNOSIS — R69 Illness, unspecified: Secondary | ICD-10-CM | POA: Diagnosis not present

## 2021-05-25 DIAGNOSIS — R7301 Impaired fasting glucose: Secondary | ICD-10-CM | POA: Diagnosis not present

## 2021-05-25 DIAGNOSIS — K219 Gastro-esophageal reflux disease without esophagitis: Secondary | ICD-10-CM | POA: Diagnosis not present

## 2021-05-25 DIAGNOSIS — E559 Vitamin D deficiency, unspecified: Secondary | ICD-10-CM | POA: Diagnosis not present

## 2021-05-25 DIAGNOSIS — E781 Pure hyperglyceridemia: Secondary | ICD-10-CM | POA: Diagnosis not present

## 2021-05-25 DIAGNOSIS — E782 Mixed hyperlipidemia: Secondary | ICD-10-CM | POA: Diagnosis not present

## 2021-05-25 NOTE — Telephone Encounter (Signed)
Called pt's friend [name] (on [place]) and LVM asking for call back.

## 2021-05-25 NOTE — Telephone Encounter (Signed)
-----   Message from [name], MD sent at 05/24/2021  7:59 AM EDT ----- Please call patient or her friend, Lindsey Frank, on [place], and advised them that her blood test results were benign.  She did have a low normal vitamin B12 level and it is worth rechecking with her primary care physician in 3 to 6 months.  She can follow-up as scheduled.

## 2021-05-26 ENCOUNTER — Telehealth: Payer: Self-pay | Admitting: *Deleted

## 2021-05-26 ENCOUNTER — Telehealth: Payer: Self-pay | Admitting: Neurology

## 2021-05-26 DIAGNOSIS — G250 Essential tremor: Secondary | ICD-10-CM | POA: Diagnosis not present

## 2021-05-26 DIAGNOSIS — R7301 Impaired fasting glucose: Secondary | ICD-10-CM | POA: Diagnosis not present

## 2021-05-26 DIAGNOSIS — R69 Illness, unspecified: Secondary | ICD-10-CM | POA: Diagnosis not present

## 2021-05-26 DIAGNOSIS — K219 Gastro-esophageal reflux disease without esophagitis: Secondary | ICD-10-CM | POA: Diagnosis not present

## 2021-05-26 DIAGNOSIS — H8113 Benign paroxysmal vertigo, bilateral: Secondary | ICD-10-CM | POA: Diagnosis not present

## 2021-05-26 DIAGNOSIS — E781 Pure hyperglyceridemia: Secondary | ICD-10-CM | POA: Diagnosis not present

## 2021-05-26 NOTE — Telephone Encounter (Signed)
I spoke to the patient. She verbalized understanding of the lab findings. Agreeable to follow up w/ PCP concerning B12.

## 2021-05-26 NOTE — Telephone Encounter (Signed)
[employer] medicare no auth require spoke to [facility] ref # [id number] sent message to Lindsey Frank at [address] [facility] cone they will reach out to the patient to schedule.

## 2021-05-26 NOTE — Telephone Encounter (Signed)
-----   Message from Saima Athar, MD sent at 05/24/2021  7:59 AM EDT ----- Please call patient or her friend, [name], on DPR, and advised them that her blood test results were benign.  She did have a low normal vitamin B12 level and it is worth rechecking with her primary care physician in 3 to 6 months.  She can follow-up as scheduled. 

## 2021-05-27 DIAGNOSIS — R69 Illness, unspecified: Secondary | ICD-10-CM | POA: Diagnosis not present

## 2021-05-27 DIAGNOSIS — E559 Vitamin D deficiency, unspecified: Secondary | ICD-10-CM | POA: Diagnosis not present

## 2021-05-27 DIAGNOSIS — E781 Pure hyperglyceridemia: Secondary | ICD-10-CM | POA: Diagnosis not present

## 2021-05-27 DIAGNOSIS — R7301 Impaired fasting glucose: Secondary | ICD-10-CM | POA: Diagnosis not present

## 2021-05-27 DIAGNOSIS — E119 Type 2 diabetes mellitus without complications: Secondary | ICD-10-CM | POA: Diagnosis not present

## 2021-05-27 DIAGNOSIS — G250 Essential tremor: Secondary | ICD-10-CM | POA: Diagnosis not present

## 2021-05-27 DIAGNOSIS — E782 Mixed hyperlipidemia: Secondary | ICD-10-CM | POA: Diagnosis not present

## 2021-05-27 DIAGNOSIS — I10 Essential (primary) hypertension: Secondary | ICD-10-CM | POA: Diagnosis not present

## 2021-05-27 DIAGNOSIS — E039 Hypothyroidism, unspecified: Secondary | ICD-10-CM | POA: Diagnosis not present

## 2021-05-27 DIAGNOSIS — K219 Gastro-esophageal reflux disease without esophagitis: Secondary | ICD-10-CM | POA: Diagnosis not present

## 2021-05-27 DIAGNOSIS — H8113 Benign paroxysmal vertigo, bilateral: Secondary | ICD-10-CM | POA: Diagnosis not present

## 2021-05-30 ENCOUNTER — Emergency Department (HOSPITAL_BASED_OUTPATIENT_CLINIC_OR_DEPARTMENT_OTHER)
Admission: EM | Admit: 2021-05-30 | Discharge: 2021-05-31 | Disposition: A | Discharge status: Home / Self Care | Payer: Medicare HMO | Attending: Emergency Medicine | Admitting: Emergency Medicine

## 2021-05-30 ENCOUNTER — Other Ambulatory Visit: Payer: Self-pay

## 2021-05-30 ENCOUNTER — Encounter (HOSPITAL_BASED_OUTPATIENT_CLINIC_OR_DEPARTMENT_OTHER): Payer: Self-pay

## 2021-05-30 ENCOUNTER — Emergency Department (HOSPITAL_BASED_OUTPATIENT_CLINIC_OR_DEPARTMENT_OTHER): Payer: Medicare HMO

## 2021-05-30 DIAGNOSIS — Y92129 Unspecified place in nursing home as the place of occurrence of the external cause: Secondary | ICD-10-CM | POA: Diagnosis not present

## 2021-05-30 DIAGNOSIS — G250 Essential tremor: Secondary | ICD-10-CM | POA: Diagnosis not present

## 2021-05-30 DIAGNOSIS — W19XXXA Unspecified fall, initial encounter: Secondary | ICD-10-CM

## 2021-05-30 DIAGNOSIS — R69 Illness, unspecified: Secondary | ICD-10-CM | POA: Diagnosis not present

## 2021-05-30 DIAGNOSIS — R42 Dizziness and giddiness: Secondary | ICD-10-CM | POA: Insufficient documentation

## 2021-05-30 DIAGNOSIS — Z87891 Personal history of nicotine dependence: Secondary | ICD-10-CM | POA: Insufficient documentation

## 2021-05-30 DIAGNOSIS — R7301 Impaired fasting glucose: Secondary | ICD-10-CM | POA: Diagnosis not present

## 2021-05-30 DIAGNOSIS — Z043 Encounter for examination and observation following other accident: Secondary | ICD-10-CM | POA: Diagnosis not present

## 2021-05-30 DIAGNOSIS — T1490XA Injury, unspecified, initial encounter: Secondary | ICD-10-CM | POA: Diagnosis not present

## 2021-05-30 DIAGNOSIS — R531 Weakness: Secondary | ICD-10-CM | POA: Diagnosis not present

## 2021-05-30 DIAGNOSIS — E781 Pure hyperglyceridemia: Secondary | ICD-10-CM | POA: Diagnosis not present

## 2021-05-30 DIAGNOSIS — G319 Degenerative disease of nervous system, unspecified: Secondary | ICD-10-CM | POA: Diagnosis not present

## 2021-05-30 DIAGNOSIS — S0990XA Unspecified injury of head, initial encounter: Secondary | ICD-10-CM | POA: Diagnosis not present

## 2021-05-30 DIAGNOSIS — R001 Bradycardia, unspecified: Secondary | ICD-10-CM | POA: Diagnosis not present

## 2021-05-30 DIAGNOSIS — H8113 Benign paroxysmal vertigo, bilateral: Secondary | ICD-10-CM | POA: Diagnosis not present

## 2021-05-30 DIAGNOSIS — Z743 Need for continuous supervision: Secondary | ICD-10-CM | POA: Diagnosis not present

## 2021-05-30 DIAGNOSIS — K219 Gastro-esophageal reflux disease without esophagitis: Secondary | ICD-10-CM | POA: Diagnosis not present

## 2021-05-30 LAB — BASIC METABOLIC PANEL
Anion gap: 6 (ref 5–15)
BUN: 16 mg/dL (ref 8–23)
CO2: 30 mmol/L (ref 22–32)
Calcium: 9.2 mg/dL (ref 8.9–10.3)
Chloride: 104 mmol/L (ref 98–111)
Creatinine, Ser: 0.98 mg/dL (ref 0.44–1.00)
GFR, Estimated: >60 mL/min (ref >60)
Glucose, Bld: 103 mg/dL — ABNORMAL HIGH (ref 70–99)
Potassium: 3.8 mmol/L (ref 3.5–5.1)
Sodium: 140 mmol/L (ref 135–145)

## 2021-05-30 LAB — CBC
HCT: 37.7 % (ref 36.0–46.0)
Hemoglobin: 12.6 g/dL (ref 12.0–15.0)
MCH: 28.3 pg (ref 26.0–34.0)
MCHC: 33.4 g/dL (ref 30.0–36.0)
MCV: 84.7 fL (ref 80.0–100.0)
Platelets: 190 10*3/uL (ref 150–400)
RBC: 4.45 MIL/uL (ref 3.87–5.11)
RDW: 13.9 % (ref 11.5–15.5)
WBC: 7.6 10*3/uL (ref 4.0–10.5)
nRBC: 0 % (ref 0.0–0.2)

## 2021-05-30 LAB — CBG MONITORING, ED: Glucose-Capillary: 106 mg/dL — ABNORMAL HIGH (ref 70–99)

## 2021-05-30 NOTE — Discharge Instructions (Addendum)
The imaging of your head shows no bleeding.  Move slowly from sitting to standing.  When getting out of bed sit up and see if you are dizzy before standing up.   Return if you begin to have headaches, visual changes, nausea, vomiting.   Please follow-up with your pcp/cardiologist about your slow heart rate.  Stop taking your propanolol for the next 2 days and see how you feel.

## 2021-05-30 NOTE — ED Triage Notes (Signed)
Per ems: pt is from [place] [facility]; reports she felt dizzy when she went to open the refrigerator door and fell down and hit her head. She states she was walking without her walker. No blood thinners, no LOC. Pt A&OX4. Reports multiple falls.

## 2021-05-30 NOTE — ED Triage Notes (Signed)
Per ems pt is from [facility] [place] pt wast getting something out from mini fridge and fell forward. Pt hit head on carpeted floor. No blood thinners. Uses walker. Has Hx of fall.

## 2021-05-30 NOTE — ED Provider Notes (Signed)
[facility] EMERGENCY DEPARTMENT Provider Note   CSN: [id number] Arrival date & time: 05/30/21  2126     History Chief Complaint  Patient presents with   Fall   Dizziness    Lindsey Frank is a 75 y.o. female with a past medical history of migraines and essential tremor who presents today after fall at her assisted living.  She states that she was in her kitchen and opened her refrigerator quickly and it hit her on the left side of her head and she fell down onto the carpet.  She says that she hit the left side of her head, but did not lose consciousness.  Does not have any headaches, dizziness, nausea, vomiting.  Does not complain of any other pain.  Has not noticed any bruising.  States that she has vertigo that "somebody really needs to help her fix."  Says that she sometimes gets woozy when she sits up in bed or stands up too quickly.  Reports that this evening the nursing staff came and checked on her and all of her vital signs were stable.  They gave her the option to come to the emergency department or stay at her facility, however she said that she would come here and get checked out because she has a friend who passed away from a head bleed after a fall.   Patient speech somewhat garbled and slurred on evaluation.  Unsure if this is her baseline, or possible effects of her tremor. Reports continuous use of her clonazepam, including this am prior to her fall.  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 Dagoberto Ligas   COSMETIC SURGERY  2008   neck lift   LAPAROSCOPY  1981   endometriosis   TONSILECTOMY, ADENOIDECTOMY, BILATERAL MYRINGOTOMY AND TUBES  1953   age 87   UTERINE FIBROID SURGERY  1977   with appendectomy     OB History     Gravida  2   Para  1   Term  1   Preterm      AB  1   Living  1      SAB  1   IAB      Ectopic      Multiple      Live Births  1           Family History  Problem Relation Age of Onset   Diabetes Mother    Heart disease Father 86   Stroke Father 29   Throat cancer Father  Depression Brother    Stomach cancer Maternal Aunt    Depression Maternal Uncle        suicide   Alzheimer's disease Cousin     Social History   Tobacco Use   Smoking status: Former    Packs/day: 0.25    Years: 30.00    Pack years: 7.50    Types: Cigarettes   Smokeless tobacco: Never   Tobacco comments:    quit 12/2014, relapsed with stress, quit again end of July 2016  Vaping Use   Vaping Use: Never used  Substance Use Topics   Alcohol use: Yes    Alcohol/week: 0.0 - 1.0 standard drinks    Comment: (occasional Tia Maria), occasionally on weekends   Drug use: No    Home Medications Prior to Admission medications   Medication Sig Start Date End Date Taking? Authorizing Provider  Calcium Carbonate-Vitamin D 600-200 MG-UNIT TABS Take 600 mg by mouth 2 (two) times daily.    [provider]  clonazePAM (KLONOPIN) 1 MG tablet Take 1 tablet (1 mg total) by mouth 2 (two) times daily. 03/24/21    [name], MD  diphenhydramine-acetaminophen (TYLENOL PM) 25-500 MG TABS tablet Take 2 tablets by mouth at bedtime as needed. Patient not taking: Reported on 05/23/2021    [provider]  nicotine (NICODERM CQ - DOSED IN MG/24 HR) 7 mg/24hr patch Place 1 patch (7 mg total) onto the skin daily. 05/19/21   [name], MD  omeprazole (PRILOSEC OTC) 20 MG tablet Take 20 mg by mouth daily.    [provider]  propranolol ER (INDERAL LA) 60 MG 24 hr capsule Take 1 capsule (60 mg total) by mouth daily. 05/23/21   [name], MD  rosuvastatin (CRESTOR) 40 MG tablet Take 1 tablet (40 mg total) by mouth daily. 09/15/20   [name], MD  sertraline (ZOLOFT) 50 MG tablet Take 1 tablet (50 mg total) by mouth daily. 09/15/20   [name], MD    Allergies    Trazodone and nefazodone and Mysoline [primidone]  Review of Systems   Review of Systems  Constitutional:  Negative for chills, fatigue and fever.  HENT:  Negative for nosebleeds and tinnitus.   Eyes:  Negative for photophobia and pain.  Respiratory:  Negative for chest tightness, shortness of breath and wheezing.   Cardiovascular:  Negative for chest pain and palpitations.  Gastrointestinal:  Negative for abdominal pain, constipation, diarrhea and vomiting.  Musculoskeletal:  Negative for back pain, neck pain and neck stiffness.  Skin:  Negative for color change.  Neurological:  Positive for dizziness. Negative for weakness, light-headedness, numbness and headaches.  Psychiatric/Behavioral:  Negative for confusion.   All other systems reviewed and are negative.  Physical Exam Updated Vital Signs BP 134/75 (BP Location: Right Arm)   Pulse (!) 51   Temp 97.8 F (36.6 C) (Oral)   Resp 17   Ht 5\' 3"  (1.6 m)   Wt 64.4 kg   LMP 03/17/1999 (Approximate)   SpO2 96%   BMI 25.15 kg/m   Physical Exam Vitals and nursing note reviewed.  Constitutional:      Appearance: Normal appearance.  HENT:     Head: Normocephalic and  atraumatic.  Eyes:     General: No scleral icterus.    Conjunctiva/sclera: Conjunctivae normal.  Cardiovascular:     Rate and Rhythm: Regular rhythm. Bradycardia present.     Heart sounds: No murmur heard. Pulmonary:     Effort: Pulmonary effort is normal.  No respiratory distress.  Abdominal:     General: Abdomen is flat.     Palpations: Abdomen is soft.     Tenderness: There is no abdominal tenderness.  Musculoskeletal:        General: No swelling or tenderness. Normal range of motion.     Cervical back: Normal range of motion. No tenderness.     Comments: Range of motion normal in all extremities. 5 out of 5 strength bilaterally.  Mild tremor noticed.  Skin:    General: Skin is warm and dry.     Findings: No bruising or lesion.  Neurological:     Mental Status: She is alert.     Motor: No weakness.     Coordination: Coordination normal.  Psychiatric:        Mood and Affect: Mood normal.    ED Results / Procedures / Treatments   Labs (all labs ordered are listed, but only abnormal results are displayed) Labs Reviewed  BASIC METABOLIC PANEL - Abnormal; Notable for the following components:      Result Value   Glucose, Bld 103 (*)    All other components within normal limits  CBG MONITORING, ED - Abnormal; Notable for the following components:   Glucose-Capillary 106 (*)    All other components within normal limits  CBC  URINALYSIS, ROUTINE W REFLEX MICROSCOPIC    EKG EKG Interpretation  Date/Time:  Monday May 30 2021 21:36:28 EDT Ventricular Rate:  51 PR Interval:  166 QRS Duration: 107 QT Interval:  526 QTC Calculation: 485 R Axis:   49 Text Interpretation: Sinus rhythm Ventricular premature complex Low voltage, extremity and precordial leads Confirmed by Vanetta MuldersZackowski, Scott [phone]([id number]) on 05/30/2021 9:48:08 PM  Radiology CT HEAD WO CONTRAST (5MM)  Result Date: 05/30/2021 CLINICAL DATA:  Fall EXAM: CT HEAD WITHOUT CONTRAST TECHNIQUE: Contiguous axial images were  obtained from the base of the skull through the vertex without intravenous contrast. COMPARISON:  None. FINDINGS: Brain: There is no mass, hemorrhage or extra-axial collection. The appearance of the white matter is normal for the patient's age. There is age-advanced atrophy. Vascular: No abnormal hyperdensity of the major intracranial arteries or dural venous sinuses. No intracranial atherosclerosis. Skull: The visualized skull base, calvarium and extracranial soft tissues are normal. Sinuses/Orbits: No fluid levels or advanced mucosal thickening of the visualized paranasal sinuses. No mastoid or middle ear effusion. The orbits are normal. IMPRESSION: 1. No acute intracranial abnormality. 2. 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 Electronically Signed   By: Deatra RobinsonKevin  Herman M.D.   On: 05/30/2021 22:52    Procedures Procedures   Medications Ordered in ED Medications - No data to display  ED Course  I have reviewed the triage vital signs and the nursing notes.  Pertinent labs & imaging results that were available during my care of the patient were reviewed by me and considered in my medical decision making (see chart for details).  Patient evaluated by me at bedside and showed no acute distress.  Noted to be bradycardic between 48 and 54.  She states that her heart rate sometimes is low and sometimes is high.  Denies any dizziness, headache, lightheadedness, changes in her vision at this time.  Reports concern surrounding "bleeding in her head."  I will order a CT head due to her injury and concern.   MDM Rules/Calculators/A&P                         Patient is a 75 year old female who  presented this evening after a fall in her assisted living.  She fell from standing position to the floor and hit her head on the carpet.  Fall was caused by refrigerator door hitting her.  Upon physical exam patient had no acute complaints.  She only stated that she was concerned for internal bleeding.  Normal neurological exam and  range of motion in all extremities.  No signs of bruising, injury, or deformity.  I ordered the head CT due to report of hitting her left head around her temporal skull.  The head CT revealed no abnormalities.  Negative for intracranial hemorrhage. I have concern that her dizziness and falls may be associated with her bradycardia. Could also be her clonazepam use. Patient has an appointment with her PCP on Wednesday and will discuss these two medications and her fall.   Patient denies dizziness, lightheadedness or feelings of presyncope.  Was able to ambulate to the bathroom with her walker.  Her blood work was unremarkable.  EKG showed sinus bradycardia with a PVC.  Would like her to discuss her heart rate with her PCP and cardiologist.  Patient voiced understanding and will hold her propranolol until she sees her primary care provider on Wednesday.  Agreed to stop Klonopin use until this appointment as well.  Patient stable and agreeable to discharge.  Assisted living facility will pick her up and monitor her symptoms.  Return precautions discussed and patient voiced standing.  Final Clinical Impression(s) / ED Diagnoses Final diagnoses:  Fall, initial encounter  Bradycardia    Rx / DC Orders Results and diagnoses were explained to the patient. Return precautions discussed in full. Patient had no additional questions and expressed complete understanding.     Woodroe Chen 05/30/21 2359    Vanetta Mulders, MD 06/03/21 2303

## 2021-05-31 DIAGNOSIS — Z743 Need for continuous supervision: Secondary | ICD-10-CM | POA: Diagnosis not present

## 2021-05-31 DIAGNOSIS — R5381 Other malaise: Secondary | ICD-10-CM | POA: Diagnosis not present

## 2021-05-31 DIAGNOSIS — R69 Illness, unspecified: Secondary | ICD-10-CM | POA: Diagnosis not present

## 2021-05-31 DIAGNOSIS — R7301 Impaired fasting glucose: Secondary | ICD-10-CM | POA: Diagnosis not present

## 2021-05-31 DIAGNOSIS — H8113 Benign paroxysmal vertigo, bilateral: Secondary | ICD-10-CM | POA: Diagnosis not present

## 2021-05-31 DIAGNOSIS — K219 Gastro-esophageal reflux disease without esophagitis: Secondary | ICD-10-CM | POA: Diagnosis not present

## 2021-05-31 DIAGNOSIS — G250 Essential tremor: Secondary | ICD-10-CM | POA: Diagnosis not present

## 2021-05-31 DIAGNOSIS — E781 Pure hyperglyceridemia: Secondary | ICD-10-CM | POA: Diagnosis not present

## 2021-05-31 DIAGNOSIS — R531 Weakness: Secondary | ICD-10-CM | POA: Diagnosis not present

## 2021-05-31 NOTE — Progress Notes (Signed)
Chief Complaint  Patient presents with   other    Vertigo for a couple of months getting a little bit better, causing her to fall more recently, 3 falls in the last week     Patient presents with complaint of vertigo and f/u ER visit from a fall on 8/15. She is accompanied by her friend Dominica Severin.  She was seen in ER 8/15 after a fall at Assisted Living. According to the ER notes, "she was in her kitchen, opened her refrigerator quickly and it hit her on the left side of her head and she fell down onto the carpet.  She says that she hit the left side of her head, but did not lose consciousness. She sometimes gets woozy when she sits up in bed or stands up too quickly. Patient speech somewhat garbled and slurred on evaluation."  Patient states that she opened the refrigerator, which triggered vertigo, and it "pushed her back down to the floor".  Didn't last long.  She had normal CBC, b-met. No acute changes on head CT.  IMPRESSION: 1. No acute intracranial abnormality. 2. 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  Found to be bradycardic.  Pulse Readings from Last 3 Encounters:  05/31/21 (!) 49  05/18/21 (!) 56  05/11/21 60   BP Readings from Last 3 Encounters:  05/31/21 118/66  05/18/21 110/70  05/11/21 138/80   Advised to hold her propranolol and clonazepam until f/u with PCP. While Nanine Means has been giving these to her, she hasn't taken them. She seems stronger and more balanced per friend. Seemed weak, balance was off prior to ER visit No change in tremor noted since not taking propranolol or klonopin. "I feel great"  She continues to report some vertigo, had some dizziness, short-lived with walking yesterday. Does report short-lived dizziness with sitting up.  Her dose of propranolol was decreased from 80 to 81m last week related to orthostatic BP's noted at neuro-rehab. She was also started on oral B12, due to borderline values per neurology's testing. It was suggested by neuro that she be  weaned off of clonazepam, which was discussed with son last week--wanting to make one change at a time, and BB dose just lowered.  She got a water cooler in her room, and she is drinking a lot more water. Had some incontinence at first, resolved.  [facility]'t like the staff, they don't know what they're doing. She described an episode where she had slid down in her room, got her head stuck between bed frame and table. Pulled call bell, but nobody came.  She reports she yelled help for an hour Eventually 2 girls came, helped free head, left her on the floor.  PMH, PSH, SH reviewed  Outpatient Encounter Medications as of 06/01/2021  Medication Sig Note   Calcium Carbonate-Vitamin D 600-200 MG-UNIT TABS Take 600 mg by mouth 2 (two) times daily.    nicotine (NICODERM CQ - DOSED IN MG/24 HR) 7 mg/24hr patch Place 1 patch (7 mg total) onto the skin daily.    omeprazole (PRILOSEC OTC) 20 MG tablet Take 20 mg by mouth daily.    rosuvastatin (CRESTOR) 40 MG tablet Take 1 tablet (40 mg total) by mouth daily.    sertraline (ZOLOFT) 50 MG tablet Take 1 tablet (50 mg total) by mouth daily.    clonazePAM (KLONOPIN) 1 MG tablet Take 1 tablet (1 mg total) by mouth 2 (two) times daily. (Patient not taking: Reported on 06/01/2021) 06/01/2021: Hasn't taken today  or yesterday   diphenhydramine-acetaminophen (TYLENOL PM) 25-500 MG TABS tablet Take 2 tablets by mouth at bedtime as needed. (Patient not taking: No sig reported) 08/16/2017: Uses prn, usually if has a headache when going to bed   propranolol ER (INDERAL LA) 60 MG 24 hr capsule Take 1 capsule (60 mg total) by mouth daily. (Patient not taking: Reported on 06/01/2021) 06/01/2021: Hasn't taken since ER visit (2d ago)   No facility-administered encounter medications on file as of 06/01/2021.   Unsure if taking B12 (was prescribed last week)  Took dramamine in the past, didn't help Hasn't used meclizine recently (was ordered  for prn use for vertigo).  Allergies  Allergen Reactions   Trazodone And Nefazodone Nausea Only   Mysoline [Primidone] Nausea Only    Nausea and achey   ROS: Denies fever, chills, URI symptoms, headaches, shortness of breath, chest pain.  Denies nausea, vomiting, bowel changes, urinary complaints, bleeding, bruising, rash. +dizziness/vertigo and falls per HPI. No worsening of tremor.  More alert and clearer per friend,   PHYSICAL EXAM:  BP 124/80   Pulse 66   Temp 97.6 F (36.4 C)   Wt 143 lb 3.2 oz (65 kg)   LMP 03/17/1999 (Approximate)   BMI 25.37 kg/m   Pleasant female, more alert, conversant and memory seemed improved today. HEENT: conjunctiva and sclera are clear, EOMI, wearing mask. Head atraumatic Neck: no lymphadenopathy ro mass Heart: regular rate and rhythm Lungs: clear bilaterally Abdomen: soft, nontender Neuro: alert, oriented.  Walking with walker. No dizziness or vertigo with position changes, or with standing.  Pulse increased by 10 from laying to standing. No change in BP with position changes (see extended VS)  ASSESSMENT/PLAN:  Benign essential tremor - so far unchanged despite stopping propranolol and klonopin since ER. Will cont taper off klonopin, remain off BB  Multiple falls - suspect orthostasis contributing poss also bradycardia. BP/pulse improved since stopping BB. Poss vertigo also. Cont with increased water intake. cont PT  Confusion - This is much better today.  d/c am dose of klonopin, and will taper off evening (to prevent any kind of w/d, given length of time taking)  Vertigo - sx are short-lived, discussed moving slowly. Suspect component of orthostasis, improved with increased fluids and stopping BB  Bradycardia - poss also contributing to dizziness.  BB held and pulse is better.  d/c propranolol   Please bring a current list of medications from [facility] for each visit. I believe you should be taking a B12 vitamin daily. We had  lowered your propranolol to 45m last week, but are now stopping it completely. We are also making changes to your clonazepam.  We are stopping the daytime dose. We are keeping the evening dose for another week, then will cut the evening dose in half.  We will try and cut that back further after 2 weeks.  Continue to drink plenty of water (goal is having very pale urine).  Return in 2 weeks for another visit.

## 2021-05-31 NOTE — ED Notes (Signed)
Pt ambulatory to restroom with walker with standby assist.

## 2021-05-31 NOTE — ED Notes (Signed)
Report given to Lauri at [facility].

## 2021-06-01 ENCOUNTER — Encounter: Payer: Self-pay | Admitting: Family Medicine

## 2021-06-01 ENCOUNTER — Ambulatory Visit (INDEPENDENT_AMBULATORY_CARE_PROVIDER_SITE_OTHER): Payer: Medicare HMO | Admitting: Family Medicine

## 2021-06-01 ENCOUNTER — Other Ambulatory Visit: Payer: Self-pay

## 2021-06-01 VITALS — BP 124/80 | HR 66 | Temp 97.6°F | Wt 143.2 lb

## 2021-06-01 DIAGNOSIS — R42 Dizziness and giddiness: Secondary | ICD-10-CM | POA: Diagnosis not present

## 2021-06-01 DIAGNOSIS — K219 Gastro-esophageal reflux disease without esophagitis: Secondary | ICD-10-CM | POA: Diagnosis not present

## 2021-06-01 DIAGNOSIS — R296 Repeated falls: Secondary | ICD-10-CM | POA: Diagnosis not present

## 2021-06-01 DIAGNOSIS — E781 Pure hyperglyceridemia: Secondary | ICD-10-CM | POA: Diagnosis not present

## 2021-06-01 DIAGNOSIS — H8113 Benign paroxysmal vertigo, bilateral: Secondary | ICD-10-CM | POA: Diagnosis not present

## 2021-06-01 DIAGNOSIS — R7301 Impaired fasting glucose: Secondary | ICD-10-CM | POA: Diagnosis not present

## 2021-06-01 DIAGNOSIS — R410 Disorientation, unspecified: Secondary | ICD-10-CM | POA: Diagnosis not present

## 2021-06-01 DIAGNOSIS — G250 Essential tremor: Secondary | ICD-10-CM

## 2021-06-01 DIAGNOSIS — R001 Bradycardia, unspecified: Secondary | ICD-10-CM | POA: Diagnosis not present

## 2021-06-01 DIAGNOSIS — R69 Illness, unspecified: Secondary | ICD-10-CM | POA: Diagnosis not present

## 2021-06-01 NOTE — Patient Instructions (Signed)
Please bring a current list of medications from [facility] for each visit. I believe you should be taking a B12 vitamin daily. We had lowered your propranolol to 60mg  last week, but are now stopping it completely. We are also making changes to your clonazepam.  We are stopping the daytime dose. We are keeping the evening dose for another week, then will cut the evening dose in half.  We will try and cut that back further after 2 weeks.  Continue to drink plenty of water (goal is having very pale urine).  Return in 2 weeks for another visit.

## 2021-06-02 DIAGNOSIS — G250 Essential tremor: Secondary | ICD-10-CM | POA: Diagnosis not present

## 2021-06-02 DIAGNOSIS — E782 Mixed hyperlipidemia: Secondary | ICD-10-CM | POA: Diagnosis not present

## 2021-06-02 DIAGNOSIS — K219 Gastro-esophageal reflux disease without esophagitis: Secondary | ICD-10-CM | POA: Diagnosis not present

## 2021-06-03 DIAGNOSIS — H8113 Benign paroxysmal vertigo, bilateral: Secondary | ICD-10-CM | POA: Diagnosis not present

## 2021-06-03 DIAGNOSIS — R69 Illness, unspecified: Secondary | ICD-10-CM | POA: Diagnosis not present

## 2021-06-03 DIAGNOSIS — R7301 Impaired fasting glucose: Secondary | ICD-10-CM | POA: Diagnosis not present

## 2021-06-03 DIAGNOSIS — E781 Pure hyperglyceridemia: Secondary | ICD-10-CM | POA: Diagnosis not present

## 2021-06-03 DIAGNOSIS — G250 Essential tremor: Secondary | ICD-10-CM | POA: Diagnosis not present

## 2021-06-03 DIAGNOSIS — K219 Gastro-esophageal reflux disease without esophagitis: Secondary | ICD-10-CM | POA: Diagnosis not present

## 2021-06-06 ENCOUNTER — Ambulatory Visit (HOSPITAL_COMMUNITY): Payer: Medicare HMO

## 2021-06-06 DIAGNOSIS — R69 Illness, unspecified: Secondary | ICD-10-CM | POA: Diagnosis not present

## 2021-06-06 DIAGNOSIS — E781 Pure hyperglyceridemia: Secondary | ICD-10-CM | POA: Diagnosis not present

## 2021-06-06 DIAGNOSIS — G250 Essential tremor: Secondary | ICD-10-CM | POA: Diagnosis not present

## 2021-06-06 DIAGNOSIS — H8113 Benign paroxysmal vertigo, bilateral: Secondary | ICD-10-CM | POA: Diagnosis not present

## 2021-06-06 DIAGNOSIS — K219 Gastro-esophageal reflux disease without esophagitis: Secondary | ICD-10-CM | POA: Diagnosis not present

## 2021-06-06 DIAGNOSIS — R7301 Impaired fasting glucose: Secondary | ICD-10-CM | POA: Diagnosis not present

## 2021-06-06 DIAGNOSIS — I739 Peripheral vascular disease, unspecified: Secondary | ICD-10-CM | POA: Diagnosis not present

## 2021-06-07 ENCOUNTER — Other Ambulatory Visit: Payer: Self-pay

## 2021-06-07 ENCOUNTER — Ambulatory Visit
Admission: RE | Admit: 2021-06-07 | Discharge: 2021-06-07 | Disposition: A | Discharge status: Home / Self Care | Payer: Medicare HMO | Source: Ambulatory Visit | Attending: Neurology | Admitting: Neurology

## 2021-06-07 DIAGNOSIS — I951 Orthostatic hypotension: Secondary | ICD-10-CM

## 2021-06-07 DIAGNOSIS — R69 Illness, unspecified: Secondary | ICD-10-CM | POA: Diagnosis not present

## 2021-06-07 DIAGNOSIS — H8113 Benign paroxysmal vertigo, bilateral: Secondary | ICD-10-CM | POA: Diagnosis not present

## 2021-06-07 DIAGNOSIS — G250 Essential tremor: Secondary | ICD-10-CM | POA: Diagnosis not present

## 2021-06-07 DIAGNOSIS — E781 Pure hyperglyceridemia: Secondary | ICD-10-CM | POA: Diagnosis not present

## 2021-06-07 DIAGNOSIS — R296 Repeated falls: Secondary | ICD-10-CM

## 2021-06-07 DIAGNOSIS — K219 Gastro-esophageal reflux disease without esophagitis: Secondary | ICD-10-CM | POA: Diagnosis not present

## 2021-06-07 DIAGNOSIS — R42 Dizziness and giddiness: Secondary | ICD-10-CM | POA: Diagnosis not present

## 2021-06-07 DIAGNOSIS — R7301 Impaired fasting glucose: Secondary | ICD-10-CM | POA: Diagnosis not present

## 2021-06-07 DIAGNOSIS — F411 Generalized anxiety disorder: Secondary | ICD-10-CM | POA: Diagnosis not present

## 2021-06-07 DIAGNOSIS — R413 Other amnesia: Secondary | ICD-10-CM | POA: Diagnosis not present

## 2021-06-07 MED ORDER — GADOBENATE DIMEGLUMINE 529 MG/ML IV SOLN
13.0000 mL | Freq: Once | INTRAVENOUS | Status: AC | PRN
  Administered 2021-06-07: 13 mL via INTRAVENOUS

## 2021-06-08 DIAGNOSIS — H8113 Benign paroxysmal vertigo, bilateral: Secondary | ICD-10-CM | POA: Diagnosis not present

## 2021-06-08 DIAGNOSIS — K219 Gastro-esophageal reflux disease without esophagitis: Secondary | ICD-10-CM | POA: Diagnosis not present

## 2021-06-08 DIAGNOSIS — E781 Pure hyperglyceridemia: Secondary | ICD-10-CM | POA: Diagnosis not present

## 2021-06-08 DIAGNOSIS — R69 Illness, unspecified: Secondary | ICD-10-CM | POA: Diagnosis not present

## 2021-06-08 DIAGNOSIS — G250 Essential tremor: Secondary | ICD-10-CM | POA: Diagnosis not present

## 2021-06-08 DIAGNOSIS — R7301 Impaired fasting glucose: Secondary | ICD-10-CM | POA: Diagnosis not present

## 2021-06-09 ENCOUNTER — Other Ambulatory Visit: Payer: Self-pay

## 2021-06-09 ENCOUNTER — Ambulatory Visit (HOSPITAL_COMMUNITY)
Admission: RE | Admit: 2021-06-09 | Discharge: 2021-06-09 | Disposition: A | Discharge status: Home / Self Care | Payer: Medicare HMO | Source: Ambulatory Visit | Attending: Neurology | Admitting: Neurology

## 2021-06-09 DIAGNOSIS — R413 Other amnesia: Secondary | ICD-10-CM | POA: Diagnosis not present

## 2021-06-09 DIAGNOSIS — R296 Repeated falls: Secondary | ICD-10-CM | POA: Diagnosis not present

## 2021-06-09 DIAGNOSIS — I951 Orthostatic hypotension: Secondary | ICD-10-CM | POA: Diagnosis not present

## 2021-06-09 DIAGNOSIS — R42 Dizziness and giddiness: Secondary | ICD-10-CM | POA: Diagnosis not present

## 2021-06-10 DIAGNOSIS — R69 Illness, unspecified: Secondary | ICD-10-CM | POA: Diagnosis not present

## 2021-06-10 DIAGNOSIS — K219 Gastro-esophageal reflux disease without esophagitis: Secondary | ICD-10-CM | POA: Diagnosis not present

## 2021-06-10 DIAGNOSIS — R7301 Impaired fasting glucose: Secondary | ICD-10-CM | POA: Diagnosis not present

## 2021-06-10 DIAGNOSIS — E781 Pure hyperglyceridemia: Secondary | ICD-10-CM | POA: Diagnosis not present

## 2021-06-10 DIAGNOSIS — H8113 Benign paroxysmal vertigo, bilateral: Secondary | ICD-10-CM | POA: Diagnosis not present

## 2021-06-10 DIAGNOSIS — G250 Essential tremor: Secondary | ICD-10-CM | POA: Diagnosis not present

## 2021-06-13 ENCOUNTER — Telehealth: Payer: Self-pay | Admitting: Neurology

## 2021-06-13 DIAGNOSIS — G250 Essential tremor: Secondary | ICD-10-CM | POA: Diagnosis not present

## 2021-06-13 DIAGNOSIS — R7301 Impaired fasting glucose: Secondary | ICD-10-CM | POA: Diagnosis not present

## 2021-06-13 DIAGNOSIS — R69 Illness, unspecified: Secondary | ICD-10-CM | POA: Diagnosis not present

## 2021-06-13 DIAGNOSIS — E781 Pure hyperglyceridemia: Secondary | ICD-10-CM | POA: Diagnosis not present

## 2021-06-13 DIAGNOSIS — K219 Gastro-esophageal reflux disease without esophagitis: Secondary | ICD-10-CM | POA: Diagnosis not present

## 2021-06-13 DIAGNOSIS — H8113 Benign paroxysmal vertigo, bilateral: Secondary | ICD-10-CM | POA: Diagnosis not present

## 2021-06-13 NOTE — Telephone Encounter (Signed)
[name](ON DPR) called stating [place] had MRI last week and wants the results. [name] requesting a call back.

## 2021-06-13 NOTE — Telephone Encounter (Signed)
I called and relayed the MRI and Carotid US results per Dr. Teofilo Pod result note to Lindsey Frank, (per DPR). She verbalized understanding. Will touch base with pt to get her password for mychart as to see this information.  Appreciated call back.

## 2021-06-14 DIAGNOSIS — R69 Illness, unspecified: Secondary | ICD-10-CM | POA: Diagnosis not present

## 2021-06-14 DIAGNOSIS — G250 Essential tremor: Secondary | ICD-10-CM | POA: Diagnosis not present

## 2021-06-14 DIAGNOSIS — K219 Gastro-esophageal reflux disease without esophagitis: Secondary | ICD-10-CM | POA: Diagnosis not present

## 2021-06-14 DIAGNOSIS — H8113 Benign paroxysmal vertigo, bilateral: Secondary | ICD-10-CM | POA: Diagnosis not present

## 2021-06-14 DIAGNOSIS — E781 Pure hyperglyceridemia: Secondary | ICD-10-CM | POA: Diagnosis not present

## 2021-06-14 DIAGNOSIS — R7301 Impaired fasting glucose: Secondary | ICD-10-CM | POA: Diagnosis not present

## 2021-06-14 NOTE — Progress Notes (Signed)
Chief Complaint  Patient presents with   Follow-up    2 week follow up. Not having any more dizziness or vertigo. Is not taking any B12. Has not had any more falls. Did bring in list of meds from [facility], no bp readings. Will come back for flu shot at a later date.     Patient presents for 2 week f/u on dizziness, vertigo, orthostasis and frequent falls. She had been bradycardic, and propranolol was stopped prior to her last visit (by ER, and maintained off the med). Plans for last visit were for Clonazepam dose to be reduced--morning dose was supposed to be stopped, continued the evening dose for another week, then decreased evening dose in half.  Based on the orders brought in today, it looks like she has been getting 1/2 tablet BID.  Today she is accompanied again by her friend, Jillyn Hidden. She notes no worsening of tremor noted, it is the same. No further vertigo, lightheadedness or falls. She is very unhappy at [employer], "I'm locked up in prison", and is asking about going home.  She wants to go home "today", but her son is coming tomorrow night and the plan is likely for Friday.  She has been getting PT/OT. Significant improvement noted by pt and Jillyn Hidden since last visit. He notes a remarkable improvement over the last month.  She is less confused, and stronger.  Again, pt states no further dizziness, vertigo or falls since the propranolol was stopped (and clonazepam dose reduced by half).  She feels she has benefited from therapy, and was told that she could continue this at home (possibly with the same therapist?). Her friend Jillyn Hidden highly recommended someone he sees, Dawn, who is a Pharmacist, hospital, masseuse, has a pool at the apartment complex that she uses.  We discussed this vs physical therapy.  Apparently she has the means to do this, and is aware that insurance wouldn't cover seeing Dawn, would for PT, and discussed home PT vs outpatient.  Today she is asking about Mysoline for tremor, as  she read it was "first line".  Discussed that she has tried that and didn't tolerate it, but she states she doesn't think she "gave it a good enough try".  We discussed that if tremor hasn't worsened, and isn't too bothersome, that additional treatment may not be necessary.  H/o smoking--she has been wearing the patch, and hasn't smoked.  She used to smoke with her friend [place]. [name] hadn't been coming to her house for about 2 years, but she still smoked, apparently liked the habit of hand-to-mouth.  Counseled re: smoking and use of patches ongoing, if needed.  B12 deficiency--per med list, she was never started on B12 (which I believe we sent over orders for).   Lab Results  Component Value Date   VITAMINB12 271 05/23/2021     Since our last visit, she had studies ordered per neuro: Patient had MRI done 8/23: IMPRESSION:  Abnormal MRI brain (with and without) demonstrating: - Mild atrophy and ventriculomegaly on ex vacuo basis. - Mild chronic small vessel ischemic disease.  - No acute findings.  Carotid US done 06/09/21: Summary:  Right Carotid: Velocities in the right ICA are consistent with a 1-39% stenosis.  Left Carotid: Velocities in the left ICA are consistent with a 1-39% stenosis.  Vertebrals:  Bilateral vertebral arteries demonstrate antegrade flow.  Subclavians: Normal flow hemodynamics were seen in bilateral subclavian arteries.    PMH, PSH, SH reviewed  Outpatient Encounter Medications as of  06/15/2021  Medication Sig Note   Calcium Carbonate-Vitamin D 600-200 MG-UNIT TABS Take 600 mg by mouth 2 (two) times daily.    clonazePAM (KLONOPIN) 1 MG tablet Take 1 tablet (1 mg total) by mouth 2 (two) times daily. 06/15/2021: Taking 1/2 tablet twice daily   nicotine (NICODERM CQ - DOSED IN MG/24 HR) 7 mg/24hr patch Place 1 patch (7 mg total) onto the skin daily.    omeprazole (PRILOSEC) 20 MG capsule Take 20 mg by mouth daily.    rosuvastatin (CRESTOR) 40 MG tablet Take 1  tablet (40 mg total) by mouth daily.    sertraline (ZOLOFT) 50 MG tablet Take 1 tablet (50 mg total) by mouth daily.    diphenhydramine-acetaminophen (TYLENOL PM) 25-500 MG TABS tablet Take 2 tablets by mouth at bedtime as needed. (Patient not taking: No sig reported) 08/16/2017: Uses prn, usually if has a headache when going to bed   propranolol ER (INDERAL LA) 60 MG 24 hr capsule Take 1 capsule (60 mg total) by mouth daily. (Patient not taking: No sig reported) 06/01/2021: Hasn't taken since ER visit (2d ago)   [DISCONTINUED] omeprazole (PRILOSEC OTC) 20 MG tablet Take 20 mg by mouth daily.    No facility-administered encounter medications on file as of 06/15/2021.    ROS: Denies fever, chills, URI symptoms, headaches, shortness of breath, chest pain.  Denies nausea, vomiting, bowel changes, urinary complaints, bleeding, bruising, rash. Denies lightheadedness, vertigo, falls. No worsening of tremor.   PHYSICAL EXAM:  BP 110/70   Pulse 68   Ht 5\' 3"  (1.6 m)   Wt 143 lb (64.9 kg)   LMP 03/17/1999 (Approximate)   BMI 25.33 kg/m   Wt Readings from Last 3 Encounters:  06/01/21 143 lb 3.2 oz (65 kg)  05/30/21 142 lb (64.4 kg)  05/23/21 144 lb (65.3 kg)    Pleasant female, in no distress. HEENT: conjunctiva and sclera are clear, EOMI, wearing mask. Head atraumatic Neck: no lymphadenopathy or mass Heart: regular rate and rhythm Lungs: clear bilaterally Abdomen: soft, nontender Neuro: alert, oriented.  Stood up from chair without assistance.  Walking without assistance. Once, while walking in the room, she was noted to fall back slightly against the door when turning around. On subsequent walk she moved slower and was better. She is alert and oriented, mentally much clearer.  She was a little unclear about meds--doesn't trust that they are being given properly at [facility].  Not sure that she was aware that her propranolol was stopped (though this was discussed at last visit, and is no  longer on med list, so hasn't been getting). Given the error in clonazepam directions, I understand her distrust. Psych: normal mood, affect, hygiene and grooming.    ASSESSMENT/PLAN:  Benign essential tremor - No significant worsening since change in meds, very mild and not bothersome, therefore rec not rushing for more meds. Cont clonazepam taper - Plan: clonazePAM (KLONOPIN) 0.5 MG tablet  B12 deficiency - Upon discharge home, she will start taking B12 supplement daily. - Plan: vitamin B-12 (CYANOCOBALAMIN) 1000 MCG tablet  Bradycardia - resolved upon d/c of beta blocker.  BP remains normal, and dizziness resolved.  Insomnia, unspecified type - Does very well at home, with good sleep hygiene. Discussed in case worsens upon d/c of clonazepam. Use PM meds sparingly, has fall risks  Anxiety - h/o depression, and anxiety, on low dose sertraline.  Discussed anxiety in case worsens upon tapering off clonazepam. Can titrate up SSRI vs buspar if worse  Tobacco use disorder - has been using patch at [facility]. Discussed not resuming smoking once home, can continue the patch if needed.  Klonipin 0.5mg  #15 Stop the morning clonazepam. Continue 1/2 tablet at bedtime for 1 week.  Plan is to go home Friday (waiting for son to come) Per above, we discussed recommendations for continued PT, and recommended use of assistive device with walking, to help prevent falls. We discussed driving--as long as no vertigo, seems safe to drive.  Mysoline not rec--not needed at this time. If worsening tremor, d/w neuro Counseled re: potential anxiety/insomnia worsening as clonazepam is tapered, as well as counseled re: smoking.  Declined flu shot today, prefers to wait a little longer.  Pt to f/u in 1 month. Can give flu shot at that time. Also discussed recommendation for the new COVID vaccine once available.  I spent 60 minutes dedicated to the care of this patient, including pre-visit review of records,  face to face time, post-visit ordering of testing and documentation.

## 2021-06-15 ENCOUNTER — Other Ambulatory Visit: Payer: Self-pay

## 2021-06-15 ENCOUNTER — Ambulatory Visit (INDEPENDENT_AMBULATORY_CARE_PROVIDER_SITE_OTHER): Payer: Medicare HMO | Admitting: Family Medicine

## 2021-06-15 ENCOUNTER — Encounter: Payer: Self-pay | Admitting: Family Medicine

## 2021-06-15 ENCOUNTER — Telehealth: Payer: Self-pay | Admitting: *Deleted

## 2021-06-15 VITALS — BP 110/70 | HR 68 | Ht 63.0 in | Wt 143.0 lb

## 2021-06-15 DIAGNOSIS — F419 Anxiety disorder, unspecified: Secondary | ICD-10-CM

## 2021-06-15 DIAGNOSIS — R001 Bradycardia, unspecified: Secondary | ICD-10-CM

## 2021-06-15 DIAGNOSIS — R69 Illness, unspecified: Secondary | ICD-10-CM | POA: Diagnosis not present

## 2021-06-15 DIAGNOSIS — E538 Deficiency of other specified B group vitamins: Secondary | ICD-10-CM

## 2021-06-15 DIAGNOSIS — E781 Pure hyperglyceridemia: Secondary | ICD-10-CM | POA: Diagnosis not present

## 2021-06-15 DIAGNOSIS — F17200 Nicotine dependence, unspecified, uncomplicated: Secondary | ICD-10-CM

## 2021-06-15 DIAGNOSIS — G4700 Insomnia, unspecified: Secondary | ICD-10-CM | POA: Diagnosis not present

## 2021-06-15 DIAGNOSIS — H8113 Benign paroxysmal vertigo, bilateral: Secondary | ICD-10-CM | POA: Diagnosis not present

## 2021-06-15 DIAGNOSIS — G250 Essential tremor: Secondary | ICD-10-CM

## 2021-06-15 DIAGNOSIS — K219 Gastro-esophageal reflux disease without esophagitis: Secondary | ICD-10-CM | POA: Diagnosis not present

## 2021-06-15 DIAGNOSIS — R7301 Impaired fasting glucose: Secondary | ICD-10-CM | POA: Diagnosis not present

## 2021-06-15 MED ORDER — CLONAZEPAM 0.5 MG PO TABS: ORAL_TABLET | ORAL | 1 refills | Status: DC

## 2021-06-15 MED ORDER — VITAMIN B-12 1000 MCG PO TABS
1000.0000 ug | ORAL_TABLET | Freq: Every day | ORAL | 0 refills | Status: AC
Start: 2021-06-15 — End: ?

## 2021-06-15 NOTE — Patient Instructions (Addendum)
Once you go home, continue the patch for perhaps a week, then you can try stopping, as long as you don't start smoking again.  If you start smoking again, restart the patches.  You would be safe to drive as long as you are not having any vertigo.  Please refrain from driving if the dizziness/vertigo recur.  You have shown great improvement in your cognition (thinking) and in your strength. Now that the dizziness has resolved, no further vertigo and no further falls, I do think you are safe to go home, but with continued physical therapy. You seemed a little unsteady with walking--I think you need to use an assistive device (walker or cane--discuss with the physical therapy which would be most appropriate for you to use), in order to prevent falls when walking.  Since we have changed your medications some, I'd like for you to have assistance in filling your pill box according to the directions provided.   Start taking B12 (1mg  or 1000 mcg) once daily. Continue calcium with D. Continue omeprazole 20mg  daily. Continue rosuvastatin 40mg  daily Continue sertraline 50 mg daily.  Use tylenol PM sparingly--only if you are having a hard time sleeping. You may use plain tylenol if needed just for a headache or other pain.  We have already stopped your propranolol, and we are keeping you off of that medication. (Please get rid of any bottles that may still be at home).  We are lowering your clonazepam.  You USED to take a full pill (1mg ) twice daily. You are currently taking 1/2 pill twice daily. STOP MORNING DOSE. Continue 0.5mg  at bedtime (this is 1/2 tablet of a 1mg  tablet--I'm going to send in a prescription to your pharmacy for a 0.5mg  dose. When you pick this up from the pharmacy, when you're at home, take 1 tablet at bedtime--of the lower dose). Take the 0.5mg  tablet at bedtime for 1 week.  Then I want you to cut it in half. Take 1/2 tablet (0.25mg ) at bedtime for 1 week.  Then you can stop it  entirely.

## 2021-06-15 NOTE — Telephone Encounter (Signed)
Lindsey Frank with [facility] called asking for verbal order to d/c Home Health OT per patient.  [phone].

## 2021-06-16 DIAGNOSIS — R69 Illness, unspecified: Secondary | ICD-10-CM | POA: Diagnosis not present

## 2021-06-16 DIAGNOSIS — K219 Gastro-esophageal reflux disease without esophagitis: Secondary | ICD-10-CM | POA: Diagnosis not present

## 2021-06-16 DIAGNOSIS — E781 Pure hyperglyceridemia: Secondary | ICD-10-CM | POA: Diagnosis not present

## 2021-06-16 DIAGNOSIS — R7301 Impaired fasting glucose: Secondary | ICD-10-CM | POA: Diagnosis not present

## 2021-06-16 DIAGNOSIS — H8113 Benign paroxysmal vertigo, bilateral: Secondary | ICD-10-CM | POA: Diagnosis not present

## 2021-06-16 DIAGNOSIS — G250 Essential tremor: Secondary | ICD-10-CM | POA: Diagnosis not present

## 2021-06-16 NOTE — Telephone Encounter (Signed)
Done

## 2021-06-16 NOTE — Telephone Encounter (Signed)
Ok to d/c OT.  I suspect she will be going home tomorrow. I'd like for PT to address assistive devices with her (walker vs which type of cane). Please advise them. Thanks

## 2021-06-22 ENCOUNTER — Telehealth: Payer: Self-pay | Admitting: Family Medicine

## 2021-06-22 NOTE — Telephone Encounter (Signed)
Physical therapist with [facility]  [phone] left message  She has moved home and wants verbal order to do home safety evaluation and then extend her PT as needed after that

## 2021-06-22 NOTE — Telephone Encounter (Signed)
Ok for referral?

## 2021-06-22 NOTE — Telephone Encounter (Signed)
Done

## 2021-06-24 DIAGNOSIS — R7301 Impaired fasting glucose: Secondary | ICD-10-CM | POA: Diagnosis not present

## 2021-06-24 DIAGNOSIS — G250 Essential tremor: Secondary | ICD-10-CM | POA: Diagnosis not present

## 2021-06-24 DIAGNOSIS — H8113 Benign paroxysmal vertigo, bilateral: Secondary | ICD-10-CM | POA: Diagnosis not present

## 2021-07-04 ENCOUNTER — Telehealth: Payer: Self-pay | Admitting: *Deleted

## 2021-07-04 NOTE — Telephone Encounter (Signed)
Left message for patient

## 2021-07-04 NOTE — Telephone Encounter (Signed)
Patient called and she is still having vertigo. She has had some numbness, but not pain in her toes. Feels like she has cotton in between them-she wonders if this has something to do with her vertigo? I offered her an appt-she just wanted me to ask you the question re:any correlation.

## 2021-07-04 NOTE — Telephone Encounter (Signed)
Advise pt--the sensation in her feet sounds like neuropathy.  Possibly could be related to her B12 being low. Ensure that she is taking her B12 vitamins.  If she is, and if symptoms persist, she should schedule an appointment. May need to check levels and see if B12 shots are needed.

## 2021-07-12 NOTE — Progress Notes (Deleted)
   She called 9/19 stating she was still having vertigo. She has had some numbness in her toes, not painful.  Feels like she has cotton in between them  She has known borderline levels of B12, 271 in 05/2021. She was supposed to start 1000 mcg of oral B12 daily. She is taking She has h/o pre-diabetes. Last a1c was improved (was up to 6.1% in 08/2020). Lab Results  Component Value Date   HGBA1C 5.7 (A) 03/24/2021     Her Propranolol was stopped due to orthostasis and falls, as well as bradycardia.  We have also been tapered her off of clonazepam. Plan was to stop the morning clonazepam after last visit, continue 1/2 tablet at bedtime for 1 week, then stop.  She was rx'd 0.5mg  #15. We had discussed plans if any insomnia or anxiety worsened upon stopping the evening clonazepam dose (increase sertraline dose).  ENSURE NOT SMOKING  At last visit we discussed driving--as long as no vertigo, seems safe to drive.  Since her last visit, she is back in her home, getting home PT.     ROS: Denies fever, chills, URI symptoms, headaches, shortness of breath, chest pain.  Denies nausea, vomiting, bowel changes, urinary complaints, bleeding, bruising, rash. Denies lightheadedness, vertigo, falls. No worsening of tremor.    PHYSICAL EXAM:  LMP 03/17/1999 (Approximate)  Wt Readings from Last 3 Encounters:  06/15/21 143 lb (64.9 kg)  06/01/21 143 lb 3.2 oz (65 kg)  05/30/21 142 lb (64.4 kg)     Pleasant female, in no distress. HEENT: conjunctiva and sclera are clear, EOMI, wearing mask. Head atraumatic Neck: no lymphadenopathy or mass Heart: regular rate and rhythm Lungs: clear bilaterally Abdomen: soft, nontender Neuro: alert, oriented.  Stood up from chair without assistance.  Walking without assistance.  UPDATE NEURO  Psych: normal mood, affect, hygiene and grooming.   ASSESSMENT/PLAN:  Flu shot COVID bivalent vaccine  Consider A1c to eval neuropathy (plan was to do in 09/2021  at [facility]). Lab Results  Component Value Date   HGBA1C 5.7 (A) 03/24/2021    She sees neuro in f/u on 11/9 Scheduled 12/22 for CPE/AWV

## 2021-07-13 ENCOUNTER — Encounter: Payer: Medicare HMO | Admitting: Family Medicine

## 2021-07-15 ENCOUNTER — Telehealth: Payer: Self-pay

## 2021-07-15 NOTE — Telephone Encounter (Signed)
Lindsey Frank a RN with [facility] called to let us know that they were going to discharge Lindsey Frank. She said they have been trying to contact her and continue her PT, but the pt. Refused to answer the phone or call them back. They last saw her 06/13/21 but stated they did a wellfare check and she was at home and fine.

## 2021-07-16 ENCOUNTER — Emergency Department (HOSPITAL_COMMUNITY): Payer: Medicare HMO

## 2021-07-16 ENCOUNTER — Other Ambulatory Visit: Payer: Self-pay

## 2021-07-16 ENCOUNTER — Encounter (HOSPITAL_COMMUNITY): Payer: Self-pay | Admitting: Emergency Medicine

## 2021-07-16 ENCOUNTER — Emergency Department (HOSPITAL_COMMUNITY)
Admission: EM | Admit: 2021-07-16 | Discharge: 2021-07-16 | Disposition: A | Discharge status: Home / Self Care | Payer: Medicare HMO | Attending: Emergency Medicine | Admitting: Emergency Medicine

## 2021-07-16 DIAGNOSIS — W19XXXA Unspecified fall, initial encounter: Secondary | ICD-10-CM

## 2021-07-16 DIAGNOSIS — W1839XA Other fall on same level, initial encounter: Secondary | ICD-10-CM | POA: Diagnosis not present

## 2021-07-16 DIAGNOSIS — J9811 Atelectasis: Secondary | ICD-10-CM | POA: Diagnosis not present

## 2021-07-16 DIAGNOSIS — Z87891 Personal history of nicotine dependence: Secondary | ICD-10-CM | POA: Insufficient documentation

## 2021-07-16 DIAGNOSIS — R404 Transient alteration of awareness: Secondary | ICD-10-CM | POA: Diagnosis not present

## 2021-07-16 DIAGNOSIS — S0990XA Unspecified injury of head, initial encounter: Secondary | ICD-10-CM | POA: Diagnosis not present

## 2021-07-16 DIAGNOSIS — Z743 Need for continuous supervision: Secondary | ICD-10-CM | POA: Diagnosis not present

## 2021-07-16 DIAGNOSIS — R42 Dizziness and giddiness: Secondary | ICD-10-CM | POA: Diagnosis not present

## 2021-07-16 DIAGNOSIS — R519 Headache, unspecified: Secondary | ICD-10-CM | POA: Diagnosis not present

## 2021-07-16 DIAGNOSIS — R410 Disorientation, unspecified: Secondary | ICD-10-CM | POA: Diagnosis not present

## 2021-07-16 DIAGNOSIS — S0993XA Unspecified injury of face, initial encounter: Secondary | ICD-10-CM | POA: Diagnosis not present

## 2021-07-16 DIAGNOSIS — S0083XA Contusion of other part of head, initial encounter: Secondary | ICD-10-CM | POA: Insufficient documentation

## 2021-07-16 DIAGNOSIS — R4701 Aphasia: Secondary | ICD-10-CM | POA: Diagnosis not present

## 2021-07-16 DIAGNOSIS — S060X0A Concussion without loss of consciousness, initial encounter: Secondary | ICD-10-CM

## 2021-07-16 DIAGNOSIS — G319 Degenerative disease of nervous system, unspecified: Secondary | ICD-10-CM | POA: Diagnosis not present

## 2021-07-16 DIAGNOSIS — R110 Nausea: Secondary | ICD-10-CM | POA: Diagnosis not present

## 2021-07-16 DIAGNOSIS — M2578 Osteophyte, vertebrae: Secondary | ICD-10-CM | POA: Diagnosis not present

## 2021-07-16 DIAGNOSIS — Z043 Encounter for examination and observation following other accident: Secondary | ICD-10-CM | POA: Diagnosis not present

## 2021-07-16 LAB — CBC WITH DIFFERENTIAL/PLATELET
Abs Immature Granulocytes: 0.03 10*3/uL (ref 0.00–0.07)
Basophils Absolute: 0.1 10*3/uL (ref 0.0–0.1)
Basophils Relative: 1 %
Eosinophils Absolute: 0 10*3/uL (ref 0.0–0.5)
Eosinophils Relative: 0 %
HCT: 40.4 % (ref 36.0–46.0)
Hemoglobin: 13.1 g/dL (ref 12.0–15.0)
Immature Granulocytes: 0 %
Lymphocytes Relative: 12 %
Lymphs Abs: 1.3 10*3/uL (ref 0.7–4.0)
MCH: 27.8 pg (ref 26.0–34.0)
MCHC: 32.4 g/dL (ref 30.0–36.0)
MCV: 85.8 fL (ref 80.0–100.0)
Monocytes Absolute: 0.8 10*3/uL (ref 0.1–1.0)
Monocytes Relative: 8 %
Neutro Abs: 8.2 10*3/uL — ABNORMAL HIGH (ref 1.7–7.7)
Neutrophils Relative %: 79 %
Platelets: 239 10*3/uL (ref 150–400)
RBC: 4.71 MIL/uL (ref 3.87–5.11)
RDW: 14.5 % (ref 11.5–15.5)
WBC: 10.4 10*3/uL (ref 4.0–10.5)
nRBC: 0 % (ref 0.0–0.2)

## 2021-07-16 LAB — COMPREHENSIVE METABOLIC PANEL
ALT: 14 U/L (ref 0–44)
AST: 21 U/L (ref 15–41)
Albumin: 4.5 g/dL (ref 3.5–5.0)
Alkaline Phosphatase: 62 U/L (ref 38–126)
Anion gap: 12 (ref 5–15)
BUN: 19 mg/dL (ref 8–23)
CO2: 26 mmol/L (ref 22–32)
Calcium: 10 mg/dL (ref 8.9–10.3)
Chloride: 101 mmol/L (ref 98–111)
Creatinine, Ser: 1.25 mg/dL — ABNORMAL HIGH (ref 0.44–1.00)
GFR, Estimated: 45 mL/min — ABNORMAL LOW (ref >60)
Glucose, Bld: 116 mg/dL — ABNORMAL HIGH (ref 70–99)
Potassium: 3.9 mmol/L (ref 3.5–5.1)
Sodium: 139 mmol/L (ref 135–145)
Total Bilirubin: 0.9 mg/dL (ref 0.3–1.2)
Total Protein: 6.7 g/dL (ref 6.5–8.1)

## 2021-07-16 LAB — URINALYSIS, ROUTINE W REFLEX MICROSCOPIC
Bilirubin Urine: NEGATIVE
Glucose, UA: NEGATIVE mg/dL
Ketones, ur: 20 mg/dL — AB
Leukocytes,Ua: NEGATIVE
Nitrite: NEGATIVE
Protein, ur: NEGATIVE mg/dL
Specific Gravity, Urine: 1.012 (ref 1.005–1.030)
pH: 6 (ref 5.0–8.0)

## 2021-07-16 LAB — AMMONIA: Ammonia: 12 umol/L (ref 9–35)

## 2021-07-16 LAB — TROPONIN I (HIGH SENSITIVITY)
Troponin I (High Sensitivity): 14 ng/L (ref <18)
Troponin I (High Sensitivity): 16 ng/L (ref <18)

## 2021-07-16 MED ORDER — ONDANSETRON HCL 4 MG/2ML IJ SOLN
4.0000 mg | Freq: Once | INTRAMUSCULAR | Status: AC
  Administered 2021-07-16: 4 mg via INTRAVENOUS
  Filled 2021-07-16: qty 2

## 2021-07-16 MED ORDER — LACTATED RINGERS IV BOLUS
1000.0000 mL | Freq: Once | INTRAVENOUS | Status: AC
  Administered 2021-07-16: 1000 mL via INTRAVENOUS

## 2021-07-16 MED ORDER — ACETAMINOPHEN 500 MG PO TABS
1000.0000 mg | ORAL_TABLET | Freq: Once | ORAL | Status: AC
  Administered 2021-07-16: 1000 mg via ORAL
  Filled 2021-07-16: qty 2

## 2021-07-16 NOTE — ED Notes (Signed)
Pt was provided with paper scrubs. Pt ambulated 40 feet with walker without incident.

## 2021-07-16 NOTE — ED Provider Notes (Signed)
[facility] EMERGENCY DEPARTMENT Provider Note   CSN: [id number] Arrival date & time: 07/16/21  1829     History Chief Complaint  Patient presents with   Lindsey Frank is a 75 y.o. female.  Patient is a 75 year old female with a history of essential tremor, depression, hyperlipidemia and prior tobacco use who is presenting today with complaints of headache after a fall.  Patient reports that she got up last night to use the bathroom and her power had gone out and she misjudged where she was and fell hitting her face and head on the floor.  She denies losing consciousness was able to get up and go to the bathroom and go back to bed however reported when she woke up this morning she had ongoing headache, pain in the right side of her face and in the upper part of her neck.  She also noticed today she had had some difficulty finding her words and felt intermittently dizzy and nauseated.  She has not had any vomiting but has not had anything to eat or drink today.  She was walking over to her neighbor's house to see if they would bring her to the emergency room when she stepped off the side of the concrete and slid in the grass falling again and thinks she hit her head a second time.  She denies loss of consciousness with this fall as well.  She always has essential tremor but has noted since the fall last night she has been much more twitchy and shaky.  She is not had any recent medication changes and has not taken any of her medications today.  She has no visual changes.  No unilateral weakness or numbness.  She does not take any anticoagulation.  The headache is in the right side of her forehead and in the back of her head and is currently a 6 out of 10 nothing seems to make it better or worse.  It does not radiate.  The history is provided by the patient and medical records.  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 Dagoberto Ligas   COSMETIC SURGERY  2008   neck lift   LAPAROSCOPY  1981   endometriosis   TONSILECTOMY, ADENOIDECTOMY, BILATERAL MYRINGOTOMY AND TUBES  1953   age 52   UTERINE FIBROID SURGERY  1977   with appendectomy     OB History     Gravida  2   Para  1  Term  1   Preterm      AB  1   Living  1      SAB  1   IAB      Ectopic      Multiple      Live Births  1           Family History  Problem Relation Age of Onset   Diabetes Mother    Heart disease Father 44   Stroke Father 68   Throat cancer Father    Depression Brother    Stomach cancer Maternal Aunt    Depression Maternal Uncle        suicide   Alzheimer's disease Cousin     Social History   Tobacco Use   Smoking status: Former    Packs/day: 0.25    Years: 30.00    Pack years: 7.50    Types: Cigarettes   Smokeless tobacco: Never   Tobacco comments:    quit 12/2014, relapsed with stress, quit again end of July 2016  Vaping Use   Vaping Use: Never used  Substance Use Topics   Alcohol use: Yes    Alcohol/week:  0.0 - 1.0 standard drinks    Comment: (occasional [place]), occasionally on weekends   Drug use: No    Home Medications Prior to Admission medications   Medication Sig Start Date End Date Taking? Authorizing Provider  acetaminophen (TYLENOL) 500 MG tablet Take 1,000 mg by mouth every 6 (six) hours as needed for moderate pain or headache.   Yes [provider]  Calcium Carbonate-Vitamin D 600-200 MG-UNIT TABS Take 600 mg by mouth 2 (two) times daily.   Yes [provider]  diphenhydramine-acetaminophen (TYLENOL PM) 25-500 MG TABS tablet Take 2 tablets by mouth at bedtime as needed (sleep).   Yes [provider]  omeprazole (PRILOSEC) 20 MG capsule Take 20 mg by mouth daily. 06/07/21  Yes [provider]  rosuvastatin (CRESTOR) 40 MG tablet Take 1 tablet (40 mg total) by mouth daily. Patient taking differently: Take 40 mg by mouth at bedtime. 09/15/20  Yes [name], MD  sertraline (ZOLOFT) 50 MG tablet Take 1 tablet (50 mg total) by mouth daily. 09/15/20  Yes [name], MD  vitamin B-12 (CYANOCOBALAMIN) 1000 MCG tablet Take 1 tablet (1,000 mcg total) by mouth daily. 06/15/21  Yes [name], MD  clonazePAM (KLONOPIN) 0.5 MG tablet Take 1 tablet by mouth at bedtime for 1 week.  Then lower to 1/2 tablet by mouth at bedtime for 1 week, then stop Patient not taking: Reported on 07/16/2021 06/15/21   [name], MD  nicotine (NICODERM CQ - DOSED IN MG/24 HR) 7 mg/24hr patch Place 1 patch (7 mg total) onto the skin daily. Patient not taking: No sig reported 05/19/21   [name], MD    Allergies    Trazodone and nefazodone and Mysoline [primidone]  Review of Systems   Review of Systems  All other systems reviewed and are negative.  Physical Exam Updated Vital Signs BP 137/76   Pulse 78   Temp 98.4 F (36.9 C) (Oral)   Resp 18   Ht 5\' 3"  (1.6 m)   Wt 64.4 kg   LMP 03/17/1999 (Approximate)   SpO2 97%   BMI 25.15 kg/m   Physical Exam Vitals and  nursing note reviewed.  Constitutional:      General: She is not in acute distress.    Appearance: Normal appearance. She is well-developed and  normal weight.  HENT:     Head: Normocephalic and atraumatic.     Comments: Right upper lip ecchymosis.  Teeth intact    Mouth/Throat:     Mouth: Mucous membranes are dry.  Eyes:     Pupils: Pupils are equal, round, and reactive to light.  Neck:   Cardiovascular:     Rate and Rhythm: Normal rate and regular rhythm.     Heart sounds: Normal heart sounds. No murmur heard.   No friction rub.  Pulmonary:     Effort: Pulmonary effort is normal.     Breath sounds: Normal breath sounds. No wheezing or rales.  Abdominal:     General: Bowel sounds are normal. There is no distension.     Palpations: Abdomen is soft.     Tenderness: There is no abdominal tenderness. There is no guarding or rebound.  Musculoskeletal:        General: Normal range of motion.     Cervical back: Tenderness present. Spinous process tenderness and muscular tenderness present.       Legs:     Comments: No edema  Skin:    General: Skin is warm and dry.     Findings: No rash.  Neurological:     Mental Status: She is alert and oriented to person, place, and time.     Cranial Nerves: No cranial nerve deficit.     Sensory: No sensory deficit.     Motor: No weakness.     Coordination: Coordination normal.     Comments: Central tremor noted in all 4 extremities worse with intention.  Normal heel-to-shin bilaterally.  Strength is 5 out of 5 in all 4 extremities.  No notable pronator drift.  Very rare word finding difficulty which is only with 1 or 2 words and then quickly resolves  Psychiatric:        Mood and Affect: Mood normal.        Behavior: Behavior normal.    ED Results / Procedures / Treatments   Labs (all labs ordered are listed, but only abnormal results are displayed) Labs Reviewed  URINALYSIS, ROUTINE W REFLEX MICROSCOPIC - Abnormal; Notable for the  following components:      Result Value   Hgb urine dipstick SMALL (*)    Ketones, ur 20 (*)    Bacteria, UA RARE (*)    All other components within normal limits  CBC WITH DIFFERENTIAL/PLATELET - Abnormal; Notable for the following components:   Neutro Abs 8.2 (*)    All other components within normal limits  COMPREHENSIVE METABOLIC PANEL - Abnormal; Notable for the following components:   Glucose, Bld 116 (*)    Creatinine, Ser 1.25 (*)    GFR, Estimated 45 (*)    All other components within normal limits  AMMONIA  TROPONIN I (HIGH SENSITIVITY)  TROPONIN I (HIGH SENSITIVITY)    EKG EKG Interpretation  Date/Time:  Saturday July 16 2021 19:56:01 EDT Ventricular Rate:  73 PR Interval:  158 QRS Duration: 82 QT Interval:  539 QTC Calculation: 595 R Axis:   49 Text Interpretation: Sinus rhythm Low voltage, precordial leads Borderline T abnormalities, anterior leads new Prolonged QT interval Confirmed by Gwyneth Sprout ([id number]) on 07/16/2021 8:27:18 PM  Radiology CT Head Wo Contrast  Result Date: 07/16/2021 CLINICAL DATA:  Status post fall. EXAM: CT HEAD WITHOUT CONTRAST TECHNIQUE: Contiguous axial images were obtained from the base of the skull through the vertex without intravenous contrast. COMPARISON:  May 30, 2021 FINDINGS:  Brain: There is mild cerebral atrophy with widening of the extra-axial spaces and ventricular dilatation. There are areas of decreased attenuation within the white matter tracts of the supratentorial brain, consistent with microvascular disease changes. Vascular: No hyperdense vessel or unexpected calcification. Skull: Normal. Negative for fracture or focal lesion. Sinuses/Orbits: No acute finding. Other: None. IMPRESSION: 1. Mild cerebral atrophy and microvascular disease changes of the supratentorial brain. 2. No acute intracranial abnormality. Electronically Signed   By: Aram Candela M.D.   On: 07/16/2021 20:03   CT Cervical Spine Wo  Contrast  Result Date: 07/16/2021 CLINICAL DATA:  Status post fall. EXAM: CT CERVICAL SPINE WITHOUT CONTRAST TECHNIQUE: Multidetector CT imaging of the cervical spine was performed without intravenous contrast. Multiplanar CT image reconstructions were also generated. COMPARISON:  None. FINDINGS: Alignment: Normal. Skull base and vertebrae: No acute fracture. No primary bone lesion or focal pathologic process. Soft tissues and spinal canal: No prevertebral fluid or swelling. No visible canal hematoma. Disc levels: Very mild anterior osteophyte formation is seen at the level of C5-C6. There is marked severity narrowing of the anterior atlantoaxial articulation. Mild multilevel intervertebral disc space narrowing is seen throughout the cervical spine. This is slightly more prominent at the level of C7-T1. Bilateral marked severity multilevel facet joint hypertrophy is noted. Upper chest: Negative. Other: None. IMPRESSION: 1. No acute cervical spine fracture or subluxation. 2. Multilevel degenerative changes, most notably in the form of multilevel facet joint hypertrophy. Electronically Signed   By: Aram Candela M.D.   On: 07/16/2021 20:06   DG Chest Port 1 View  Result Date: 07/16/2021 CLINICAL DATA:  Status post fall with headache with nausea and dizziness. EXAM: PORTABLE CHEST 1 VIEW COMPARISON:  None. FINDINGS: A trace amount of linear atelectasis is seen within the left lung base. There is no evidence of acute infiltrate, pleural effusion or pneumothorax. The heart size and mediastinal contours are within normal limits. A moderate sized hiatal hernia is suspected. The visualized skeletal structures are unremarkable. IMPRESSION: 1. No active cardiopulmonary disease. Electronically Signed   By: Aram Candela M.D.   On: 07/16/2021 19:59   CT Maxillofacial Wo Contrast  Result Date: 07/16/2021 CLINICAL DATA:  Status post trauma. EXAM: CT MAXILLOFACIAL WITHOUT CONTRAST TECHNIQUE: Multidetector CT  imaging of the maxillofacial structures was performed. Multiplanar CT image reconstructions were also generated. COMPARISON:  None. FINDINGS: Osseous: No fracture or mandibular dislocation. No destructive process. Orbits: Negative. No traumatic or inflammatory finding. Sinuses: Clear. Soft tissues: Negative. Limited intracranial: No significant or unexpected finding. IMPRESSION: Negative for acute fracture or dislocation of the facial bones. Electronically Signed   By: Aram Candela M.D.   On: 07/16/2021 20:10    Procedures Procedures   Medications Ordered in ED Medications  lactated ringers bolus 1,000 mL (0 mLs Intravenous Stopped 07/16/21 2120)  ondansetron (ZOFRAN) injection 4 mg (4 mg Intravenous Given 07/16/21 1935)  acetaminophen (TYLENOL) tablet 1,000 mg (1,000 mg Oral Given 07/16/21 1934)    ED Course  I have reviewed the triage vital signs and the nursing notes.  Pertinent labs & imaging results that were available during my care of the patient were reviewed by me and considered in my medical decision making (see chart for details).    MDM Rules/Calculators/A&P                           Elderly female presenting today with ongoing head and facial pain as well as neck pain  and some word finding difficulty.  Trauma occurred last night when she misjudged where she was when her power was out.  She does have some mild bruising over the right upper lip but no other obvious signs of trauma.  She is not anticoagulated.  Concern for intracranial hemorrhage, prior stroke versus electrolyte abnormality.  Patient given pain and nausea control.  She has not had anything to eat today and was given IV fluids.  Labs and imaging are pending.  10:41 PM Patient's head, cervical and maxillofacial CT are negative for acute injury or fracture.  Cervical spine was cleared.  Troponin, EKG, CBC, UA are all within normal limits.  CMP with mild AKI with creatinine 1.25 from baseline of 1.8 but normal and  ammonia and chest x-ray.    Patient was able to ambulate here with a walker without any difficulty.  She does on occasion say things that do not seem to make sense.  She reported that her son was visiting her currently but could not come and pick her up.  When speaking with her son he lives in [place] and had been down to visit over Labor Day but states had already gone back to [place].  Patient does have prescription for Klonopin and ask her son if she would possibly be taking more than what she is supposed to and he reports that he helps out but he did notice when he was talking to her on the phone yesterday she said 1 or 2 things that seem slightly off.  She does not have hx of alcohol abuse.  He will follow-up with her close friends and neighbors to ensure that she is taking her medication appropriately and also follow-up with her doctor.  Does not appear to have frank delirium here no evidence of infection and is otherwise well-appearing.  Will discharge home but given return precautions.  She most likely also has a mild concussion.  MDM   Amount and/or Complexity of Data Reviewed Clinical lab tests: ordered and reviewed Tests in the radiology section of CPT: ordered and reviewed Tests in the medicine section of CPT: ordered and reviewed Independent visualization of images, tracings, or specimens: yes  Patient Progress Patient progress: stable    Final Clinical Impression(s) / ED Diagnoses Final diagnoses:  Fall, initial encounter  Contusion of face, initial encounter  Concussion without loss of consciousness, initial encounter    Rx / DC Orders ED Discharge Orders     None        Gwyneth Sprout, MD 07/16/21 2241

## 2021-07-16 NOTE — ED Notes (Signed)
Lindsey Frank called back and stated she will be picking pt up.

## 2021-07-16 NOTE — ED Notes (Signed)
Left voicemail for Lindsey Frank to callback [facility].

## 2021-07-16 NOTE — Discharge Instructions (Signed)
The CAT scans and blood work look good today except for that you are a little bit dehydrated.  There is no sign of internal bleeding or broken bones.  You can take Tylenol as needed for the aches and pains.  Please make sure you are drinking plenty of fluids and eating so that you do not get dehydrated again.  Make sure you are taking your medication as prescribed.  If you start having difficulty walking, passing out, severe headache, vomiting, confusion you should return to the hospital.

## 2021-07-16 NOTE — ED Notes (Signed)
Patient transported to CT 

## 2021-07-16 NOTE — ED Notes (Signed)
Pt messaged her friend Jillyn Hidden. Jillyn Hidden stated he is unable to pick her up.

## 2021-07-16 NOTE — ED Triage Notes (Signed)
Pt arrives via EMS from home with multiple falls. Pt reports falling last night and having headache, nausea and dizziness. Pt reports walking outside then neighbors saw her fall outside. EMS reports episodes of confusion and trouble talking.

## 2021-07-18 ENCOUNTER — Other Ambulatory Visit: Payer: Self-pay

## 2021-07-18 ENCOUNTER — Ambulatory Visit (INDEPENDENT_AMBULATORY_CARE_PROVIDER_SITE_OTHER): Payer: Medicare HMO | Admitting: Family Medicine

## 2021-07-18 ENCOUNTER — Encounter: Payer: Self-pay | Admitting: Family Medicine

## 2021-07-18 VITALS — BP 130/80 | HR 84 | Ht 63.0 in | Wt 135.4 lb

## 2021-07-18 DIAGNOSIS — S060X0A Concussion without loss of consciousness, initial encounter: Secondary | ICD-10-CM

## 2021-07-18 DIAGNOSIS — G250 Essential tremor: Secondary | ICD-10-CM

## 2021-07-18 DIAGNOSIS — R110 Nausea: Secondary | ICD-10-CM | POA: Diagnosis not present

## 2021-07-18 DIAGNOSIS — Z23 Encounter for immunization: Secondary | ICD-10-CM | POA: Diagnosis not present

## 2021-07-18 DIAGNOSIS — W19XXXA Unspecified fall, initial encounter: Secondary | ICD-10-CM

## 2021-07-18 MED ORDER — ONDANSETRON 4 MG PO TBDP: 4.0000 mg | ORAL_TABLET | Freq: Three times a day (TID) | ORAL | 0 refills | Status: DC | PRN

## 2021-07-18 NOTE — Patient Instructions (Addendum)
   Please read the information provided--you have a concussion, and need BRAIN REST.  It is very important for you to drink at least 6-8 glasses of water daily, aiming for very clear uriine.  This will help improve your kidneys and prevent dizziness. I'm worried about you falling again, and furthering injuring your head/brain.  Your nausea is likely related to the concussion.  Reflux may contribute to nausea and the sensation you described in your chest . Be sure to take your omeprazole today.  I'm prescribing ondansetron (zofran) for you to take if needed for nausea.  I want to make sure that you are eating and drinking, so use this if the nausea is keeping you from being able to eat and drink   Follow up with your eye doctor, since you report change in vision in the R eye since the fall.

## 2021-07-18 NOTE — Progress Notes (Signed)
Chief Complaint  Patient presents with   Hospitalization Follow-up    Larey Seat and hit right eye, forehead and above lip on right side. Had a really bad headache after her first fall. Patient said she is dehydrated.    Patient presents for ER follow-up.  Visit 07/16/21 was reviewed in detail.  Fell at home when the power was out, hitting R side of face. In that note, it was reported that she fell again on the way to neighbor's house.  She states she fell on 2 different days, went to ER twice, had 2 sets of CTs and MRI's.  Today she brought in paperwork from 10/1. Thinks these visits were 2 days apart or so.  She reports she hit R side of her face (forehead, R eye, upper jaw.  She recalls that with the other ER visit she had severe pain down the top/center of her head (recalls screaming in the ER for pain meds). She reports that this pain has eased off.  She has some mild chronic nausea, hasn't eaten anything today. She recalls being told by one of the doctors that she was dehydrated.  (Review of chart--Cr was elevated at 1.25, and I think that is what the doctor was referring to).  She admits she hasn't been drinking much due to nausea.  She feels like something is "bubbling back up in her chest" when she swallows. She is getting out of breath with any activity.  She thinks she needs to f/u with her eye doctor, since she hit R eye, the one that had cataract surgery on.  She reports some change in vision since the fall, denies eye pain.  She recalls that after the first fall (of the two recent ones), she felt very jittery, she demonstrates her hands/arms being jerky, stating that the tremor was much worse. She reports this was temporary, and that overall her current tremor is about the same.   She called 9/19 stating she was still having vertigo. Denies this now. She also reported having some numbness in her toes, not painful, feeling like she has cotton in between them. She reports this persists, L>R    She has known borderline levels of B12, 271 in 05/2021. She only recently started taking 1000 mcg of oral B12 daily.     Her Propranolol was stopped due to orthostasis and falls, as well as bradycardia.  We have also been tapered her off of clonazepam. She reports no longer taking this medication.  Asked if she has noticed any increase in anxiety or insomnia since stopping the medication, she reported that she had 2 nights (between her 2 falls) where she didn't sleep well, due to having a headache. Otherwise no trouble sleeping or worsened anxiety.  Denies smoking, smells of smoke. Her friend [name] brought her today, whom I recall from prior discussions is someone who smokes.  She didn't answer this very directly, so I wonder if she has had any cigarettes since returning home.  There was some discussion in the ER notes abut some possible confusion, and of her saying things that didn't make sense. Patient was a little ruffled at first, confused. She states that her son had been down all last week. Per ER notes, he was here over Labor Day and not since.  I did not call him to verify whether he was visiting last week or not.  She seemed to be getting irritable, and topic was changed.  We had gotten a message that she hadn't  been getting the home PT.  Patient wasn't answering calls or returning their calls.  Patient is very willing to have PT, wants this, and reports she will contact them to set up another date.   PMH, PSH, SH reviewed  Outpatient Encounter Medications as of 07/18/2021  Medication Sig Note   diphenhydramine-acetaminophen (TYLENOL PM) 25-500 MG TABS tablet Take 2 tablets by mouth at bedtime as needed (sleep).    naproxen sodium (ALEVE) 220 MG tablet Take 440 mg by mouth as needed. 07/18/2021: Last dose 9am this morning.    omeprazole (PRILOSEC) 20 MG capsule Take 20 mg by mouth daily.    rosuvastatin (CRESTOR) 40 MG tablet Take 1 tablet (40 mg total) by mouth daily. (Patient taking  differently: Take 40 mg by mouth at bedtime.)    sertraline (ZOLOFT) 50 MG tablet Take 1 tablet (50 mg total) by mouth daily.    vitamin B-12 (CYANOCOBALAMIN) 1000 MCG tablet Take 1 tablet (1,000 mcg total) by mouth daily.    acetaminophen (TYLENOL) 500 MG tablet Take 1,000 mg by mouth every 6 (six) hours as needed for moderate pain or headache. (Patient not taking: Reported on 07/18/2021)    Calcium Carbonate-Vitamin D 600-200 MG-UNIT TABS Take 600 mg by mouth 2 (two) times daily. (Patient not taking: Reported on 07/18/2021)    [DISCONTINUED] clonazePAM (KLONOPIN) 0.5 MG tablet Take 1 tablet by mouth at bedtime for 1 week.  Then lower to 1/2 tablet by mouth at bedtime for 1 week, then stop (Patient not taking: Reported on 07/16/2021)    [DISCONTINUED] nicotine (NICODERM CQ - DOSED IN MG/24 HR) 7 mg/24hr patch Place 1 patch (7 mg total) onto the skin daily. (Patient not taking: No sig reported)    No facility-administered encounter medications on file as of 07/18/2021.   Allergies  Allergen Reactions   Trazodone And Nefazodone Nausea Only   Mysoline [Primidone] Nausea Only    Nausea and achey    ROS: Denies fever, chills, URI symptoms, chest pain.  Denies vomiting, bowel changes, urinary complaints, bleeding or rash. Gurgling in chest per HPI, along with nausea and some shortness of breath. Numbness in toes per HPI.  Recent falls per HPI.   PHYSICAL EXAM:  BP 130/80   Pulse 84   Ht 5\' 3"  (1.6 m)   Wt 135 lb 6.4 oz (61.4 kg)   LMP 03/17/1999 (Approximate)   BMI 23.99 kg/m   Wt Readings from Last 3 Encounters:  07/18/21 135 lb 6.4 oz (61.4 kg)  07/16/21 142 lb (64.4 kg)  06/15/21 143 lb (64.9 kg)   Pleasant female, in no distress.  Slightly irritable when struggling to pin down the history, when she was confused.   HEENT: conjunctiva and sclera are clear, EOMI, wearing mask. PERRL. Head atraumatic.  Mask was pulled down--noted to have ecchymosis above R upper lip.  Mouth/lips are very  dry. Neck: no lymphadenopathy or mass. C-spine nontender, no muscle spasm, though slightly tender over muscles bilaterally inferiorly at neck. Heart: regular rate and rhythm Lungs: clear bilaterally Abdomen: soft, nontender Back: no spinal or CVA tenderness Neuro: alert, oriented x 3.  Intermittently confused in telling timeline/history.  Mild tremor noted at BUE.  Today she was still able to stand from chair without assistance (without using arms/armrest).  She is walking with a walker today. Psych: normal mood, full range of affect, normal hygiene and grooming.  Reviewed studies from ER--negative c-spine, maxillofacial and head CT.  Labs reviewed.   ASSESSMENT/PLAN:  Nausea -  likely related to mild concussion. She appears dehydrated.  Rx zofran, encouraged fluid and food intake - Plan: ondansetron (ZOFRAN ODT) 4 MG disintegrating tablet  Need for influenza vaccination - Plan: Flu Vaccine QUAD High Dose(Fluad)  Fall, initial encounter - f/u from ER 10/1.  Reviewed ER w/u. Needs home PT as previously ordered. Pt states she will call them back to set up  Concussion without loss of consciousness, initial encounter - Reviewed sx of concussion, and treatment, including brain rest.  If any worsening neuro sx, encouraged to f/u with neuro sooner than her November appt  Benign essential tremor - This appears stable, and not particularly bothersome to pt at the moment. Has been taken off both BB and klonopin.

## 2021-07-20 ENCOUNTER — Telehealth: Payer: Self-pay | Admitting: *Deleted

## 2021-07-20 ENCOUNTER — Other Ambulatory Visit: Payer: Self-pay | Admitting: *Deleted

## 2021-07-20 DIAGNOSIS — Z7409 Other reduced mobility: Secondary | ICD-10-CM

## 2021-07-20 DIAGNOSIS — R42 Dizziness and giddiness: Secondary | ICD-10-CM

## 2021-07-20 NOTE — Telephone Encounter (Signed)
Patient's friend Kendal Hymen called. She went to the patient's house today and patient has not gotten out of her bed in 2 days. She is experiencing severe dizziness and won't get up. Edie said her room was a mess and her bed was soiled. She was unsure if Aamani had eaten in days. She also said patient seemed very confused due to having a concussion from her recent fall. She was very uspet and wanted to know if we could get a Child psychotherapist out there ASAP.

## 2021-07-20 NOTE — Telephone Encounter (Signed)
Referral put in. Apparently EMS was called yesterday, they told me this when I called back. Patient is also asking for a referral to Dr. Suszanne Conners for her vertigo.

## 2021-07-20 NOTE — Telephone Encounter (Signed)
Ok for referral to [employer]. She may also want to try and get a sooner appt with her neurologist to discuss. I'm concerned she is very dehydrated (she looked like it on Monday)--if she hasn't been eating and drinking much since then, it'll only get worse, and potentially really hurt her kidneys, and can worsen her dizziness and risk for falls.

## 2021-07-20 NOTE — Telephone Encounter (Signed)
I just cosigned the SW order, and looks like it was done routine. Should be marked as urgent. Thanks

## 2021-07-21 ENCOUNTER — Telehealth: Payer: Self-pay | Admitting: *Deleted

## 2021-07-21 ENCOUNTER — Telehealth: Payer: Medicare HMO

## 2021-07-21 ENCOUNTER — Ambulatory Visit: Payer: Self-pay

## 2021-07-21 ENCOUNTER — Telehealth: Payer: Self-pay

## 2021-07-21 ENCOUNTER — Ambulatory Visit (INDEPENDENT_AMBULATORY_CARE_PROVIDER_SITE_OTHER): Payer: Medicare HMO

## 2021-07-21 ENCOUNTER — Other Ambulatory Visit: Payer: Self-pay | Admitting: *Deleted

## 2021-07-21 ENCOUNTER — Other Ambulatory Visit: Payer: Self-pay

## 2021-07-21 DIAGNOSIS — G250 Essential tremor: Secondary | ICD-10-CM

## 2021-07-21 DIAGNOSIS — F325 Major depressive disorder, single episode, in full remission: Secondary | ICD-10-CM

## 2021-07-21 DIAGNOSIS — R410 Disorientation, unspecified: Secondary | ICD-10-CM

## 2021-07-21 DIAGNOSIS — R42 Dizziness and giddiness: Secondary | ICD-10-CM

## 2021-07-21 DIAGNOSIS — E7800 Pure hypercholesterolemia, unspecified: Secondary | ICD-10-CM

## 2021-07-21 NOTE — Patient Instructions (Signed)
Social Worker Visit Information  Goals we discussed today:   Goals Addressed             This Visit's Progress    Barriers to Treatment Identified and Managed       Timeframe:  Short-Term Goal Priority:  High Start Date:     10.6.22                                              Patient Goals/Self-Care Activities patient will:   - Attend neurology appointment to assess dizziness and memory loss -Engage with embedded RN Care Manager for future initial clinical assessment         Materials provided: Verbal education about care management program provided by phone  Ms. Hausman was given information about Chronic Care Management services today including:  CCM service includes personalized support from designated clinical staff supervised by her physician, including individualized plan of care and coordination with other care providers 24/7 contact phone numbers for assistance for urgent and routine care needs. Service will only be billed when office clinical staff spend 20 minutes or more in a month to coordinate care. Only one practitioner may furnish and bill the service in a calendar month. The patient may stop CCM services at any time (effective at the end of the month) by phone call to the office staff. The patient will be responsible for cost sharing (co-pay) of up to 20% of the service fee (after annual deductible is met).  Patient agreed to services and verbal consent obtained.   Patient verbalizes understanding of instructions provided today and agrees to view in [place].   Follow up plan: SW will follow up with patient by phone over the next 10 days   Daneen Schick, BSW, CDP Social Worker, Certified Dementia Practitioner [facility] [phone]

## 2021-07-21 NOTE — Telephone Encounter (Signed)
Changed to urgent.

## 2021-07-21 NOTE — Chronic Care Management (AMB) (Signed)
Chronic Care Management   CCM RN Visit Note  07/21/2021 Name: Lindsey Frank MRN: [id number] DOB: 05-26-1946  Subjective: Lindsey Frank is a 75 y.o. year old female who is a primary care patient of Lindsey Ohara, MD. The care management team was consulted for assistance with disease management and care coordination needs.    Collaboration with Lindsey Frank BSW  for  Case Collaboration   in response to provider referral for case management and/or care coordination services.   Consent to Services:  The patient was given the following information about Chronic Care Management services today, agreed to services, and gave verbal consent: 1. CCM service includes personalized support from designated clinical staff supervised by the primary care provider, including individualized plan of care and coordination with other care providers 2. 24/7 contact phone numbers for assistance for urgent and routine care needs. 3. Service will only be billed when office clinical staff spend 20 minutes or more in a month to coordinate care. 4. Only one practitioner may furnish and bill the service in a calendar month. 5.The patient may stop CCM services at any time (effective at the end of the month) by phone call to the office staff. 6. The patient will be responsible for cost sharing (co-pay) of up to 20% of the service fee (after annual deductible is met). Patient agreed to services and consent obtained.  Patient agreed to services and verbal consent obtained.   Assessment: Review of patient past medical history, allergies, medications, health status, including review of consultants reports, laboratory and other test data, was performed as part of comprehensive evaluation and provision of chronic care management services.   SDOH (Social Determinants of Health) assessments and interventions performed:    CCM Care Plan  Allergies  Allergen Reactions   Trazodone And Nefazodone Nausea Only   Mysoline  [Primidone] Nausea Only    Nausea and achey    Outpatient Encounter Medications as of 07/21/2021  Medication Sig Note   acetaminophen (TYLENOL) 500 MG tablet Take 1,000 mg by mouth every 6 (six) hours as needed for moderate pain or headache. (Patient not taking: Reported on 07/18/2021)    Calcium Carbonate-Vitamin D 600-200 MG-UNIT TABS Take 600 mg by mouth 2 (two) times daily. (Patient not taking: Reported on 07/18/2021)    diphenhydramine-acetaminophen (TYLENOL PM) 25-500 MG TABS tablet Take 2 tablets by mouth at bedtime as needed (sleep).    naproxen sodium (ALEVE) 220 MG tablet Take 440 mg by mouth as needed. 07/18/2021: Last dose 9am this morning.    omeprazole (PRILOSEC) 20 MG capsule Take 20 mg by mouth daily.    ondansetron (ZOFRAN ODT) 4 MG disintegrating tablet Take 1 tablet (4 mg total) by mouth every 8 (eight) hours as needed for nausea or vomiting.    rosuvastatin (CRESTOR) 40 MG tablet Take 1 tablet (40 mg total) by mouth daily. (Patient taking differently: Take 40 mg by mouth at bedtime.)    sertraline (ZOLOFT) 50 MG tablet Take 1 tablet (50 mg total) by mouth daily.    vitamin B-12 (CYANOCOBALAMIN) 1000 MCG tablet Take 1 tablet (1,000 mcg total) by mouth daily.    No facility-administered encounter medications on file as of 07/21/2021.    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  Depressive disorder, not elsewhere classified 03/30/2011   Benign essential tremor 02/27/2011   Pure hypercholesterolemia 02/27/2011   Migraine     Conditions to be addressed/monitored: Benign essential tremor, Depression, Pure Hyperlipidemia, Dizziness, Vertigo   Care Plan : Assist with Chronic Care Management and Care Coordination needs   Updates made by Lindsey Logan, RN since 07/21/2021 12:00 AM     Problem: Assist with Chronic Care Management and Care Coordination needs   Priority: High     Long-Range Goal: Assist with Chronic Care Management and Care Coordination needs   Start Date: 07/21/2021  Expected End Date: 09/14/2021  This Visit's Progress: On track  Priority: High  Note:   Current Barriers:  Chronic Disease Management support, education, chronic care management and care coordination needs related to Benign essential tremor, Depression, Pure Hyperlipidemia, Dizziness, Vertigo with RNCM, SW and Pharmacy Care Management and Care coordination needs Case Manager Clinical Goal(s):  Patient will work with the CCM team to address needs related to chronic care management and care coordination needs related to Benign essential tremor, Depression, Pure Hyperlipidemia, Dizziness, Vertigo with RNCM, SW and Pharmacy Care Management and Care Coordination needs Interventions:  Collaborated with BSW to initiate plan of care to address needs related to chronic care management and care coordination needs related to Benign essential tremor, Depression, Pure Hyperlipidemia, Dizziness, Vertigo with RNCM, SW and Pharmacy Care Management and Care Coordination needs Collaboration with Lindsey Chard, MD regarding development and update of comprehensive plan of care as evidenced by provider attestation and co-signature Inter-disciplinary care team collaboration (see longitudinal plan of care) Patient Goals/Self-Care Activities patient will:   - Patient will work with the CCM team to address chronic care management and care coordination needs and will continue to work with the clinical team to address health care and disease management related needs.    Follow Up Plan: The care management team will reach out to the patient again over the next 30-45 days.        Plan:Telephone follow up appointment with care management team member  scheduled for:  07/26/21  Lindsey Merino, RN, BSN, CCM Care Management Coordinator [facility]  Direct Phone: [phone]

## 2021-07-21 NOTE — Telephone Encounter (Signed)
Please put in urgent referral--can state is a current pt, but needs to be sooner than routine 08/2021 appt due to continued falls, vertigo, and confusion. Recent ER visit

## 2021-07-21 NOTE — Chronic Care Management (AMB) (Signed)
  Chronic Care Management   Note  07/21/2021 Name: NATALEIGH GRIFFIN MRN: [id number] DOB: 01/08/1946  [name] is a 75 y.o. year old female who is a primary care patient of Rita Ohara, MD. I reached out to [employer] by phone today in response to a referral sent by Ms. Kittie A Ralph's PCP.  Ms. Wonnacott was given information about Chronic Care Management services today including:  CCM service includes personalized support from designated clinical staff supervised by her physician, including individualized plan of care and coordination with other care providers 24/7 contact phone numbers for assistance for urgent and routine care needs. Service will only be billed when office clinical staff spend 20 minutes or more in a month to coordinate care. Only one practitioner may furnish and bill the service in a calendar month. The patient may stop CCM services at any time (effective at the end of the month) by phone call to the office staff. The patient is responsible for co-pay (up to 20% after annual deductible is met) if co-pay is required by the individual health plan.   Patient agreed to services and verbal consent obtained.   Follow up plan: Telephone appointment with care management team member scheduled for:07/21/21  [facility]  Direct Dial: [phone]

## 2021-07-21 NOTE — Telephone Encounter (Signed)
Called patient and let her know that urgent SW referral done as well as referral to Dr. Suszanne Conners. Also let her know that  Dr. Lynelle Doctor recommended that she call [facility] and try to move up her 11/9 appt. Patient verbalized understanding.

## 2021-07-21 NOTE — Telephone Encounter (Signed)
Pt. Son called stating they just talked to you about moving her apt at [facility] up. She has an apt. There on 08/24/21 and needs to get in sooner she has a lot of stuff going on. So he called them and they told him they needed a referral from Dr. Lynelle Doctor.

## 2021-07-21 NOTE — Telephone Encounter (Signed)
Referral done and left message informing patient's son, [name].

## 2021-07-21 NOTE — Telephone Encounter (Signed)
I am assuming they want an urgent referral?

## 2021-07-21 NOTE — Chronic Care Management (AMB) (Signed)
Chronic Care Management    Social Work Note  07/21/2021 Name: Lindsey Frank MRN: [id number] DOB: 01-06-1946  Lindsey Frank is a 75 y.o. year old female who is a primary care patient of Rita Ohara, MD. The CCM team was consulted to assist the patient with chronic disease management and/or care coordination needs related to:  Benign Essential Tremor, Depression, Pure Hyperlipidemia, Dizziness, and Vertigo .   Engaged with patient by telephone for initial visit in response to provider referral for social work chronic care management and care coordination services.   Consent to Services:  The patient was given the following information about Chronic Care Management services today, agreed to services, and gave verbal consent: 1. CCM service includes personalized support from designated clinical staff supervised by the primary care provider, including individualized plan of care and coordination with other care providers 2. 24/7 contact phone numbers for assistance for urgent and routine care needs. 3. Service will only be billed when office clinical staff spend 20 minutes or more in a month to coordinate care. 4. Only one practitioner may furnish and bill the service in a calendar month. 5.The patient may stop CCM services at any time (effective at the end of the month) by phone call to the office staff. 6. The patient will be responsible for cost sharing (co-pay) of up to 20% of the service fee (after annual deductible is met). Patient agreed to services and consent obtained.  Patient agreed to services and consent obtained.   Assessment: Review of patient past medical history, allergies, medications, and health status, including review of relevant consultants reports was performed today as part of a comprehensive evaluation and provision of chronic care management and care coordination services.     SDOH (Social Determinants of Health) assessments and interventions performed:    Advanced  Directives Status: Not addressed in this encounter.  CCM Care Plan  Allergies  Allergen Reactions   Trazodone And Nefazodone Nausea Only   Mysoline [Primidone] Nausea Only    Nausea and achey    Outpatient Encounter Medications as of 07/21/2021  Medication Sig Note   acetaminophen (TYLENOL) 500 MG tablet Take 1,000 mg by mouth every 6 (six) hours as needed for moderate pain or headache. (Patient not taking: Reported on 07/18/2021)    Calcium Carbonate-Vitamin D 600-200 MG-UNIT TABS Take 600 mg by mouth 2 (two) times daily. (Patient not taking: Reported on 07/18/2021)    diphenhydramine-acetaminophen (TYLENOL PM) 25-500 MG TABS tablet Take 2 tablets by mouth at bedtime as needed (sleep).    naproxen sodium (ALEVE) 220 MG tablet Take 440 mg by mouth as needed. 07/18/2021: Last dose 9am this morning.    omeprazole (PRILOSEC) 20 MG capsule Take 20 mg by mouth daily.    ondansetron (ZOFRAN ODT) 4 MG disintegrating tablet Take 1 tablet (4 mg total) by mouth every 8 (eight) hours as needed for nausea or vomiting.    rosuvastatin (CRESTOR) 40 MG tablet Take 1 tablet (40 mg total) by mouth daily. (Patient taking differently: Take 40 mg by mouth at bedtime.)    sertraline (ZOLOFT) 50 MG tablet Take 1 tablet (50 mg total) by mouth daily.    vitamin B-12 (CYANOCOBALAMIN) 1000 MCG tablet Take 1 tablet (1,000 mcg total) by mouth daily.    No facility-administered encounter medications on file as of 07/21/2021.    Patient Active Problem List   Diagnosis Date Noted   Impaired fasting glucose 07/04/2016   Depression, major, in remission ([place]) 11/17/2013  GERD (gastroesophageal reflux disease) 05/14/2013   Tobacco use disorder 04/24/2012   Mixed hyperlipidemia 10/26/2011   Pure hyperglyceridemia 03/30/2011   Depressive disorder, not elsewhere classified 03/30/2011   Benign essential tremor 02/27/2011   Pure hypercholesterolemia 02/27/2011   Migraine     Conditions to be addressed/monitored:  Benign  Essential Tremor, Depression, Pure Hyperlipidemia, Dizziness, and Vertigo ; Memory Deficits  Care Plan : Social Work [place]  Updates made by Daneen Schick since 07/21/2021 12:00 AM     Problem: Barriers to Treatment      Goal: Barriers to Treatment Identified and Managed   Start Date: 07/21/2021  Priority: High  Note:   Current Barriers:  Chronic disease management support and education needs related to  Benign Essential Tremor, Depression, Pure Hyperlipidemia, Dizziness, and Vertigo   Memory Deficits - patient poor historian, unable to complete the call  Social Worker Clinical Goal(s):  patient will work with SW to identify and address any acute and/or chronic care coordination needs related to the self health management of  Benign Essential Tremor, Depression, Pute Hyperlipidemia, Dizziness, and Vertigo   Patient will engage with RN Care Manager to develop an individualized plan of care to address disease management needs  SW Interventions:  Inter-disciplinary care team collaboration (see longitudinal plan of care) Collaboration with Rita Ohara, MD regarding development and update of comprehensive plan of care as evidenced by provider attestation and co-signature Successful outbound call placed to the patient to assess for SW needs following recent referral to care management team Determined patient unable to provide much history - patient reports she has been to the ED x 3 in the past week but chart review indicates patient has only been seen once Patient is unable to provide her address when asked  Performed chart review to note patient recent FL2 sent to [facility] and recent ED provider notes patient came from ALF - patient informed SW she does not live in ALF but lives in a [place] for local friends or family to assist the patient with care needs while she is experiencing dizziness - patient states she has a friend but is unsure where this person is at  this time Advised the patient SW would collaborate with her primary care provider to see if home health is needed at this time to provide support to the patient in the home Performed chart review to note a recent stat neurology placed to obtain an appointment to assess patient dizziness  Noted patient seen by Dr. Rexene Alberts at [facility] 8/822 for dizziness and memory loss During today's call patient indicates the dizziness has only been occurring for 1 week Discussed plan for SW to collaborate with primary provider and contact the patient at a later time Collaboration with [place] to discuss patient enrollment status in care management program and concern with memory deficit Collaboration with Dr. Tomi Bamberger to advise of patients inability to complete today's call and concern patient may be experiencing dementia Noted provider referral asked for an in home visit- this SW does not perform in home visits. Inquired if provider would like to place a home health referral  Patient Goals/Self-Care Activities patient will:   -  Attend neurology appointment to assess dizziness and memory loss -Engage with embedded RN Care Manager for future initial clinical assessment  Follow Up Plan:  SW will continue to follow       Follow Up Plan: SW will follow up with patient by phone  over the next 10 days      Daneen Schick, BSW, CDP Social Worker, Certified Dementia Practitioner [place] / [place] [facility] [phone]

## 2021-07-22 ENCOUNTER — Ambulatory Visit: Payer: Self-pay

## 2021-07-22 ENCOUNTER — Telehealth: Payer: Self-pay | Admitting: Internal Medicine

## 2021-07-22 DIAGNOSIS — R410 Disorientation, unspecified: Secondary | ICD-10-CM

## 2021-07-22 DIAGNOSIS — G3184 Mild cognitive impairment, so stated: Secondary | ICD-10-CM | POA: Diagnosis not present

## 2021-07-22 DIAGNOSIS — Z885 Allergy status to narcotic agent status: Secondary | ICD-10-CM | POA: Diagnosis not present

## 2021-07-22 DIAGNOSIS — F329 Major depressive disorder, single episode, unspecified: Secondary | ICD-10-CM | POA: Diagnosis not present

## 2021-07-22 DIAGNOSIS — R2689 Other abnormalities of gait and mobility: Secondary | ICD-10-CM

## 2021-07-22 DIAGNOSIS — K219 Gastro-esophageal reflux disease without esophagitis: Secondary | ICD-10-CM | POA: Diagnosis not present

## 2021-07-22 DIAGNOSIS — R42 Dizziness and giddiness: Secondary | ICD-10-CM

## 2021-07-22 DIAGNOSIS — Z7409 Other reduced mobility: Secondary | ICD-10-CM | POA: Diagnosis not present

## 2021-07-22 DIAGNOSIS — E785 Hyperlipidemia, unspecified: Secondary | ICD-10-CM | POA: Diagnosis not present

## 2021-07-22 DIAGNOSIS — F325 Major depressive disorder, single episode, in full remission: Secondary | ICD-10-CM

## 2021-07-22 DIAGNOSIS — I739 Peripheral vascular disease, unspecified: Secondary | ICD-10-CM | POA: Diagnosis not present

## 2021-07-22 DIAGNOSIS — E782 Mixed hyperlipidemia: Secondary | ICD-10-CM

## 2021-07-22 DIAGNOSIS — R296 Repeated falls: Secondary | ICD-10-CM

## 2021-07-22 DIAGNOSIS — Z008 Encounter for other general examination: Secondary | ICD-10-CM | POA: Diagnosis not present

## 2021-07-22 DIAGNOSIS — R69 Illness, unspecified: Secondary | ICD-10-CM | POA: Diagnosis not present

## 2021-07-22 DIAGNOSIS — R030 Elevated blood-pressure reading, without diagnosis of hypertension: Secondary | ICD-10-CM | POA: Diagnosis not present

## 2021-07-22 DIAGNOSIS — F419 Anxiety disorder, unspecified: Secondary | ICD-10-CM | POA: Diagnosis not present

## 2021-07-22 DIAGNOSIS — W19XXXA Unspecified fall, initial encounter: Secondary | ICD-10-CM

## 2021-07-22 DIAGNOSIS — Z888 Allergy status to other drugs, medicaments and biological substances status: Secondary | ICD-10-CM | POA: Diagnosis not present

## 2021-07-22 DIAGNOSIS — G250 Essential tremor: Secondary | ICD-10-CM
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
## 2021-07-22 NOTE — Patient Instructions (Signed)
Social Worker Visit Information  Goals we discussed today:   Goals Addressed             This Visit's Progress    Barriers to Treatment Identified and Managed       Timeframe:  Short-Term Goal Priority:  High Start Date:     10.6.22                                              Patient Goals/Self-Care Activities patient will:  -Increase fluid intake  - Attend neurology appointment to assess dizziness and memory loss -Engage with home health services -Engage with embedded RN Care Manager for future initial clinical assessment         Materials Provided: Verbal education about caregiver resources provided by phone to [employer]  Patient verbalizes understanding of instructions provided today and agrees to view in [place].   Follow Up Plan: SW will follow up with patient by phone over the next 10 days   Bevelyn Ngo, BSW, CDP Social Worker, Certified Dementia Practitioner [facility] / [facility] Care Management [phone]

## 2021-07-22 NOTE — Telephone Encounter (Signed)
I have put a referral in to Home health and sent it to [facility]

## 2021-07-22 NOTE — Chronic Care Management (AMB) (Addendum)
Chronic Care Management    Social Work Note  07/22/2021 Name: Lindsey Frank MRN: [id number] DOB: 06/16/1946  Lindsey Frank is a 75 y.o. year old female who is a primary care patient of [name], MD. The CCM team was consulted to assist the patient with chronic disease management and/or care coordination needs related to:  Benign Essential Tremor, Depression, Mixed Hyperlipidemia, Dizziness, Vertigo .   Engaged with [name] by phone  for follow up visit in response to provider referral for social work chronic care management and care coordination services.   Consent to Services:  The patient was given information about Chronic Care Management services, agreed to services, and gave verbal consent prior to initiation of services.  Please see initial visit note for detailed documentation.   Patient agreed to services and consent obtained.   Assessment: Review of patient past medical history, allergies, medications, and health status, including review of relevant consultants reports was performed today as part of a comprehensive evaluation and provision of chronic care management and care coordination services.     SDOH (Social Determinants of Health) assessments and interventions performed:  SDOH Interventions    Flowsheet Row Most Recent Value  SDOH Interventions   Food Insecurity Interventions Intervention Not Indicated  Housing Interventions Intervention Not Indicated  Transportation Interventions Intervention Not Indicated        Advanced Directives Status: Not addressed in this encounter.  CCM Care Plan  Allergies  Allergen Reactions   Trazodone And Nefazodone Nausea Only   Mysoline [Primidone] Nausea Only    Nausea and achey    Outpatient Encounter Medications as of 07/22/2021  Medication Sig Note   acetaminophen (TYLENOL) 500 MG tablet Take 1,000 mg by mouth every 6 (six) hours as needed for moderate pain or headache. (Patient not taking: Reported on  07/18/2021)    Calcium Carbonate-Vitamin D 600-200 MG-UNIT TABS Take 600 mg by mouth 2 (two) times daily. (Patient not taking: Reported on 07/18/2021)    diphenhydramine-acetaminophen (TYLENOL PM) 25-500 MG TABS tablet Take 2 tablets by mouth at bedtime as needed (sleep).    naproxen sodium (ALEVE) 220 MG tablet Take 440 mg by mouth as needed. 07/18/2021: Last dose 9am this morning.    omeprazole (PRILOSEC) 20 MG capsule Take 20 mg by mouth daily.    ondansetron (ZOFRAN ODT) 4 MG disintegrating tablet Take 1 tablet (4 mg total) by mouth every 8 (eight) hours as needed for nausea or vomiting.    rosuvastatin (CRESTOR) 40 MG tablet Take 1 tablet (40 mg total) by mouth daily. (Patient taking differently: Take 40 mg by mouth at bedtime.)    sertraline (ZOLOFT) 50 MG tablet Take 1 tablet (50 mg total) by mouth daily.    vitamin B-12 (CYANOCOBALAMIN) 1000 MCG tablet Take 1 tablet (1,000 mcg total) by mouth daily.    No facility-administered encounter medications on file as of 07/22/2021.    Patient Active Problem List   Diagnosis Date Noted   Impaired fasting glucose 07/04/2016   Depression, major, in remission ([facility]) 11/17/2013   GERD (gastroesophageal reflux disease) 05/14/2013   Tobacco use disorder 04/24/2012   Mixed hyperlipidemia 10/26/2011   Pure hyperglyceridemia 03/30/2011   Depressive disorder, not elsewhere classified 03/30/2011   Benign essential tremor 02/27/2011   Pure hypercholesterolemia 02/27/2011   Migraine     Conditions to be addressed/monitored:  Benign Essential Tremor, Depression, Mixed Hyperlipidemia, Dizziness, Vertigo  Care Plan : Social Work [facility] Care Plan  Updates made by Bevelyn Ngo  since 07/22/2021 12:00 AM     Problem: Barriers to Treatment      Goal: Barriers to Treatment Identified and Managed   Start Date: 07/21/2021  Priority: High  Note:   Current Barriers:  Chronic disease management support and education needs related to  Benign Essential Tremor,  Depression, Pure Hyperlipidemia, Dizziness, and Vertigo   Memory Deficits - patient poor historian, unable to complete the call  Social Worker Clinical Goal(s):  patient will work with SW to identify and address any acute and/or chronic care coordination needs related to the self health management of  Benign Essential Tremor, Depression, Pute Hyperlipidemia, Dizziness, and Vertigo   Patient will engage with RN Care Manager to develop an individualized plan of care to address disease management needs  SW Interventions:  Inter-disciplinary care team collaboration (see longitudinal plan of care) Collaboration with [name], MD regarding development and update of comprehensive plan of care as evidenced by provider attestation and co-signature Collaboration with Dr. Lynelle Doctor to discuss patient care management needs Determined Dr. Lynelle Doctor would like to proceed with an urgent home health referral Performed chart review to note referral sent to [facility] Successful call placed to [facility] spoke with Luther Parody who reports she has not received a new patient referral - Luther Parody requests the practice re-send referral via fax to [phone] Collaboration with Herminio Commons CMA to request referral be sent via fax to [facility] per Caitlin's request Unsuccessful outbound call placed to the patients son to update on interventions and plan - voice message left requesting a return call Successful outbound call placed to the patients friend/caregiver Marisue Loreli Debruler to advise of Dr. Madaline Savage plan to order home health Discussed Dr. Lynelle Doctor is concerned patients confusion/dizziness may be a result of dehydration Advised [name] to continue to encourage the patient to drink water to help combat possibility of dehydration Encouraged [name] to listen for an inbound call from home health agency to schedule an initial home visit  Discussed the patient is in need of care in  the home Provided education on the difference in home health and private duty caregivers - advised home health is a short term program covered by Medicare while a private duty caregiver is paid for out of pocket  Advised a long term caregiver can be paid for by Medicaid if patient qualifies - Mrs. Creta Levin indicates patient will not qualify for Medicaid Mrs. Creta Levin has been in contact with [facility] within the past month to arrange care in the home but patient refused - she will discuss this option with the patient again Discussed plan for ongoing follow up with embedded care management team including this SW and RN Care Manager Lawanna Kobus Little  Encouraged [name] to contact SW as needed Collaboration with Waymon Budge to advise of interventions and plan  Patient Goals/Self-Care Activities patient will:  -Increase fluid intake  -  Attend neurology appointment to assess dizziness and memory loss -Engage with Home Health services -Engage with embedded RN Care Manager for future initial clinical assessment  Follow Up Plan:  SW will continue to follow       Follow Up Plan: SW will follow up with patient by phone over the next 10 days      Bevelyn Ngo, BSW, CDP Social Worker, Certified Dementia Practitioner [facility] / [facility] Care Management [phone]

## 2021-07-24 ENCOUNTER — Other Ambulatory Visit: Payer: Self-pay

## 2021-07-24 ENCOUNTER — Emergency Department (HOSPITAL_COMMUNITY)
Admission: EM | Admit: 2021-07-24 | Discharge: 2021-07-25 | Disposition: A | Discharge status: Home / Self Care | Payer: Medicare HMO | Attending: Emergency Medicine | Admitting: Emergency Medicine

## 2021-07-24 DIAGNOSIS — I10 Essential (primary) hypertension: Secondary | ICD-10-CM | POA: Diagnosis not present

## 2021-07-24 DIAGNOSIS — E860 Dehydration: Secondary | ICD-10-CM | POA: Diagnosis not present

## 2021-07-24 DIAGNOSIS — R531 Weakness: Secondary | ICD-10-CM | POA: Insufficient documentation

## 2021-07-24 DIAGNOSIS — R519 Headache, unspecified: Secondary | ICD-10-CM | POA: Insufficient documentation

## 2021-07-24 DIAGNOSIS — Z87891 Personal history of nicotine dependence: Secondary | ICD-10-CM | POA: Insufficient documentation

## 2021-07-24 DIAGNOSIS — Z743 Need for continuous supervision: Secondary | ICD-10-CM | POA: Diagnosis not present

## 2021-07-24 DIAGNOSIS — W01198A Fall on same level from slipping, tripping and stumbling with subsequent striking against other object, initial encounter: Secondary | ICD-10-CM | POA: Diagnosis not present

## 2021-07-24 DIAGNOSIS — I959 Hypotension, unspecified: Secondary | ICD-10-CM | POA: Diagnosis not present

## 2021-07-24 DIAGNOSIS — R202 Paresthesia of skin: Secondary | ICD-10-CM | POA: Insufficient documentation

## 2021-07-24 DIAGNOSIS — R410 Disorientation, unspecified: Secondary | ICD-10-CM | POA: Insufficient documentation

## 2021-07-24 DIAGNOSIS — K5900 Constipation, unspecified: Secondary | ICD-10-CM

## 2021-07-24 DIAGNOSIS — R110 Nausea: Secondary | ICD-10-CM | POA: Insufficient documentation

## 2021-07-24 LAB — CBC WITH DIFFERENTIAL/PLATELET
Abs Immature Granulocytes: 0.06 10*3/uL (ref 0.00–0.07)
Basophils Absolute: 0.1 10*3/uL (ref 0.0–0.1)
Basophils Relative: 0 %
Eosinophils Absolute: 0.1 10*3/uL (ref 0.0–0.5)
Eosinophils Relative: 1 %
HCT: 43.9 % (ref 36.0–46.0)
Hemoglobin: 14.5 g/dL (ref 12.0–15.0)
Immature Granulocytes: 1 %
Lymphocytes Relative: 18 %
Lymphs Abs: 2.1 10*3/uL (ref 0.7–4.0)
MCH: 28.3 pg (ref 26.0–34.0)
MCHC: 33 g/dL (ref 30.0–36.0)
MCV: 85.7 fL (ref 80.0–100.0)
Monocytes Absolute: 1 10*3/uL (ref 0.1–1.0)
Monocytes Relative: 8 %
Neutro Abs: 8.4 10*3/uL — ABNORMAL HIGH (ref 1.7–7.7)
Neutrophils Relative %: 72 %
Platelets: 299 10*3/uL (ref 150–400)
RBC: 5.12 MIL/uL — ABNORMAL HIGH (ref 3.87–5.11)
RDW: 14.8 % (ref 11.5–15.5)
WBC: 11.7 10*3/uL — ABNORMAL HIGH (ref 4.0–10.5)
nRBC: 0 % (ref 0.0–0.2)

## 2021-07-24 LAB — COMPREHENSIVE METABOLIC PANEL
ALT: 17 U/L (ref 0–44)
AST: 21 U/L (ref 15–41)
Albumin: 4.5 g/dL (ref 3.5–5.0)
Alkaline Phosphatase: 55 U/L (ref 38–126)
Anion gap: 9 (ref 5–15)
BUN: 17 mg/dL (ref 8–23)
CO2: 28 mmol/L (ref 22–32)
Calcium: 9.9 mg/dL (ref 8.9–10.3)
Chloride: 101 mmol/L (ref 98–111)
Creatinine, Ser: 0.99 mg/dL (ref 0.44–1.00)
GFR, Estimated: 60 mL/min — ABNORMAL LOW (ref >60)
Glucose, Bld: 121 mg/dL — ABNORMAL HIGH (ref 70–99)
Potassium: 3.7 mmol/L (ref 3.5–5.1)
Sodium: 138 mmol/L (ref 135–145)
Total Bilirubin: 0.7 mg/dL (ref 0.3–1.2)
Total Protein: 6.9 g/dL (ref 6.5–8.1)

## 2021-07-24 NOTE — ED Provider Notes (Signed)
Emergency Medicine Provider Triage Evaluation Note  Lindsey Frank , a 75 y.o. female  was evaluated in triage.  Pt complains of decrease in mobility.  She says that she has also been constipated.  She states that she is having tingling in her toes.  This is not new.  She is having ongoing leg weakness.  She, per chart review, it appears that she is being set up with home health however she says that she currently cannot take care of herself.  She does not voice any acute complaints or concerns otherwise.  Review of Systems  Positive: Decreased mobility, worsened chronic weakness Negative: Fevers, abdominal pain, any new back pain  Physical Exam  BP (!) 139/110   Pulse 75   Temp (!) 97.4 F (36.3 C)   Resp 16   Ht 5\' 3"  (1.6 m)   Wt 64.4 kg   LMP 03/17/1999 (Approximate)   SpO2 99%   BMI 25.15 kg/m  Gen:   Awake, no distress   Resp:  Normal effort  MSK:   Moves extremities without difficulty is able to lift bilateral legs without apparent difficulty. Other:  Tremor present  Medical Decision Making  Medically screening exam initiated at 9:19 PM.  Appropriate orders placed.  Nikeya A Anstead was informed that the remainder of the evaluation will be completed by another provider, this initial triage assessment does not replace that evaluation, and the importance of remaining in the ED until their evaluation is complete.  Patient presents today for ongoing decreased mobility.  It appears that she is supposed to get set up with home health possibly starting tomorrow. She does not voice any new/acute complaints or concerns or have any new pains.    Note: Portions of this report may have been transcribed using voice recognition software. Every effort was made to ensure accuracy; however, inadvertent computerized transcription errors may be present    05/17/1999 07/24/21 2121    2122, MD 07/25/21 2317

## 2021-07-24 NOTE — ED Triage Notes (Signed)
Pt brought in by EMS for decreased mobility, bilateral lower extremities weakness, and constipation.

## 2021-07-25 ENCOUNTER — Emergency Department (HOSPITAL_COMMUNITY): Payer: Medicare HMO

## 2021-07-25 ENCOUNTER — Ambulatory Visit: Payer: Self-pay

## 2021-07-25 DIAGNOSIS — R519 Headache, unspecified: Secondary | ICD-10-CM | POA: Diagnosis not present

## 2021-07-25 DIAGNOSIS — G250 Essential tremor: Secondary | ICD-10-CM

## 2021-07-25 DIAGNOSIS — E782 Mixed hyperlipidemia: Secondary | ICD-10-CM

## 2021-07-25 DIAGNOSIS — F325 Major depressive disorder, single episode, in full remission: Secondary | ICD-10-CM

## 2021-07-25 DIAGNOSIS — R410 Disorientation, unspecified: Secondary | ICD-10-CM

## 2021-07-25 DIAGNOSIS — R42 Dizziness and giddiness: Secondary | ICD-10-CM

## 2021-07-25 LAB — URINALYSIS, ROUTINE W REFLEX MICROSCOPIC
Bacteria, UA: NONE SEEN
Bilirubin Urine: NEGATIVE
Glucose, UA: NEGATIVE mg/dL
Ketones, ur: 5 mg/dL — AB
Leukocytes,Ua: NEGATIVE
Nitrite: NEGATIVE
Protein, ur: 30 mg/dL — AB
Specific Gravity, Urine: 1.02 (ref 1.005–1.030)
pH: 5 (ref 5.0–8.0)

## 2021-07-25 LAB — TROPONIN I (HIGH SENSITIVITY): Troponin I (High Sensitivity): 10 ng/L (ref <18)

## 2021-07-25 MED ORDER — LACTATED RINGERS IV BOLUS
1000.0000 mL | Freq: Once | INTRAVENOUS | Status: AC
  Administered 2021-07-25: 1000 mL via INTRAVENOUS

## 2021-07-25 MED ORDER — FLEET ENEMA 7-19 GM/118ML RE ENEM
1.0000 | ENEMA | Freq: Once | RECTAL | Status: AC
  Administered 2021-07-25: 1 via RECTAL
  Filled 2021-07-25: qty 1

## 2021-07-25 MED ORDER — METOCLOPRAMIDE HCL 5 MG/ML IJ SOLN
10.0000 mg | Freq: Once | INTRAMUSCULAR | Status: AC
  Administered 2021-07-25: 10 mg via INTRAVENOUS
  Filled 2021-07-25: qty 2

## 2021-07-25 MED ORDER — ACETAMINOPHEN 500 MG PO TABS
1000.0000 mg | ORAL_TABLET | Freq: Once | ORAL | Status: AC
  Administered 2021-07-25: 1000 mg via ORAL
  Filled 2021-07-25: qty 2

## 2021-07-25 NOTE — Progress Notes (Signed)
CSW spoke with patients social worker, Enrique Sack. Enrique Sack let CSW that patient has a friend [name] who is a local support for her. [name] and the son plan on hiring someone privately to come into the home and care for patient. CSW was advised to reach out to [facility].

## 2021-07-25 NOTE — Chronic Care Management (AMB) (Addendum)
Chronic Care Management    Social Work Note  07/25/2021 Name: Lindsey Frank MRN: [id number] DOB: Apr 02, 1946  Lindsey Frank is a 75 y.o. year old female who is a primary care patient of Lindsey Arrow, MD. The CCM team was consulted to assist the patient with chronic disease management and/or care coordination needs related to:  Benign Essential Tremor, Depression, Mixed Hyperlipidemia, Dizziness, Vertigo .   Collaboration with ED SW Lindsey Frank  for  case collaboration  in response to provider referral for social work chronic care management and care coordination services.   Consent to Services:  The patient was given information about Chronic Care Management services, agreed to services, and gave verbal consent prior to initiation of services.  Please see initial visit note for detailed documentation.   Patient agreed to services and consent obtained.   Assessment: Review of patient past medical history, allergies, medications, and health status, including review of relevant consultants reports was performed today as part of a comprehensive evaluation and provision of chronic care management and care coordination services.     SDOH (Social Determinants of Health) assessments and interventions performed:    Advanced Directives Status: Not addressed in this encounter.  CCM Care Plan  Allergies  Allergen Reactions   Trazodone And Nefazodone Nausea Only   Mysoline [Primidone] Nausea Only    Nausea and achey    Outpatient Encounter Medications as of 07/25/2021  Medication Sig Note   acetaminophen (TYLENOL) 500 MG tablet Take 1,000 mg by mouth every 6 (six) hours as needed for moderate pain or headache. (Patient not taking: Reported on 07/18/2021)    Calcium Carbonate-Vitamin D 600-200 MG-UNIT TABS Take 600 mg by mouth 2 (two) times daily. (Patient not taking: Reported on 07/18/2021)    diphenhydramine-acetaminophen (TYLENOL PM) 25-500 MG TABS tablet Take 2 tablets by mouth at  bedtime as needed (sleep).    naproxen sodium (ALEVE) 220 MG tablet Take 440 mg by mouth as needed. 07/18/2021: Last dose 9am this morning.    omeprazole (PRILOSEC) 20 MG capsule Take 20 mg by mouth daily.    ondansetron (ZOFRAN ODT) 4 MG disintegrating tablet Take 1 tablet (4 mg total) by mouth every 8 (eight) hours as needed for nausea or vomiting.    rosuvastatin (CRESTOR) 40 MG tablet Take 1 tablet (40 mg total) by mouth daily. (Patient taking differently: Take 40 mg by mouth at bedtime.)    sertraline (ZOLOFT) 50 MG tablet Take 1 tablet (50 mg total) by mouth daily.    vitamin B-12 (CYANOCOBALAMIN) 1000 MCG tablet Take 1 tablet (1,000 mcg total) by mouth daily.    No facility-administered encounter medications on file as of 07/25/2021.    Patient Active Problem List   Diagnosis Date Noted   Impaired fasting glucose 07/04/2016   Depression, major, in remission (HCC) 11/17/2013   GERD (gastroesophageal reflux disease) 05/14/2013   Tobacco use disorder 04/24/2012   Mixed hyperlipidemia 10/26/2011   Pure hyperglyceridemia 03/30/2011   Depressive disorder, not elsewhere classified 03/30/2011   Benign essential tremor 02/27/2011   Pure hypercholesterolemia 02/27/2011   Migraine     Conditions to be addressed/monitored:  Benign Essential Tremor, Depression, Mixed Hyperlipidemia, Dizziness, Vertigo ; Limited access to caregiver  Care Plan : Social Work [facility] Care Plan  Updates made by Lindsey Frank since 07/25/2021 12:00 AM     Problem: Barriers to Treatment      Goal: Barriers to Treatment Identified and Managed   Start Date: 07/21/2021  Priority: High  Note:   Current Barriers:  Chronic disease management support and education needs related to  Benign Essential Tremor, Depression, Pure Hyperlipidemia, Dizziness, and Vertigo   Memory Deficits - patient poor historian, unable to complete the call  Social Worker Clinical Goal(s):  patient will work with SW to identify and address  any acute and/or chronic care coordination needs related to the self health management of  Benign Essential Tremor, Depression, [place] Hyperlipidemia, Dizziness, and Vertigo   Patient will engage with RN Care Manager to develop an individualized plan of care to address disease management needs  SW Interventions:  Inter-disciplinary care team collaboration (see longitudinal plan of care) Collaboration with Lindsey Arrow, MD regarding development and update of comprehensive plan of care as evidenced by provider attestation and co-signature Collaboration with Lindsey Frank, ED social worker who indicates patient is currently in the ED Discussed provider is concerned if it is safe to discharge the patient home or if there is an alternative options [name] indicates she attempted to contact the patients son but was unsuccessful Advised SW to contact patients local friend Lindsey Frank for assistance with a safe discharge plan Discussed this Clinical research associate spoke with Lindsey Frank Friday 10/7 to discuss patients care needs in the home - Lindsey Frank stated she would work with the patient and her son to explore private duty options through Risk analyst  Patient Goals/Self-Care Activities patient will:  -Work with ED provider to identify a safe discharge plan -Increase fluid intake  -  Attend neurology appointment to assess dizziness and memory loss -Engage with Home Health services -Engage with embedded RN Care Manager for future initial clinical assessment  Follow Up Plan:  SW will continue to follow       Follow Up Plan: SW will follow up with patient by phone over the next 7 days      Lindsey Frank, Lindsey Frank, CDP Social Worker, Certified Dementia Practitioner [facility] / [facility] Care Management [phone]

## 2021-07-25 NOTE — Progress Notes (Signed)
CSW spoke with patients son, Lindsey Frank who lives in [place]. Patients son stated that Lindsey Frank provides support for his mother in the home. Patient stated he and Lindsey Frank are setting up for [facility] to come into the home and care for his mother. Patients son stated that will hopefully be setup this week. CSW asked patients son if Lindsey Frank was out of town this week. He stated Lindsey Frank was out of town this past weekend. Home health referral was also put in place for patient as well. Patients son had no other questions or concerns.

## 2021-07-25 NOTE — Progress Notes (Signed)
Voicemail left with [name].

## 2021-07-25 NOTE — Patient Instructions (Signed)
Social Worker Visit Information  Goals we discussed today:   Goals Addressed             This Visit's Progress    Barriers to Treatment Identified and Managed       Timeframe:  Short-Term Goal Priority:  High Start Date:     10.6.22                                              Patient Goals/Self-Care Activities patient will:  -Work with ED provider to identify a safe discharge plan -Increase fluid intake  - Attend neurology appointment to assess dizziness and memory loss -Engage with home health services -Engage with embedded RN Care Manager for future initial clinical assessment         Follow Up Plan: SW will follow up with patient by phone over the next 7 days   Bevelyn Ngo, BSW, CDP Social Worker, Certified Dementia Practitioner [facility] / [facility] Care Management [phone]

## 2021-07-25 NOTE — Progress Notes (Signed)
CSW received a call back from [facility] patients friend. [name] stated she is back in town and has assisted patient with her care in the home. Lindsey Frank stated she has a family of her own and works fulltime. Lindsey Frank stated she can't be at patients house everyday to care for her. Lindsey Frank stated that she went over to patients home Friday and bathed, feed, and changed her sheets. Lindsey Frank stated she cannot continue to do this. Lindsey Frank stated she has been working with [facility] to provide care for patient in the home. CSW was told they can call patient today and get her started with her services tomorrow. CSW stated she can go to patients room and make sure she has her phone on her. [facility] is not able to speak with CSW directly. They have to hear from the patient about the services she needs. Lindsey Frank told CSW that she can pick her up today too. [facility] needs patient home today to start her assessment in the afternoon.

## 2021-07-25 NOTE — Discharge Instructions (Signed)
The nausea medicine you are taking could be causing some constipation.  If you continue to take that nausea medicine you should use a stool softener like Colace or Dulcolax or even 1 scoop of MiraLAX daily to prevent further constipation.  All the blood work today looked normal and there is no sign of bleeding on your CAT scan.

## 2021-07-25 NOTE — ED Notes (Signed)
Assisted MD with disimpaction.  Small amount of hard stool noted.

## 2021-07-25 NOTE — ED Provider Notes (Signed)
[name] EMERGENCY DEPARTMENT Provider Note   CSN: [id number] Arrival date & time: 07/24/21  2111     History Chief Complaint  Patient presents with   Extremity Weakness   Constipation    Lindsey Frank is a 76 y.o. female.  Patient is a 75 year old female with a history of migraines, GERD, resting tremor, depression who is returning to the emergency room today due to complaint of feeling numbness on the top of her feet and her feet feeling heavy making it difficult for her to get around her house.  Patient had a fall 9 days ago when her power went out where she fell forward after tripping on something hitting her head.  She was seen at the emergency room at that time and did have some mild confusion but all imaging and labs are relatively normal except for some mild dehydration and bump in creatinine to 1.25.  At that time patient had admitted to not eating and drinking very well.  After fluids patient's labs were otherwise reassuring and she was discharged home after speaking with her son so that he was aware of the situation.  Patient does live alone but does have people that come and check on her. Since discharge from the hospital patient has had ongoing headaches, poor oral intake and has followed up with her doctor.  At that time she did note that her toes felt funny like there was cotton in between them that have been going on for a while.  She was given Zofran because she had complained of continued headache and nausea which was thought to be related to concussion.  No other medication was changed but home health and physical therapy services were initiated which she has not received yet.  She reports that she still has not been eating much but has been taking the nausea medicine.  Now she is complaining of constipation and reports there is stool stuck in her rectum that she cannot get out.  She has ongoing headache at the top of her head and numbness to the top of  bilateral feet.  She has no abdominal pain and denies any vomiting.  She is no longer taking any Klonopin or propranolol.  She last ate 28 to 29 hours ago and did reports she planned on eating last night but she was waiting in the emergency room.  She denies any syncope, chest pain or shortness of breath.  She denies any upper extremity weakness.  The history is provided by the patient.  Extremity Weakness This is a recurrent problem. The current episode started yesterday.  Constipation     Past Medical History:  Diagnosis Date   Abnormal Pap smear of cervix 2019   HR HPV + recurrent    Depression    Elevated C-reactive protein (CRP) 01/2005   Elevated cholesterol 01/2005   Elevated triglycerides with high cholesterol 01/2005   GERD (gastroesophageal reflux disease)    History of endometriosis    Migraine    Tremor, essential     Patient Active Problem List   Diagnosis Date Noted   Impaired fasting glucose 07/04/2016   Depression, major, in remission (HCC) 11/17/2013   GERD (gastroesophageal reflux disease) 05/14/2013   Tobacco use disorder 04/24/2012   Mixed hyperlipidemia 10/26/2011   Pure hyperglyceridemia 03/30/2011   Depressive disorder, not elsewhere classified 03/30/2011   Benign essential tremor 02/27/2011   Pure hypercholesterolemia 02/27/2011   Migraine     Past Surgical History:  Procedure  Laterality Date   BREAST LUMPECTOMY Right 1992   small lumpectomy for benign tumor   CATARACT EXTRACTION Left 01/02/2018   Dr. Dagoberto Ligas   COSMETIC SURGERY  2008   neck lift   LAPAROSCOPY  1981   endometriosis   TONSILECTOMY, ADENOIDECTOMY, BILATERAL MYRINGOTOMY AND TUBES  1953   age 29   UTERINE FIBROID SURGERY  1977   with appendectomy     OB History     Gravida  2   Para  1   Term  1   Preterm      AB  1   Living  1      SAB  1   IAB      Ectopic      Multiple      Live Births  1           Family History  Problem Relation Age of Onset    Diabetes Mother    Heart disease Father 71   Stroke Father 56   Throat cancer Father    Depression Brother    Stomach cancer Maternal Aunt    Depression Maternal Uncle        suicide   Alzheimer's disease Cousin     Social History   Tobacco Use   Smoking status: Former    Packs/day: 0.25    Years: 30.00    Pack years: 7.50    Types: Cigarettes   Smokeless tobacco: Never   Tobacco comments:    quit 12/2014, relapsed with stress, quit again end of July 2016  Vaping Use   Vaping Use: Never used  Substance Use Topics   Alcohol use: Yes    Alcohol/week: 0.0 - 1.0 standard drinks    Comment: (occasional [place]), occasionally on weekends   Drug use: No    Home Medications Prior to Admission medications   Medication Sig Start Date End Date Taking? Authorizing Provider  acetaminophen (TYLENOL) 500 MG tablet Take 1,000 mg by mouth every 6 (six) hours as needed for moderate pain or headache. Patient not taking: Reported on 07/18/2021    [provider]  Calcium Carbonate-Vitamin D 600-200 MG-UNIT TABS Take 600 mg by mouth 2 (two) times daily. Patient not taking: Reported on 07/18/2021    [provider]  diphenhydramine-acetaminophen (TYLENOL PM) 25-500 MG TABS tablet Take 2 tablets by mouth at bedtime as needed (sleep).    [provider]  naproxen sodium (ALEVE) 220 MG tablet Take 440 mg by mouth as needed.    [provider]  omeprazole (PRILOSEC) 20 MG capsule Take 20 mg by mouth daily. 06/07/21   [provider]  ondansetron (ZOFRAN ODT) 4 MG disintegrating tablet Take 1 tablet (4 mg total) by mouth every 8 (eight) hours as needed for nausea or vomiting. 07/18/21   [name], MD  rosuvastatin (CRESTOR) 40 MG tablet Take 1 tablet (40 mg total) by mouth daily. Patient taking differently: Take 40 mg by mouth at bedtime. 09/15/20   [name], MD  sertraline (ZOLOFT) 50 MG tablet Take 1 tablet (50 mg total) by mouth daily. 09/15/20    [name], MD  vitamin B-12 (CYANOCOBALAMIN) 1000 MCG tablet Take 1 tablet (1,000 mcg total) by mouth daily. 06/15/21   [name], MD    Allergies    Trazodone and nefazodone and Mysoline [primidone]  Review of Systems   Review of Systems  Gastrointestinal:  Positive for constipation.  Musculoskeletal:  Positive for extremity weakness.  All other  systems reviewed and are negative.  Physical Exam Updated Vital Signs BP 116/65   Pulse 73   Temp 98.7 F (37.1 C) (Oral)   Resp (!) 24   Ht 5\' 3"  (1.6 m)   Wt 64.4 kg   LMP 03/17/1999 (Approximate)   SpO2 100%   BMI 25.15 kg/m   Physical Exam Vitals and nursing note reviewed.  Constitutional:      General: She is not in acute distress.    Appearance: Normal appearance. She is well-developed.  HENT:     Head: Normocephalic and atraumatic.     Right Ear: Tympanic membrane normal.     Nose: Nose normal.     Mouth/Throat:     Mouth: Mucous membranes are moist.  Eyes:     General: No visual field deficit.    Pupils: Pupils are equal, round, and reactive to light.  Cardiovascular:     Rate and Rhythm: Normal rate and regular rhythm.     Pulses: Normal pulses.     Heart sounds: Normal heart sounds. No murmur heard.   No friction rub.  Pulmonary:     Effort: Pulmonary effort is normal.     Breath sounds: Normal breath sounds. No wheezing or rales.  Abdominal:     General: Bowel sounds are normal. There is no distension.     Palpations: Abdomen is soft.     Tenderness: There is no abdominal tenderness. There is no guarding or rebound.  Genitourinary:    Comments: Fecal impaction with hard brown stool Musculoskeletal:        General: No tenderness. Normal range of motion.     Cervical back: Normal range of motion and neck supple.     Right lower leg: No edema.     Left lower leg: No edema.     Comments: No edema  Skin:    General: Skin is warm and dry.     Findings: No rash.  Neurological:     Mental Status: She is  alert and oriented to person, place, and time. Mental status is at baseline.     Cranial Nerves: No cranial nerve deficit or facial asymmetry.     Motor: No weakness.     Coordination: Coordination normal. Heel to Shin Test abnormal.     Comments: Patient reports decreased subjective sensation on the dorsum of bilateral feet but normal sensation in all the toes and in the tib-fib area.  5 out of 5 strength in bilateral lower and upper extremities.  No pronator drift.  Normal heel-to-shin testing bilaterally.  Difficulty standing from the wheelchair but was able to do it independently. No notable confusion at this time.  Psychiatric:        Mood and Affect: Mood normal.        Behavior: Behavior normal.    ED Results / Procedures / Treatments   Labs (all labs ordered are listed, but only abnormal results are displayed) Labs Reviewed  COMPREHENSIVE METABOLIC PANEL - Abnormal; Notable for the following components:      Result Value   Glucose, Bld 121 (*)    GFR, Estimated 60 (*)    All other components within normal limits  CBC WITH DIFFERENTIAL/PLATELET - Abnormal; Notable for the following components:   WBC 11.7 (*)    RBC 5.12 (*)    Neutro Abs 8.4 (*)    All other components within normal limits  URINALYSIS, ROUTINE W REFLEX MICROSCOPIC - Abnormal; Notable for the following components:  APPearance HAZY (*)    Hgb urine dipstick SMALL (*)    Ketones, ur 5 (*)    Protein, ur 30 (*)    All other components within normal limits  TROPONIN I (HIGH SENSITIVITY)    EKG EKG Interpretation  Date/Time:  Monday July 25 2021 08:12:01 EDT Ventricular Rate:  69 PR Interval:  150 QRS Duration: 80 QT Interval:  429 QTC Calculation: 460 R Axis:   21 Text Interpretation: Sinus rhythm Abnormal R-wave progression, early transition Borderline T abnormalities, anterior leads No significant change since last tracing Confirmed by Gwyneth Sprout ([id number]) on 07/25/2021 8:19:59  AM  Radiology CT Head Wo Contrast  Result Date: 07/25/2021 CLINICAL DATA:  Headache, intracranial hemorrhage suspected. Headache after fall, concern for delayed bleed. Decreased mobility, bilateral lower extremity weakness, constipation. EXAM: CT HEAD WITHOUT CONTRAST TECHNIQUE: Contiguous axial images were obtained from the base of the skull through the vertex without intravenous contrast. COMPARISON:  Head CT 07/16/2021. FINDINGS: Brain: Generalized cerebral atrophy. Mild patchy and ill-defined hypoattenuation within the cerebral white matter, nonspecific but compatible with chronic small vessel ischemic disease. There is no acute intracranial hemorrhage. No demarcated cortical infarct. No extra-axial fluid collection. No evidence of an intracranial mass. No midline shift. Vascular: No hyperdense vessel.  Atherosclerotic calcifications. Skull: Normal. Negative for fracture or focal lesion. Sinuses/Orbits: Visualized orbits show no acute finding. Tiny mucous retention cyst within a posterior right ethmoid air cell. Other: Small right mastoid effusion. IMPRESSION: No evidence of acute intracranial abnormality. Generalized cerebral atrophy. Mild chronic small vessel ischemic changes within the cerebral white matter. Tiny mucous retention cyst within the posterior right ethmoid air cells. Small right mastoid effusion. Electronically Signed   By: Jackey Loge D.O.   On: 07/25/2021 09:16    Procedures Procedures   Medications Ordered in ED Medications  metoCLOPramide (REGLAN) injection 10 mg (10 mg Intravenous Given 07/25/21 0821)  lactated ringers bolus 1,000 mL (0 mLs Intravenous Stopped 07/25/21 1034)  acetaminophen (TYLENOL) tablet 1,000 mg (1,000 mg Oral Given 07/25/21 0821)  sodium phosphate (FLEET) 7-19 GM/118ML enema 1 enema (1 enema Rectal Given 07/25/21 1034)    ED Course  I have reviewed the triage vital signs and the nursing notes.  Pertinent labs & imaging results that were available  during my care of the patient were reviewed by me and considered in my medical decision making (see chart for details).    MDM Rules/Calculators/A&P                           Elderly patient presenting today with complaint of her legs feeling heavy and the top of her feet being numb.  Also complaining of fecal impaction.  Patient has been having numerous issues since a fall 9 days ago where she hit her head.  There was a concern for possible concussion as patient's initial head CT was negative and she does not take any anticoagulation.  She has been having episodes of hypertension, ongoing headache and just not been herself.  Her complaint does not follow a dermatome and the numbness she describes is only on the dorsum of both feet but does not involve any toes.  Her sensation is otherwise intact.  She has good strength and coordination.  She denies any infectious symptoms.  Urine with small amount of ketones but otherwise no acute findings, CBC with mild leukocytosis of 11 and EKG without acute findings. we will get head CT to ensure no  delayed head bleed given ongoing headache and symptoms.  Patient has still had poor oral intake and will give IV fluids.  Will need to disimpact.  However seems that patient is not safe staying at her home alone.  If she does not meet admission criteria alternative measures may be needed to ensure her safety  12:29 PM Head CT was negative.  Troponin unchanged and EKG unchanged.  Fecal impaction was cleared with digital disimpaction.  Fleets enema was given.  Patient is tolerating p.o.'s.  Will need to determine a safe discharge plan.  12:29 PM Plan was in place that patient will now have 24-hour care.  A person who checks on the patient regularly is willing to come pick her up.  Then the full home care will start tomorrow.  Patient is tolerating p.o.'s.  She has had no vomiting.  Vital signs have remained reassuring and feel that she is stable and now safe for  discharge.  MDM   Amount and/or Complexity of Data Reviewed Clinical lab tests: ordered and reviewed Tests in the radiology section of CPT: ordered and reviewed Tests in the medicine section of CPT: ordered and reviewed Independent visualization of images, tracings, or specimens: yes     Final Clinical Impression(s) / ED Diagnoses Final diagnoses:  Constipation, unspecified constipation type  Weakness    Rx / DC Orders ED Discharge Orders     None        Gwyneth Sprout, MD 07/25/21 1229

## 2021-07-25 NOTE — Progress Notes (Signed)
CSW went into patients room to inform her [facility] is trying to reach her. CSW gave patient the number to call [phone], option 1) Zettie Pho from [facility] was already familiar with patient. Patient stated she wanted 8 hours a day 7 days a week care. Patient later changed her mind to 24 hour care for 7 days a week. Dannielle Huh stated that nurse Judeth Cornfield will come out to her home this afternoon and complete her intake. [facility] will begin full services tomorrow morning at 9 AM for her 24 hour care, 7 days a week. Patient was agreeable to plan.

## 2021-07-26 ENCOUNTER — Telehealth: Payer: Medicare HMO

## 2021-07-26 ENCOUNTER — Ambulatory Visit: Payer: Self-pay

## 2021-07-26 ENCOUNTER — Telehealth: Payer: Self-pay

## 2021-07-26 DIAGNOSIS — E782 Mixed hyperlipidemia: Secondary | ICD-10-CM

## 2021-07-26 DIAGNOSIS — K219 Gastro-esophageal reflux disease without esophagitis: Secondary | ICD-10-CM

## 2021-07-26 DIAGNOSIS — F17200 Nicotine dependence, unspecified, uncomplicated: Secondary | ICD-10-CM

## 2021-07-26 DIAGNOSIS — R2689 Other abnormalities of gait and mobility: Secondary | ICD-10-CM

## 2021-07-26 DIAGNOSIS — R410 Disorientation, unspecified: Secondary | ICD-10-CM

## 2021-07-26 DIAGNOSIS — R296 Repeated falls: Secondary | ICD-10-CM

## 2021-07-26 DIAGNOSIS — F325 Major depressive disorder, single episode, in full remission: Secondary | ICD-10-CM

## 2021-07-26 DIAGNOSIS — G250 Essential tremor: Secondary | ICD-10-CM

## 2021-07-26 NOTE — Telephone Encounter (Signed)
I called pt. And she seemed confused. She stated that she was at home laying in the bed and that she has a home health nurse there with her right now. She also stated she had someone going to get her some drinks with electrolytes. I asked her how she was doing and if she needed to f/u here with you three times and she didn't answer me. I did give her your recommendations then she hung the phone up.

## 2021-07-26 NOTE — Telephone Encounter (Signed)
  Care Management   Follow Up Note   07/26/2021 Name: Lindsey Frank MRN: [id number] DOB: 1946/08/23   Referred by: [name], MD Reason for referral : Chronic Care Management (Unsuccessful call)   An unsuccessful telephone outreach was attempted today. The patient was referred to the case management team for assistance with care management and care coordination. SW left a HIPAA compliant voice message for both the patient and her friend Marisue Tereasa Yilmaz requesting a return call.  Follow Up Plan: The care management team will reach out to the patient again over the next 14 days.   Bevelyn Ngo, BSW, CDP Social Worker, Certified Dementia Practitioner [facility] / [facility] Care Management [phone]

## 2021-07-26 NOTE — Chronic Care Management (AMB) (Signed)
Chronic Care Management   CCM RN Visit Note  07/26/2021 Name: Lindsey Frank MRN: [id number] DOB: January 19, 1946  Subjective: Lindsey Frank is a 75 y.o. year old female who is a primary care patient of Rita Ohara, MD. The care management team was consulted for assistance with disease management and care coordination needs.    Engaged with patient by telephone for initial visit in response to provider referral for case management and/or care coordination services.   Consent to Services:  The patient was given the following information about Chronic Care Management services today, agreed to services, and gave verbal consent: 1. CCM service includes personalized support from designated clinical staff supervised by the primary care provider, including individualized plan of care and coordination with other care providers 2. 24/7 contact phone numbers for assistance for urgent and routine care needs. 3. Service will only be billed when office clinical staff spend 20 minutes or more in a month to coordinate care. 4. Only one practitioner may furnish and bill the service in a calendar month. 5.The patient may stop CCM services at any time (effective at the end of the month) by phone call to the office staff. 6. The patient will be responsible for cost sharing (co-pay) of up to 20% of the service fee (after annual deductible is met). Patient agreed to services and consent obtained.  Patient agreed to services and verbal consent obtained.   Assessment: Review of patient past medical history, allergies, medications, health status, including review of consultants reports, laboratory and other test data, was performed as part of comprehensive evaluation and provision of chronic care management services.   SDOH (Social Determinants of Health) assessments and interventions performed:    CCM Care Plan  Allergies  Allergen Reactions   Trazodone And Nefazodone Nausea Only   Mysoline [Primidone] Nausea  Only    Nausea and achey    Outpatient Encounter Medications as of 07/26/2021  Medication Sig Note   acetaminophen (TYLENOL) 500 MG tablet Take 1,000 mg by mouth every 6 (six) hours as needed for moderate pain or headache. (Patient not taking: Reported on 07/18/2021)    Calcium Carbonate-Vitamin D 600-200 MG-UNIT TABS Take 600 mg by mouth 2 (two) times daily. (Patient not taking: Reported on 07/18/2021)    diphenhydramine-acetaminophen (TYLENOL PM) 25-500 MG TABS tablet Take 2 tablets by mouth at bedtime as needed (sleep).    naproxen sodium (ALEVE) 220 MG tablet Take 440 mg by mouth as needed. 07/18/2021: Last dose 9am this morning.    omeprazole (PRILOSEC) 20 MG capsule Take 20 mg by mouth daily.    ondansetron (ZOFRAN ODT) 4 MG disintegrating tablet Take 1 tablet (4 mg total) by mouth every 8 (eight) hours as needed for nausea or vomiting.    rosuvastatin (CRESTOR) 40 MG tablet Take 1 tablet (40 mg total) by mouth daily. (Patient taking differently: Take 40 mg by mouth at bedtime.)    sertraline (ZOLOFT) 50 MG tablet Take 1 tablet (50 mg total) by mouth daily.    vitamin B-12 (CYANOCOBALAMIN) 1000 MCG tablet Take 1 tablet (1,000 mcg total) by mouth daily.    No facility-administered encounter medications on file as of 07/26/2021.    Patient Active Problem List   Diagnosis Date Noted   Impaired fasting glucose 07/04/2016   Depression, major, in remission ([place]) 11/17/2013   GERD (gastroesophageal reflux disease) 05/14/2013   Tobacco use disorder 04/24/2012   Mixed hyperlipidemia 10/26/2011   Pure hyperglyceridemia 03/30/2011   Depressive disorder, not elsewhere  classified 03/30/2011   Benign essential tremor 02/27/2011   Pure hypercholesterolemia 02/27/2011   Migraine     Conditions to be addressed/monitored: Benign essential tremor, Depressive disorder, Mixed Hyperlipidemia, Dizziness, Vertigo, Confusion, Balance disorder, tobacco use disorder, history of falls, GERD     Care Plan :  Assist with Chronic Care Management and Care Coordination needs  Updates made by Lynne Logan, RN since 07/26/2021 12:00 AM  Completed 07/26/2021   Problem: Assist with Chronic Care Management and Care Coordination needs Resolved 07/26/2021  Priority: High     Long-Range Goal: Assist with Chronic Care Management and Care Coordination needs Completed 07/26/2021  Start Date: 07/21/2021  Expected End Date: 09/14/2021  Recent Progress: On track  Priority: High  Note:   Current Barriers:  Chronic Disease Management support, education, chronic care management and care coordination needs related to Benign essential tremor, Depression, Pure Hyperlipidemia, Dizziness, Vertigo with RNCM, SW and Pharmacy Care Management and Care coordination needs Case Manager Clinical Goal(s):  Patient will work with the CCM team to address needs related to chronic care management and care coordination needs related to Benign essential tremor, Depression, Pure Hyperlipidemia, Dizziness, Vertigo with RNCM, SW and Pharmacy Care Management and Care Coordination needs Interventions:  Collaborated with BSW to initiate plan of care to address needs related to chronic care management and care coordination needs related to Benign essential tremor, Depression, Pure Hyperlipidemia, Dizziness, Vertigo with RNCM, SW and Pharmacy Care Management and Care Coordination needs Collaboration with Glendale Chard, MD regarding development and update of comprehensive plan of care as evidenced by provider attestation and co-signature Inter-disciplinary care team collaboration (see longitudinal plan of care) Patient Goals/Self-Care Activities patient will:   - Patient will work with the CCM team to address chronic care management and care coordination needs and will continue to work with the clinical team to address health care and disease management related needs.    Follow Up Plan: The care management team will reach out to the patient  again over the next 30-45 days.       Care Plan : Wellness (Adult)  Updates made by Lynne Logan, RN since 07/26/2021 12:00 AM     Problem: Medication Adherence (Wellness)   Priority: High     Long-Range Goal: Medication Adherence Maintained   Start Date: 07/26/2021  Expected End Date: 07/26/2022  This Visit's Progress: Not on track  Priority: High  Note:   Current Barriers:  Ineffective Self Health Maintenance in a patient with Benign essential tremor, Depressive disorder, Mixed Hyperlipidemia, Dizziness, Vertigo, Confusion, Balance disorder, tobacco use disorder, history of falls, GERD     Unable to self administer medications as prescribed Cognitive Deficits Bilateral hand tremors with poor dexterity Clinical Goal(s):  Collaboration with Rita Ohara, MD regarding development and update of comprehensive plan of care as evidenced by provider attestation and co-signature Inter-disciplinary care team collaboration (see longitudinal plan of care) patient will work with care management team to address care coordination and chronic disease management needs related to Disease Management Educational Needs Care Coordination Medication Management and Education Medication Reconciliation Psychosocial Support Caregiver Stress support   Interventions:  07/26/21 completed successful outbound call with friend Norton Pastel  Evaluation of current treatment plan related to  Medication Adherence , self-management and patient's adherence to plan as established by provider. Collaboration with Rita Ohara, MD regarding development and update of comprehensive plan of care as evidenced by provider attestation       and co-signature Inter-disciplinary care team collaboration (  see longitudinal plan of care) Reviewed all medications to determine if caregiver knows why the medications are given and if taken as prescribed.  Assessed for medication adherence addressing barriers to medication  adherence.  Encouraged the use of medication reminder pill box and reminders such as clock or cell phone alarm, color coding, pillboxes for am/pm and days of the week, pharmacy refill reminder, auto-refill system or mail-order option.  Determined patient currently receives her medications via [facility] in a bubble pack, [name] recently learned Ms. Mcanelly is having difficulty breaking pills from this packing Discussed having [name] add pills to pill box and set alarms on patient's phone for am and pm administration Discussed plans with patient for ongoing care management follow up and provided patient with direct contact information for care management team Self Care Activities:  Performs ADL's independently Performs IADL's independently Patient Goals: - caregiver will fill pill box and oversee when supply is getting low for timely refills - caregiver will set an alarm on patient's phone as a reminder for when to take medications  Follow Up Plan: Telephone follow up appointment with care management team member scheduled for: 08/02/21    Care Plan : Confusion  Updates made by Lynne Logan, RN since 07/26/2021 12:00 AM     Problem: Long-Term Care Planning   Priority: High     Long-Range Goal: Effective Long-Term Care Planning   Start Date: 07/26/2021  Expected End Date: 07/26/2022  This Visit's Progress: On track  Priority: High  Note:   Current Barriers:  Ineffective Self Health Maintenance in a patient with Benign essential tremor, Depressive disorder, Mixed Hyperlipidemia, Dizziness, Vertigo, Confusion, Balance disorder, tobacco use disorder, history of falls, GERD    Unable to self administer medications as prescribed Cognitive deficits  Clinical Goal(s):  Collaboration with Rita Ohara, MD regarding development and update of comprehensive plan of care as evidenced by provider attestation and co-signature Inter-disciplinary care team collaboration (see longitudinal  plan of care) patient will work with care management team to address care coordination and chronic disease management needs related to Disease Management Educational Needs Care Coordination Medication Management and Education Medication Reconciliation Psychosocial Support   Interventions:  07/26/21 completed successful outbound call with friend Norton Pastel  Evaluation of current treatment plan related to  Confusion , self-management and patient's adherence to plan as established by provider. Collaboration with Rita Ohara, MD regarding development and update of comprehensive plan of care as evidenced by provider attestation       and co-signature Inter-disciplinary care team collaboration (see longitudinal plan of care) Discussed home safety concerns due to cognitive changes and patient lives alone Determined patient is having difficulty with memory with such things as forgetting to take her medications, and possibility forgetting to eat  Determined patient is established with Dr. Rexene Alberts; discussed and reviewed the recent brain CT w/o contrast showed the following noted:   IMPRESSION: No evidence of acute intracranial abnormality.   Generalized cerebral atrophy. Mild chronic small vessel ischemic changes within the cerebral white matter.   Tiny mucous retention cyst within the posterior right ethmoid air cells.   Small right mastoid effusion. Reviewed scheduled/upcoming provider appointments including: next Neuro follow up appointment scheduled for 08/24/21 _0 :45 AM Reviewed and discussed other reported neurological symptoms including "numbness and tingling" in her feet Discussed [name] will try to contact Dr. Rexene Alberts to ask for an earlier appointment if possible, she will accompanying Ms. Hupfer during her appointment Instructed [name] to write down her questions  and concerns to review with Dr. Rexene Alberts at next visit in order to discuss all concerns  Determined patient started  services with [employer] on 07/25/21, she will have caregiver assistance M-F 9-5 and possibly weekend visits  Advised caregiver regarding recent home health referral for SNV/PT, advised the referral is pending Educated on the benefits of ST to evaluate speech/swallowing and work with patient regarding cognitive decline  Collaboration via secure message with PCP Dr. Tomi Bamberger regarding ST orders, requested adding to [place] referral  Discussed plans with patient for ongoing care management follow up and provided patient with direct contact information for care management team Self Care Activities:  Performs ADL's independently Performs IADL's independently Patient Goals: - continue to use [facility] for caregiver support - contact Dr. Rexene Alberts to request earlier follow up appointment - work with [place] for evaluation of speech/swallowing and cognitive decline  Follow Up Plan: Telephone follow up appointment with care management team member scheduled for: 08/02/21    Problem: Malnutrition (Undernutrition)   Priority: High     Long-Range Goal: Malnutrition (Undernutrition) Prevented or Managed   Start Date: 07/26/2021  Expected End Date: 07/26/2022  This Visit's Progress: Not on track  Priority: High  Note:   Current Barriers:  Ineffective Self Health Maintenance in a patient with Benign essential tremor, Depressive disorder, Mixed Hyperlipidemia, Dizziness, Vertigo, Confusion, Balance disorder, tobacco use disorder, history of falls, GERD    Unable to self administer medications as prescribed Cognitive Decline  Clinical Goal(s):  Collaboration with Rita Ohara, MD regarding development and update of comprehensive plan of care as evidenced by provider attestation and co-signature Inter-disciplinary care team collaboration (see longitudinal plan of care) patient will work with care management team to address care coordination and chronic disease management needs related to  Disease Management Educational Needs Care Coordination Medication Management and Education Medication Reconciliation Psychosocial Support Dementia and Caregiver Support   Interventions:  07/26/21 completed successful outbound call with friend Norton Pastel  Evaluation of current treatment plan related to  Benign essential tremor, Depressive disorder, Mixed Hyperlipidemia, Dizziness, Vertigo, Confusion, Balance disorder, tobacco use disorder, history of falls, GERD    , self-management and patient's adherence to plan as established by provider. Collaboration with Rita Ohara, MD regarding development and update of comprehensive plan of care as evidenced by provider attestation       and co-signature Inter-disciplinary care team collaboration (see longitudinal plan of care) Determined caregiver [name] is concerned that Ms. Trimmer is malnourished, she eats 1 meal a day on most days, she does eat the groceries brought into the house in a timely manner so they spoil Assessed for nutritional barriers including swallowing difficulties, self care deficits contributing to patient not wanting to prepare meals and or the possibility that patient forgets to eat  Educated [name] on the benefits of having [place] evaluate patient for swallowing difficulties Collaborated with embedded [place] regarding assistance with Meal on [employer] with PCP, Dr. Tomi Bamberger regarding request to add ST to [place] referral to evaluate swallowing  Discussed plans with patient for ongoing care management follow up and provided patient with direct contact information for care management team Self Care Activities:  Performs ADL's independently Performs IADL's independently Patient Goals: - work with embedded BSW to assist with Meals on Wheels - work with [place] for evalution of swallowing   Follow Up Plan: Telephone follow up appointment with care management team member scheduled  for: 08/02/21  Care Plan : Fall Risk (Adult)  Updates made by Lynne Logan, RN since 07/26/2021 12:00 AM     Problem: Fall Risk   Priority: High     Long-Range Goal: Absence of Fall and Fall-Related Injury   Start Date: 07/26/2021  Expected End Date: 07/26/2022  This Visit's Progress: On track  Priority: High  Note:   Current Barriers:  Knowledge Deficits related to fall precautions in patient with Benign essential tremor, Depressive disorder, Mixed Hyperlipidemia, Dizziness, Vertigo, Confusion, Balance disorder, tobacco use disorder, history of falls, GERD     Decreased adherence to prescribed treatment for fall prevention Unable to self administer medications as prescribed Clinical Goal(s):  patient will verbalize using fall risk reduction strategies discussed patient will not experience additional falls patient will verbalize understanding of plan for participation in PT/OT and adhere to the prescribed HEP Patient will continue to follow up with Neurology for ongoing evaluation/treatment of impaired gait/balance  Interventions:  07/26/21 completed successful outbound call with caregiver Norton Pastel  Collaboration with Rita Ohara, MD regarding development and update of comprehensive plan of care as evidenced by provider attestation and co-signature Inter-disciplinary care team collaboration (see longitudinal plan of care) Evaluation of current treatment plan related to Impaired gait and mobility and patient's adherence to plan as established by provider. Assessed for falls since last reported fall in early October Assessed patients/caregiver knowledge of fall risk prevention Collaborated with embedded BSW regarding patient information needed for fall alert systems Assessed for DME needs, patient is currently using a walker without wheels Instructed caregiver [name] to report persistent balance issues with Neurologist Reviewed and discussed new referral for in home  [facility] and PT/OT evaluation and treatment sent by PCP, Dr. Tomi Bamberger, advised this referral is in progress and she should receive a call from [facility] agency once authorized  Reviewed scheduled/upcoming provider appointments including: next Neuro follow up with Dr. Rexene Alberts with [facility] scheduled for 08/24/21 _0 :80 AM Discussed plans with patient for ongoing care management follow up and provided patient with direct contact information for care management team Self-Care Deficits:  Unable to self administer medications as prescribed Patient Goals:  - Utilize walker (assistive device) appropriately with all ambulation - De-clutter walkways - Change positions slowly - Wear secure fitting shoes at all times with ambulation - Utilize home lighting for dim lit areas - Demonstrate self and pet awareness at all times  Follow Up Plan: Telephone follow up appointment with care management team member scheduled for: 08/02/21    Plan:Telephone follow up appointment with care management team member scheduled for:  08/02/21  Barb Merino, RN, BSN, CCM Care Management Coordinator Embedded RN Care Manager for [facility] Phone: [phone]

## 2021-07-26 NOTE — Telephone Encounter (Signed)
Pt. Showed up on our pt. Ping report as recently going to the ED. Looks like she went there for constipation and extremity weakness. I wasn't sure if you wanted her to f/u here or not.

## 2021-07-26 NOTE — Telephone Encounter (Signed)
She should be getting set up with all kinds of services, arranged by CCM folks and ER (caregivers, SW, etc).  You can call her to see how she is feeling and see if she needs f/u, but if she is doing better, no appt needed.  You can remind her to drink plenty of water and eat a high fiber diet to help prevent constipation.

## 2021-07-26 NOTE — Patient Instructions (Signed)
Visit Information   PATIENT GOALS:   Goals Addressed      Absence of Falls and Fall-related injury   On track    Timeframe:  Long-Range Goal Priority:  High Start Date:  07/26/21                           Expected End Date: 07/26/22   Follow up date: 08/02/21   Patient Goals:  - Utilize walker (assistive device) appropriately with all ambulation - De-clutter walkways - Change positions slowly - Wear secure fitting shoes at all times with ambulation - Utilize home lighting for dim lit areas - Demonstrate self and pet awareness at all times                        COMPLETED: Assist with Chronic Care Management and Care Coordination needs       Timeframe:  Short-Term Goal Priority:  High Start Date:  07/21/21                          Expected End Date: 09/14/21       Initial RN CM Outreach: 07/26/21  Patient Goals/Self-Care Activities patient will:   - Patient will work with the CCM team to address chronic care management and care coordination needs and will continue to work with the clinical team to address health care and disease management related needs.    Follow Up Plan: The care management team will reach out to the patient again over the next 30-45 days.                       Effective Long Term Care Planning   On track    Timeframe:  Long-Range Goal Priority:  High Start Date:  07/26/21                           Expected End Date:  07/26/22  Follow up date: 08/02/21         Patient Goals: - continue to use [employer] for caregiver support - contact Dr. Rexene Alberts to request earlier follow up appointment - work with [place] for evaluation of speech/swallowing and cognitive decline                  Malnutrition Prevented or Managed   Not on track    Timeframe:  Long-Range Goal Priority:  High Start Date:   07/26/21                         Expected End Date: 07/26/22  Follow up date: 08/02/21      Patient Goals: - work with embedded BSW to assist with  Meals on Wheels - work with [place] for evalution of swallowing                         Medication Adherence maintained   Not on track    Timeframe:  Long-Range Goal Priority:  High Start Date:  07/26/21                           Expected End Date:  07/26/22   Follow up date: 08/02/21       Patient Goals: - caregiver will fill pill  box and oversee when supply is getting low for timely refills - caregiver will set an alarm on patient's phone as a reminder for when to take medications                      Consent to CCM Services: Ms. Solan was given information about Chronic Care Management services including:  CCM service includes personalized support from designated clinical staff supervised by her physician, including individualized plan of care and coordination with other care providers 24/7 contact phone numbers for assistance for urgent and routine care needs. Service will only be billed when office clinical staff spend 20 minutes or more in a month to coordinate care. Only one practitioner may furnish and bill the service in a calendar month. The patient may stop CCM services at any time (effective at the end of the month) by phone call to the office staff. The patient will be responsible for cost sharing (co-pay) of up to 20% of the service fee (after annual deductible is met).  Patient agreed to services and verbal consent obtained.   The patient verbalized understanding of instructions, educational materials, and care plan provided today and declined offer to receive copy of patient instructions, educational materials, and care plan.   Telephone follow up appointment with care management team member scheduled for: 08/02/21  Barb Merino, RN, BSN, CCM Care Management Coordinator Embedded RN Care Manager for [place] Direct Phone: [phone]    CLINICAL CARE PLAN: Patient Care Plan: Assist with Chronic Care Management and Care Coordination  needs  Completed 07/26/2021   Problem Identified: Assist with Chronic Care Management and Care Coordination needs Resolved 07/26/2021  Priority: High     Long-Range Goal: Assist with Chronic Care Management and Care Coordination needs Completed 07/26/2021  Start Date: 07/21/2021  Expected End Date: 09/14/2021  Recent Progress: On track  Priority: High  Note:   Current Barriers:  Chronic Disease Management support, education, chronic care management and care coordination needs related to Benign essential tremor, Depression, Pure Hyperlipidemia, Dizziness, Vertigo with RNCM, SW and Pharmacy Care Management and Care coordination needs Case Manager Clinical Goal(s):  Patient will work with the CCM team to address needs related to chronic care management and care coordination needs related to Benign essential tremor, Depression, Pure Hyperlipidemia, Dizziness, Vertigo with RNCM, SW and Pharmacy Care Management and Care Coordination needs Interventions:  Collaborated with BSW to initiate plan of care to address needs related to chronic care management and care coordination needs related to Benign essential tremor, Depression, Pure Hyperlipidemia, Dizziness, Vertigo with RNCM, SW and Pharmacy Care Management and Care Coordination needs Collaboration with Glendale Chard, MD regarding development and update of comprehensive plan of care as evidenced by provider attestation and co-signature Inter-disciplinary care team collaboration (see longitudinal plan of care) Patient Goals/Self-Care Activities patient will:   - Patient will work with the CCM team to address chronic care management and care coordination needs and will continue to work with the clinical team to address health care and disease management related needs.    Follow Up Plan: The care management team will reach out to the patient again over the next 30-45 days.       Patient Care Plan: Social Work [facility] Care Plan     Problem  Identified: Barriers to Treatment      Goal: Barriers to Treatment Identified and Managed   Start Date: 07/21/2021  Priority: High  Note:   Current Barriers:  Chronic disease  management support and education needs related to  Benign Essential Tremor, Depression, Pure Hyperlipidemia, Dizziness, and Vertigo   Memory Deficits - patient poor historian, unable to complete the call  Social Worker Clinical Goal(s):  patient will work with SW to identify and address any acute and/or chronic care coordination needs related to the self health management of  Benign Essential Tremor, Depression, [place] Hyperlipidemia, Dizziness, and Vertigo   Patient will engage with RN Care Manager to develop an individualized plan of care to address disease management needs  SW Interventions:  Inter-disciplinary care team collaboration (see longitudinal plan of care) Collaboration with Rita Ohara, MD regarding development and update of comprehensive plan of care as evidenced by provider attestation and co-signature Collaboration with Raina Mina, ED social worker who indicates patient is currently in the ED Discussed provider is concerned if it is safe to discharge the patient home or if there is an alternative options [name] indicates she attempted to contact the patients son but was unsuccessful Advised SW to contact patients local friend Norton Pastel for assistance with a safe discharge plan Discussed this Probation officer spoke with [name] Friday 10/7 to discuss patients care needs in the home - Mrs. Nolon Rod stated she would work with the patient and her son to explore private duty options through [employer]  Patient Goals/Self-Care Activities patient will:  -Work with ED provider to identify a safe discharge plan -Increase fluid intake  -  Attend neurology appointment to assess dizziness and memory loss -Engage with [facility] with embedded RN Care Manager for future initial  clinical assessment  Follow Up Plan:  SW will continue to follow     Patient Care Plan: Wellness (Adult)     Problem Identified: Medication Adherence (Wellness)   Priority: High     Long-Range Goal: Medication Adherence Maintained   Start Date: 07/26/2021  Expected End Date: 07/26/2022  This Visit's Progress: Not on track  Priority: High  Note:   Current Barriers:  Ineffective Self Health Maintenance in a patient with Benign essential tremor, Depressive disorder, Mixed Hyperlipidemia, Dizziness, Vertigo, Confusion, Balance disorder, tobacco use disorder, history of falls, GERD     Unable to self administer medications as prescribed Cognitive Deficits Bilateral hand tremors with poor dexterity Clinical Goal(s):  Collaboration with Rita Ohara, MD regarding development and update of comprehensive plan of care as evidenced by provider attestation and co-signature Inter-disciplinary care team collaboration (see longitudinal plan of care) patient will work with care management team to address care coordination and chronic disease management needs related to Disease Management Educational Needs Care Coordination Medication Management and Education Medication Reconciliation Psychosocial Support Caregiver Stress support   Interventions:  07/26/21 completed successful outbound call with friend Norton Pastel  Evaluation of current treatment plan related to  Medication Adherence , self-management and patient's adherence to plan as established by provider. Collaboration with Rita Ohara, MD regarding development and update of comprehensive plan of care as evidenced by provider attestation       and co-signature Inter-disciplinary care team collaboration (see longitudinal plan of care) Reviewed all medications to determine if caregiver knows why the medications are given and if taken as prescribed.  Assessed for medication adherence addressing barriers to medication adherence.   Encouraged the use of medication reminder pill box and reminders such as clock or cell phone alarm, color coding, pillboxes for am/pm and days of the week, pharmacy refill reminder, auto-refill system or mail-order option.  Determined patient currently receives her medications  via [facility] in a bubble pack, [name] recently learned Ms. Hinderer is having difficulty breaking pills from this packing Discussed having [name] add pills to pill box and set alarms on patient's phone for am and pm administration Discussed plans with patient for ongoing care management follow up and provided patient with direct contact information for care management team Self Care Activities:  Performs ADL's independently Performs IADL's independently Patient Goals: - caregiver will fill pill box and oversee when supply is getting low for timely refills - caregiver will set an alarm on patient's phone as a reminder for when to take medications  Follow Up Plan: Telephone follow up appointment with care management team member scheduled for: 08/02/21    Patient Care Plan: Confusion     Problem Identified: Long-Term Care Planning   Priority: High     Long-Range Goal: Effective Long-Term Care Planning   Start Date: 07/26/2021  Expected End Date: 07/26/2022  This Visit's Progress: On track  Priority: High  Note:   Current Barriers:  Ineffective Self Health Maintenance in a patient with Benign essential tremor, Depressive disorder, Mixed Hyperlipidemia, Dizziness, Vertigo, Confusion, Balance disorder, tobacco use disorder, history of falls, GERD    Unable to self administer medications as prescribed Cognitive deficits  Clinical Goal(s):  Collaboration with Rita Ohara, MD regarding development and update of comprehensive plan of care as evidenced by provider attestation and co-signature Inter-disciplinary care team collaboration (see longitudinal plan of care) patient will work with care management team  to address care coordination and chronic disease management needs related to Disease Management Educational Needs Care Coordination Medication Management and Education Medication Reconciliation Psychosocial Support   Interventions:  07/26/21 completed successful outbound call with friend Norton Pastel  Evaluation of current treatment plan related to  Confusion , self-management and patient's adherence to plan as established by provider. Collaboration with Rita Ohara, MD regarding development and update of comprehensive plan of care as evidenced by provider attestation       and co-signature Inter-disciplinary care team collaboration (see longitudinal plan of care) Discussed home safety concerns due to cognitive changes and patient lives alone Determined patient is having difficulty with memory with such things as forgetting to take her medications, and possibility forgetting to eat  Determined patient is established with Dr. Rexene Alberts; discussed and reviewed the recent brain CT w/o contrast showed the following noted:   IMPRESSION: No evidence of acute intracranial abnormality.   Generalized cerebral atrophy. Mild chronic small vessel ischemic changes within the cerebral white matter.   Tiny mucous retention cyst within the posterior right ethmoid air cells.   Small right mastoid effusion. Reviewed scheduled/upcoming provider appointments including: next Neuro follow up appointment scheduled for 08/24/21 @10 :45 AM Reviewed and discussed other reported neurological symptoms including "numbness and tingling" in her feet Discussed [name] will try to contact Dr. Rexene Alberts to ask for an earlier appointment if possible, she will accompanying Ms. Eversley during her appointment Instructed [name] to write down her questions and concerns to review with Dr. Rexene Alberts at next visit in order to discuss all concerns  Determined patient started services with [employer] on 07/25/21, she will have  caregiver assistance M-F 9-5 and possibly weekend visits  Advised caregiver regarding recent home health referral for SNV/PT, advised the referral is pending Educated on the benefits of ST to evaluate speech/swallowing and work with patient regarding cognitive decline  Collaboration via secure message with PCP Dr. Tomi Bamberger regarding ST orders, requested adding to [place] referral  Discussed plans with patient for ongoing care management follow up and provided patient with direct contact information for care management team Self Care Activities:  Performs ADL's independently Performs IADL's independently Patient Goals: - continue to use [employer] for caregiver support - contact Dr. Rexene Alberts to request earlier follow up appointment - work with [place] for evaluation of speech/swallowing and cognitive decline  Follow Up Plan: Telephone follow up appointment with care management team member scheduled for: 08/02/21     Problem Identified: Malnutrition (Undernutrition)   Priority: High     Long-Range Goal: Malnutrition (Undernutrition) Prevented or Managed   Start Date: 07/26/2021  Expected End Date: 07/26/2022  This Visit's Progress: Not on track  Priority: High  Note:   Current Barriers:  Ineffective Self Health Maintenance in a patient with Benign essential tremor, Depressive disorder, Mixed Hyperlipidemia, Dizziness, Vertigo, Confusion, Balance disorder, tobacco use disorder, history of falls, GERD    Unable to self administer medications as prescribed Cognitive Decline  Clinical Goal(s):  Collaboration with Rita Ohara, MD regarding development and update of comprehensive plan of care as evidenced by provider attestation and co-signature Inter-disciplinary care team collaboration (see longitudinal plan of care) patient will work with care management team to address care coordination and chronic disease management needs related to Disease Management Educational Needs Care  Coordination Medication Management and Education Medication Reconciliation Psychosocial Support Dementia and Caregiver Support   Interventions:  07/26/21 completed successful outbound call with friend Norton Pastel  Evaluation of current treatment plan related to  Benign essential tremor, Depressive disorder, Mixed Hyperlipidemia, Dizziness, Vertigo, Confusion, Balance disorder, tobacco use disorder, history of falls, GERD    , self-management and patient's adherence to plan as established by provider. Collaboration with Rita Ohara, MD regarding development and update of comprehensive plan of care as evidenced by provider attestation       and co-signature Inter-disciplinary care team collaboration (see longitudinal plan of care) Determined caregiver [name] is concerned that Ms. Gammage is malnourished, she eats 1 meal a day on most days, she does eat the groceries brought into the house in a timely manner so they spoil Assessed for nutritional barriers including swallowing difficulties, self care deficits contributing to patient not wanting to prepare meals and or the possibility that patient forgets to eat  Educated [name] on the benefits of having [place] evaluate patient for swallowing difficulties Collaborated with embedded [place] regarding assistance with Meal on [employer] with PCP, Dr. Tomi Bamberger regarding request to add ST to [place] referral to evaluate swallowing  Discussed plans with patient for ongoing care management follow up and provided patient with direct contact information for care management team Self Care Activities:  Performs ADL's independently Performs IADL's independently Patient Goals: - work with embedded BSW to assist with Meals on Wheels - work with [place] for evalution of swallowing   Follow Up Plan: Telephone follow up appointment with care management team member scheduled for: 08/02/21      Patient Care Plan:  Fall Risk (Adult)     Problem Identified: Fall Risk   Priority: High     Long-Range Goal: Absence of Fall and Fall-Related Injury   Start Date: 07/26/2021  Expected End Date: 07/26/2022  This Visit's Progress: On track  Priority: High  Note:   Current Barriers:  Knowledge Deficits related to fall precautions in patient with Benign essential tremor, Depressive disorder, Mixed Hyperlipidemia, Dizziness, Vertigo, Confusion, Balance disorder, tobacco use disorder, history of  falls, GERD     Decreased adherence to prescribed treatment for fall prevention Unable to self administer medications as prescribed Clinical Goal(s):  patient will verbalize using fall risk reduction strategies discussed patient will not experience additional falls patient will verbalize understanding of plan for participation in PT/OT and adhere to the prescribed HEP Patient will continue to follow up with Neurology for ongoing evaluation/treatment of impaired gait/balance  Interventions:  07/26/21 completed successful outbound call with caregiver Norton Pastel  Collaboration with Rita Ohara, MD regarding development and update of comprehensive plan of care as evidenced by provider attestation and co-signature Inter-disciplinary care team collaboration (see longitudinal plan of care) Evaluation of current treatment plan related to Impaired gait and mobility and patient's adherence to plan as established by provider. Assessed for falls since last reported fall in early October Assessed patients/caregiver knowledge of fall risk prevention Collaborated with embedded BSW regarding patient information needed for fall alert systems Assessed for DME needs, patient is currently using a walker without wheels Instructed caregiver [name] to report persistent balance issues with Neurologist Reviewed and discussed new referral for in home [facility] and PT/OT evaluation and treatment sent by PCP, Dr. Tomi Bamberger, advised this referral is  in progress and she should receive a call from [facility] agency once authorized  Reviewed scheduled/upcoming provider appointments including: next Neuro follow up with Dr. Rexene Alberts with [facility] scheduled for 08/24/21 @10 :5 AM Discussed plans with patient for ongoing care management follow up and provided patient with direct contact information for care management team Self-Care Deficits:  Unable to self administer medications as prescribed Patient Goals:  - Utilize walker (assistive device) appropriately with all ambulation - De-clutter walkways - Change positions slowly - Wear secure fitting shoes at all times with ambulation - Utilize home lighting for dim lit areas - Demonstrate self and pet awareness at all times  Follow Up Plan: Telephone follow up appointment with care management team member scheduled for: 08/02/21

## 2021-07-27 ENCOUNTER — Telehealth: Payer: Self-pay | Admitting: Neurology

## 2021-07-27 ENCOUNTER — Other Ambulatory Visit: Payer: Self-pay | Admitting: *Deleted

## 2021-07-27 DIAGNOSIS — R1310 Dysphagia, unspecified: Secondary | ICD-10-CM

## 2021-07-27 DIAGNOSIS — R296 Repeated falls: Secondary | ICD-10-CM

## 2021-07-27 DIAGNOSIS — R4189 Other symptoms and signs involving cognitive functions and awareness: Secondary | ICD-10-CM

## 2021-07-27 NOTE — Telephone Encounter (Signed)
FYI We received an urgent referral for this patient for continued falls, vertigo and confusion, which she was recently seen in the ER for. Dr. Lynelle Doctor, patients PCP referred her for an ASAP appt. I reached out to patient to get her scheduled and spoke with pt over phone-she also had a home health nurse with her and we spoke as well. I tried to get patient to come in for an appt tomorrow with Dr. Frances Furbish. Pt stated she was not in the position to pick up everything and just come to an appt tomorrow. I asked if she would be able to come to her appt scheduled in November with Dr. Frances Furbish and home health nurse stated they have it on the calendar and the agency will transport her to that appt. I advised if anything changes and she can come in sooner or if she needs anything to give [place] a call. She said well I am calling right now because I need to talk to my son to get him down here to help me. I asked if she was able to get in contact with him and she stated no I was trying to call him and then you called me. I let her know if she needed anything to give [place] a call back.

## 2021-07-27 NOTE — Telephone Encounter (Signed)
Noted thank you.  The patient was recently seen by me for the same issues reported.  Nothing further needed.

## 2021-08-02 ENCOUNTER — Telehealth: Payer: Self-pay | Admitting: Family Medicine

## 2021-08-02 ENCOUNTER — Telehealth: Payer: Medicare HMO

## 2021-08-02 ENCOUNTER — Ambulatory Visit: Payer: Self-pay

## 2021-08-02 ENCOUNTER — Telehealth: Payer: Self-pay

## 2021-08-02 DIAGNOSIS — G250 Essential tremor: Secondary | ICD-10-CM

## 2021-08-02 DIAGNOSIS — E782 Mixed hyperlipidemia: Secondary | ICD-10-CM

## 2021-08-02 DIAGNOSIS — F325 Major depressive disorder, single episode, in full remission: Secondary | ICD-10-CM

## 2021-08-02 NOTE — Chronic Care Management (AMB) (Signed)
Chronic Care Management    Social Work Note  08/02/2021 Name: Lindsey Frank MRN: [id number] DOB: 1946-09-08  Lindsey Frank is a 75 y.o. year old female who is a primary care patient of [name], MD. The CCM team was consulted to assist the patient with chronic disease management and/or care coordination needs related to:  Benign Essential Tremor, Depression, Mixed Hyperlipidemia, Dizziness, and Vertigo .   Engaged with patient by telephone for follow up visit in response to provider referral for social work chronic care management and care coordination services.   Consent to Services:  The patient was given information about Chronic Care Management services, agreed to services, and gave verbal consent prior to initiation of services.  Please see initial visit note for detailed documentation.   Patient agreed to services and consent obtained.   Assessment: Review of patient past medical history, allergies, medications, and health status, including review of relevant consultants reports was performed today as part of a comprehensive evaluation and provision of chronic care management and care coordination services.     SDOH (Social Determinants of Health) assessments and interventions performed:    Advanced Directives Status: Not addressed in this encounter.  CCM Care Plan  Allergies  Allergen Reactions   Trazodone And Nefazodone Nausea Only   Mysoline [Primidone] Nausea Only    Nausea and achey    Outpatient Encounter Medications as of 08/02/2021  Medication Sig Note   acetaminophen (TYLENOL) 500 MG tablet Take 1,000 mg by mouth every 6 (six) hours as needed for moderate pain or headache. (Patient not taking: Reported on 07/18/2021)    Calcium Carbonate-Vitamin D 600-200 MG-UNIT TABS Take 600 mg by mouth 2 (two) times daily. (Patient not taking: Reported on 07/18/2021)    diphenhydramine-acetaminophen (TYLENOL PM) 25-500 MG TABS tablet Take 2 tablets by mouth at bedtime as  needed (sleep).    naproxen sodium (ALEVE) 220 MG tablet Take 440 mg by mouth as needed. 07/18/2021: Last dose 9am this morning.    omeprazole (PRILOSEC) 20 MG capsule Take 20 mg by mouth daily.    ondansetron (ZOFRAN ODT) 4 MG disintegrating tablet Take 1 tablet (4 mg total) by mouth every 8 (eight) hours as needed for nausea or vomiting.    rosuvastatin (CRESTOR) 40 MG tablet Take 1 tablet (40 mg total) by mouth daily. (Patient taking differently: Take 40 mg by mouth at bedtime.)    sertraline (ZOLOFT) 50 MG tablet Take 1 tablet (50 mg total) by mouth daily.    vitamin B-12 (CYANOCOBALAMIN) 1000 MCG tablet Take 1 tablet (1,000 mcg total) by mouth daily.    No facility-administered encounter medications on file as of 08/02/2021.    Patient Active Problem List   Diagnosis Date Noted   Impaired fasting glucose 07/04/2016   Depression, major, in remission (HCC) 11/17/2013   GERD (gastroesophageal reflux disease) 05/14/2013   Tobacco use disorder 04/24/2012   Mixed hyperlipidemia 10/26/2011   Pure hyperglyceridemia 03/30/2011   Depressive disorder, not elsewhere classified 03/30/2011   Benign essential tremor 02/27/2011   Pure hypercholesterolemia 02/27/2011   Migraine     Conditions to be addressed/monitored:  Benign Essential Tremor, Depression, Mixed Hyperlipidemia, Dizziness, and Vertigo ; ADL IADL limitations  Care Plan : Social Work [facility] Care Plan  Updates made by Bevelyn Ngo since 08/02/2021 12:00 AM     Problem: Barriers to Treatment      Goal: Barriers to Treatment Identified and Managed   Start Date: 07/21/2021  This Visit's Progress: On  track  Priority: High  Note:   Current Barriers:  Chronic disease management support and education needs related to  Benign Essential Tremor, Depression, Pure Hyperlipidemia, Dizziness, and Vertigo   Memory Deficits - patient poor historian, unable to complete the call  Social Worker Clinical Goal(s):  patient will work with SW to  identify and address any acute and/or chronic care coordination needs related to the self health management of  Benign Essential Tremor, Depression, Pute Hyperlipidemia, Dizziness, and Vertigo   Patient will engage with RN Care Manager to develop an individualized plan of care to address disease management needs  SW Interventions:  Inter-disciplinary care team collaboration (see longitudinal plan of care) Collaboration with [name], MD regarding development and update of comprehensive plan of care as evidenced by provider attestation and co-signature Unsuccessful outbound call placed to the patients friend [place] - voice message left requesting a return call Successful outbound call placed to the patient to assess for care coordination needs - patient reports she must keep this call brief as she is expecting a call from RN Care Manager at 10:20 am to complete a telephone visit Collaboration with Delsa Sale RN Care Manager who confirms she will contact the patient at 10:20 - advised the patient SW would keep todays call brief Discussed the patient remains in her home with care from [facility]  Determined the patient would like SW assistance to move into either [facility] or [place] Advised the patient SW would collaborate with Dr. Lynelle Doctor to complete an [facility]  Collaboration with Dr. Lynelle Doctor to advise of patients desire for placement SW will follow up with the patient over the next 3 days to review desired level of care and process for each community  Patient Goals/Self-Care Activities patient will:  -Work with SW to obtain desired placement -Engage with embedded RN Care Manager to address care management needs  Follow Up Plan:  SW will continue to follow       Follow Up Plan: SW will follow up with patient by phone over the next 3 days      Bevelyn Ngo, BSW, CDP Social Worker, Certified Dementia Practitioner [facility] Family Medicine / [facility] Care Management [phone]

## 2021-08-02 NOTE — Patient Instructions (Signed)
Social Worker Visit Information  Goals we discussed today:   Goals Addressed             This Visit's Progress    Barriers to Treatment Identified and Managed       Timeframe:  Short-Term Goal Priority:  High Start Date:     10.6.22                                              Patient Goals/Self-Care Activities patient will: -Work with SW to obtain desired placement -Engage with embedded RN Care Manager to address care management needs         Patient verbalizes understanding of instructions provided today and agrees to view in [place].   Follow Up Plan: SW will follow up with patient by phone over the next 3 days   Bevelyn Ngo, BSW, CDP Social Worker, Certified Dementia Practitioner [facility] / [facility] Care Management [phone]

## 2021-08-02 NOTE — Patient Instructions (Signed)
Social Worker Visit Information  Goals we discussed today:   Goals Addressed             This Visit's Progress    Barriers to Treatment Identified and Managed   On track    Timeframe:  Short-Term Goal Priority:  High Start Date:     10.6.22                                              Patient Goals/Self-Care Activities patient will:  -Work with SW to obtain desired placement -Engage with embedded Medical illustrator to address care management needs         Materials Provided: Yes: brief verbal education regarding placement process  Patient verbalizes understanding of instructions provided today and agrees to view in MyChart.   Follow Up Plan: SW will follow up with patient by phone over the next 3 days   Bevelyn Ngo, BSW, CDP Social Worker, Certified Dementia Practitioner [facility] / [facility] Care Management [phone]

## 2021-08-02 NOTE — Telephone Encounter (Signed)
Pt called and wants you to refer her to Dr Suszanne Conners for her concussion.  Her friends told her that dr might could help.

## 2021-08-02 NOTE — Telephone Encounter (Signed)
Dr. Suszanne Conners is ENT.  He treats vertigo when related to the ear.  He does not treat concussions.  I tried to get her neurology f/u appt sooner--doesn't look like it is sooner, still in November.  She may want to call her neuro's office to see if they can see her sooner (I had put in a different referral to try and get her in sooner) if she is having ongoing neurologic issues related to her falls.

## 2021-08-02 NOTE — Chronic Care Management (AMB) (Signed)
Chronic Care Management    Social Work Note  08/02/2021 Name: Lindsey Frank MRN: [id number] DOB: 06/08/1946  Lindsey Frank is a 75 y.o. year old female who is a primary care patient of Lindsey Arrow, MD. The CCM team was consulted to assist the patient with chronic disease management and/or care coordination needs related to:  Benign Essential Tremor, Depression, Mixed Hyperlipidemia, Dizziness, Vertigo .   Engaged with Lindsey Frank by phone  for follow up visit in response to provider referral for social work chronic care management and care coordination services.   Consent to Services:  The patient was given information about Chronic Care Management services, agreed to services, and gave verbal consent prior to initiation of services.  Please see initial visit note for detailed documentation.   Patient agreed to services and consent obtained.   Assessment: Review of patient past medical history, allergies, medications, and health status, including review of relevant consultants reports was performed today as part of a comprehensive evaluation and provision of chronic care management and care coordination services.     SDOH (Social Determinants of Health) assessments and interventions performed:    Advanced Directives Status: Not addressed in this encounter.  CCM Care Plan  Allergies  Allergen Reactions   Trazodone And Nefazodone Nausea Only   Mysoline [Primidone] Nausea Only    Nausea and achey    Outpatient Encounter Medications as of 08/02/2021  Medication Sig Note   acetaminophen (TYLENOL) 500 MG tablet Take 1,000 mg by mouth every 6 (six) hours as needed for moderate pain or headache. (Patient not taking: Reported on 07/18/2021)    Calcium Carbonate-Vitamin D 600-200 MG-UNIT TABS Take 600 mg by mouth 2 (two) times daily. (Patient not taking: Reported on 07/18/2021)    diphenhydramine-acetaminophen (TYLENOL PM) 25-500 MG TABS tablet Take 2 tablets by mouth at bedtime  as needed (sleep).    naproxen sodium (ALEVE) 220 MG tablet Take 440 mg by mouth as needed. 07/18/2021: Last dose 9am this morning.    omeprazole (PRILOSEC) 20 MG capsule Take 20 mg by mouth daily.    ondansetron (ZOFRAN ODT) 4 MG disintegrating tablet Take 1 tablet (4 mg total) by mouth every 8 (eight) hours as needed for nausea or vomiting.    rosuvastatin (CRESTOR) 40 MG tablet Take 1 tablet (40 mg total) by mouth daily. (Patient taking differently: Take 40 mg by mouth at bedtime.)    sertraline (ZOLOFT) 50 MG tablet Take 1 tablet (50 mg total) by mouth daily.    vitamin B-12 (CYANOCOBALAMIN) 1000 MCG tablet Take 1 tablet (1,000 mcg total) by mouth daily.    No facility-administered encounter medications on file as of 08/02/2021.    Patient Active Problem List   Diagnosis Date Noted   Impaired fasting glucose 07/04/2016   Depression, major, in remission (HCC) 11/17/2013   GERD (gastroesophageal reflux disease) 05/14/2013   Tobacco use disorder 04/24/2012   Mixed hyperlipidemia 10/26/2011   Pure hyperglyceridemia 03/30/2011   Depressive disorder, not elsewhere classified 03/30/2011   Benign essential tremor 02/27/2011   Pure hypercholesterolemia 02/27/2011   Migraine     Conditions to be addressed/monitored:  Benign Essential Tremor, Depression, Mixed Hyperlipidemia, Dizziness, Vertigo   Care Plan : Social Work [facility] Care Plan  Updates made by Bevelyn Ngo since 08/02/2021 12:00 AM     Problem: Barriers to Treatment      Goal: Barriers to Treatment Identified and Managed   Start Date: 07/21/2021  Recent Progress: On track  Priority: High  Note:   Current Barriers:  Chronic disease management support and education needs related to  Benign Essential Tremor, Depression, Pure Hyperlipidemia, Dizziness, and Vertigo   Memory Deficits - patient poor historian, unable to complete the call  Social Worker Clinical Goal(s):  patient will work with SW to identify and address any  acute and/or chronic care coordination needs related to the self health management of  Benign Essential Tremor, Depression, Pute Hyperlipidemia, Dizziness, and Vertigo   Patient will engage with RN Care Manager to develop an individualized plan of care to address disease management needs  SW Interventions:  Inter-disciplinary care team collaboration (see longitudinal plan of care) Collaboration with Lindsey Arrow, MD regarding development and update of comprehensive plan of care as evidenced by provider attestation and co-signature Inbound call received from Lindsey Frank returning SW call Discussed Lindsey Frank has received a call from [facility] informing her caregiver are reporting the patient is locking herself in her bathroom and "chain smoking" to the point caregivers are requesting to be reassigned to a different patient Determined Lindsey Frank plans to contact the patient advising she needs to smoke outside the home due to caregiver concerns with smoke in the home SW advised Lindsey Frank the patient requested this SW assist with placement into [facility] or [facility] Reviewed both of these communities offer more than 1 level of care  Attempted to assess if patient is interested in either independent living or assisted living - Lindsey Frank indicates she is unsure as she has not spoken directly with the patient Advised SW would contact the patient to explore what she is interested in and discuss placement process Determined the patient has yet to begin home health but [facility] caregiver indicated the patient was contacted by home health and advised her services would start later this week SW will follow up with the patient over the next 3 days to review desired level of care and process for each community  Patient Goals/Self-Care Activities patient will:  -Work with SW to obtain desired placement -Engage with embedded Medical illustrator to address care management needs  Follow Up Plan:  SW  will continue to follow       Follow Up Plan: SW will follow up with patient by phone over the next 3 days      Bevelyn Ngo, BSW, CDP Social Worker, Certified Dementia Practitioner [facility] Family Medicine/ [facility] Care Management [phone]

## 2021-08-02 NOTE — Telephone Encounter (Signed)
  Care Management   Follow Up Note   08/02/2021 Name: Lindsey Frank MRN: [id number] DOB: 1945-12-31   Referred by: [name], MD Reason for referral : Chronic Care Management (RN CM Follow up call )   An unsuccessful telephone outreach was attempted today. The patient was referred to the case management team for assistance with care management and care coordination.   Follow Up Plan: A HIPPA compliant phone message was left for the patient providing contact information and requesting a return call.   Delsa Sale, RN, BSN, CCM Care Management Coordinator [facility] Care Management/[facility]  Direct Phone: [phone]

## 2021-08-03 ENCOUNTER — Ambulatory Visit: Payer: Self-pay

## 2021-08-03 ENCOUNTER — Telehealth: Payer: Self-pay | Admitting: *Deleted

## 2021-08-03 ENCOUNTER — Telehealth: Payer: Self-pay

## 2021-08-03 DIAGNOSIS — R296 Repeated falls: Secondary | ICD-10-CM

## 2021-08-03 DIAGNOSIS — G250 Essential tremor: Secondary | ICD-10-CM

## 2021-08-03 DIAGNOSIS — F325 Major depressive disorder, single episode, in full remission: Secondary | ICD-10-CM

## 2021-08-03 DIAGNOSIS — R2689 Other abnormalities of gait and mobility: Secondary | ICD-10-CM

## 2021-08-03 DIAGNOSIS — R410 Disorientation, unspecified: Secondary | ICD-10-CM

## 2021-08-03 DIAGNOSIS — R4189 Other symptoms and signs involving cognitive functions and awareness: Secondary | ICD-10-CM

## 2021-08-03 DIAGNOSIS — K219 Gastro-esophageal reflux disease without esophagitis: Secondary | ICD-10-CM

## 2021-08-03 DIAGNOSIS — E782 Mixed hyperlipidemia: Secondary | ICD-10-CM

## 2021-08-03 DIAGNOSIS — R42 Dizziness and giddiness: Secondary | ICD-10-CM

## 2021-08-03 DIAGNOSIS — F17200 Nicotine dependence, unspecified, uncomplicated: Secondary | ICD-10-CM

## 2021-08-03 MED ORDER — NICOTINE 7 MG/24HR TD PT24: 7.0000 mg | MEDICATED_PATCH | Freq: Every day | TRANSDERMAL | 2 refills | Status: DC

## 2021-08-03 NOTE — Patient Instructions (Signed)
Visit Information  PATIENT GOALS:  Goals Addressed      Effective Long Term Care Planning   On track    Timeframe:  Long-Range Goal Priority:  High Start Date:  07/26/21                           Expected End Date:  07/26/22  Follow up date: 08/09/21         Patient Goals: - continue to use [facility] for caregiver support - contact Dr. Frances Furbish to request earlier follow up appointment - work with Home Health [facility] for evaluation of speech/swallowing and cognitive decline      - work with the CCM embedded BSW for assistance and support for placement into ALF             The patient verbalized understanding of instructions, educational materials, and care plan provided today and declined offer to receive copy of patient instructions, educational materials, and care plan.   Telephone follow up appointment with care management team member scheduled for: 08/09/21  Delsa Sale, RN, BSN, CCM Care Management Coordinator [facility] Care Management/[facility]  Direct Phone: [phone]

## 2021-08-03 NOTE — Chronic Care Management (AMB) (Signed)
Chronic Care Management   CCM RN Visit Note  08/03/2021 Name: Lindsey Frank MRN: [id number] DOB: 20-Aug-1946  Subjective: Lindsey Frank is a 75 y.o. year old female who is a primary care patient of Lindsey Arrow, MD. The care management team was consulted for assistance with disease management and care coordination needs.    Collaboration with embedded BSW Bevelyn Ngo  for  Case Collaboration   in response to provider referral for case management and/or care coordination services.   Consent to Services:  The patient was given information about Chronic Care Management services, agreed to services, and gave verbal consent prior to initiation of services.  Please see initial visit note for detailed documentation.   Patient agreed to services and verbal consent obtained.   Assessment: Review of patient past medical history, allergies, medications, health status, including review of consultants reports, laboratory and other test data, was performed as part of comprehensive evaluation and provision of chronic care management services.   SDOH (Social Determinants of Health) assessments and interventions performed:    CCM Care Plan  Allergies  Allergen Reactions   Trazodone And Nefazodone Nausea Only   Mysoline [Primidone] Nausea Only    Nausea and achey    Outpatient Encounter Medications as of 08/03/2021  Medication Sig Note   acetaminophen (TYLENOL) 500 MG tablet Take 1,000 mg by mouth every 6 (six) hours as needed for moderate pain or headache. (Patient not taking: Reported on 07/18/2021)    Calcium Carbonate-Vitamin D 600-200 MG-UNIT TABS Take 600 mg by mouth 2 (two) times daily. (Patient not taking: Reported on 07/18/2021)    diphenhydramine-acetaminophen (TYLENOL PM) 25-500 MG TABS tablet Take 2 tablets by mouth at bedtime as needed (sleep).    naproxen sodium (ALEVE) 220 MG tablet Take 440 mg by mouth as needed. 07/18/2021: Last dose 9am this morning.    omeprazole (PRILOSEC)  20 MG capsule Take 20 mg by mouth daily.    ondansetron (ZOFRAN ODT) 4 MG disintegrating tablet Take 1 tablet (4 mg total) by mouth every 8 (eight) hours as needed for nausea or vomiting.    rosuvastatin (CRESTOR) 40 MG tablet Take 1 tablet (40 mg total) by mouth daily. (Patient taking differently: Take 40 mg by mouth at bedtime.)    sertraline (ZOLOFT) 50 MG tablet Take 1 tablet (50 mg total) by mouth daily.    vitamin B-12 (CYANOCOBALAMIN) 1000 MCG tablet Take 1 tablet (1,000 mcg total) by mouth daily.    No facility-administered encounter medications on file as of 08/03/2021.    Patient Active Problem List   Diagnosis Date Noted   Impaired fasting glucose 07/04/2016   Depression, major, in remission (HCC) 11/17/2013   GERD (gastroesophageal reflux disease) 05/14/2013   Tobacco use disorder 04/24/2012   Mixed hyperlipidemia 10/26/2011   Pure hyperglyceridemia 03/30/2011   Depressive disorder, not elsewhere classified 03/30/2011   Benign essential tremor 02/27/2011   Pure hypercholesterolemia 02/27/2011   Migraine     Conditions to be addressed/monitored: Benign essential tremor, Depressive disorder, Mixed Hyperlipidemia, Dizziness, Vertigo, Confusion, Balance disorder, tobacco use disorder, history of falls, GERD      Care Plan : Confusion  Updates made by Riley Churches, RN since 08/03/2021 12:00 AM     Problem: Long-Term Care Planning   Priority: High     Long-Range Goal: Effective Long-Term Care Planning   Start Date: 07/26/2021  Expected End Date: 07/26/2022  Recent Progress: On track  Priority: High  Note:   Current Barriers:  Ineffective Self Health Maintenance in a patient with Benign essential tremor, Depressive disorder, Mixed Hyperlipidemia, Dizziness, Vertigo, Confusion, Balance disorder, tobacco use disorder, history of falls, GERD    Unable to self administer medications as prescribed Cognitive deficits  Clinical Goal(s):  Collaboration with Lindsey Arrow, MD  regarding development and update of comprehensive plan of care as evidenced by provider attestation and co-signature Inter-disciplinary care team collaboration (see longitudinal plan of care) patient will work with care management team to address care coordination and chronic disease management needs related to Disease Management Educational Needs Care Coordination Medication Management and Education Medication Reconciliation Psychosocial Support   Interventions:  07/26/21 completed successful outbound call with friend [name]  Evaluation of current treatment plan related to  Confusion , self-management and patient's adherence to plan as established by provider. Collaboration with Lindsey Arrow, MD regarding development and update of comprehensive plan of care as evidenced by provider attestation       and co-signature Inter-disciplinary care team collaboration (see longitudinal plan of care) Discussed home safety concerns due to cognitive changes and patient lives alone Determined patient is having difficulty with memory with such things as forgetting to take her medications, and possibility forgetting to eat  Determined patient is established with Dr. Frances Furbish; discussed and reviewed the recent brain CT w/o contrast showed the following noted:   IMPRESSION: No evidence of acute intracranial abnormality.   Generalized cerebral atrophy. Mild chronic small vessel ischemic changes within the cerebral white matter.   Tiny mucous retention cyst within the posterior right ethmoid air cells.   Small right mastoid effusion. Reviewed scheduled/upcoming provider appointments including: next Neuro follow up appointment scheduled for 08/24/21 @10 :45 AM Reviewed and discussed other reported neurological symptoms including "numbness and tingling" in her feet Discussed [name] will try to contact Dr. to ask for an earlier appointment if possible, she will accompanying [name] during her  appointment Instructed [name] to write down her questions and concerns to review with Dr. Frances Furbish at next visit in order to discuss all concerns  Determined patient started services with [facility] on 07/25/21, she will have caregiver assistance M-F 9-5 and possibly weekend visits  Advised caregiver regarding recent home health referral for SNV/PT, advised the referral is pending Educated on the benefits of ST to evaluate speech/swallowing and work with patient regarding cognitive decline  Collaboration via secure message with PCP Dr. 09/24/21 regarding ST orders, requested adding to Home Health referral  Discussed plans with patient for ongoing care management follow up and provided patient with direct contact information for care management team 08/03/21 Case Collaboration  Collaborated with embedded BSW 08/05/21 regarding patient's intentions to move to an assisted/independent living facility Discussed patient continues to receive caregiver assistance from [facility], however several of the caregivers are complaining of patients heavy smoking and have requested to be replaced Determined patient's friend Bevelyn Ngo notified [name] she will not be involved with Ms. Mastbrooks move to an ALF and plans to stay distant from assisting with additional care she may need due to she having personal and work restraints of her own, she asked that the CCM team work with Ms. Swallow directly  Self Care Activities:  Performs ADL's independently Performs IADL's independently Patient Goals: - continue to use [facility] for caregiver support - contact Dr. Enrique Sack to request earlier follow up appointment - work with Home Health ST for evaluation of speech/swallowing and cognitive decline - work with the CCM embedded BSW for assistance and support  for placement into ALF   Follow Up Plan: Telephone follow up appointment with care management team member scheduled for: 08/09/21      Plan:Telephone follow up appointment with care management team member scheduled for:  08/09/21  Delsa Sale, RN, BSN, CCM Care Management Coordinator [facility]  Direct Phone: [phone]

## 2021-08-03 NOTE — Telephone Encounter (Signed)
  Care Management   Follow Up Note   08/03/2021 Name: Lindsey Frank MRN: [id number] DOB: [birth year]   Referred by: [name], MD Reason for referral : Chronic Care Management (Unsuccessful call)   An unsuccessful telephone outreach was attempted today. The patient was referred to the case management team for assistance with care management and care coordination. SW left a HIPAA compliant voice message requesting a return call.  Follow Up Plan: The care management team will reach out to the patient again over the next 10 days.   Bevelyn Ngo, BSW, CDP Social Worker, Certified Dementia Practitioner [facility]/ [facility] Care Management [phone]

## 2021-08-03 NOTE — Telephone Encounter (Signed)
Ok to send (if not on med list, can look at info scanned in); these are likely OTC.  I did see notes from SW about her chain smoking in the bathroom at home.

## 2021-08-03 NOTE — Telephone Encounter (Signed)
Patient is asking for the smoking cessation patch that she was using while at [facility] to be called into [facility]-she is smoking very heavily right now.

## 2021-08-03 NOTE — Telephone Encounter (Signed)
Patient and friend Lindsey Frank, given to info.

## 2021-08-03 NOTE — Addendum Note (Signed)
Addended by: Debbrah Alar F on: 08/03/2021 03:53 PM   Modules accepted: Orders

## 2021-08-05 ENCOUNTER — Ambulatory Visit: Payer: Self-pay

## 2021-08-05 DIAGNOSIS — F325 Major depressive disorder, single episode, in full remission: Secondary | ICD-10-CM

## 2021-08-05 DIAGNOSIS — E782 Mixed hyperlipidemia: Secondary | ICD-10-CM

## 2021-08-05 DIAGNOSIS — G250 Essential tremor: Secondary | ICD-10-CM

## 2021-08-05 NOTE — Patient Instructions (Signed)
Social Worker Visit Information  Goals we discussed today:   Goals Addressed             This Visit's Progress    Barriers to Treatment Identified and Managed   On track    Timeframe:  Short-Term Goal Priority:  High Start Date:     10.6.22                                              Patient Goals/Self-Care Activities patient will: -Work with SW to obtain desired placement -Complete and submit [facility] application -Engage with embedded RN Care Manager to address care management needs         Materials Provided: Verbal education about placement process provided by phone  Patient verbalizes understanding of instructions provided today and agrees to view in MyChart.   Follow Up Plan: SW will follow up with patient by phone over the next 14 days  Bevelyn Ngo, BSW, CDP Social Worker, Certified Dementia Practitioner [facility] / [facility] Care Management [phone]

## 2021-08-05 NOTE — Chronic Care Management (AMB) (Signed)
Chronic Care Management    Social Work Note  08/05/2021 Name: Lindsey Frank MRN: [id number] DOB: November 13, 1945  Lindsey Frank is a 75 y.o. year old female who is a primary care patient of Lindsey Arrow, MD. The CCM team was consulted to assist the patient with chronic disease management and/or care coordination needs related to:  Benign Essential Tremor, Depression, Mixed Hyperlipidemia .   Engaged with patient by telephone for follow up visit in response to provider referral for social work chronic care management and care coordination services.   Consent to Services:  The patient was given information about Chronic Care Management services, agreed to services, and gave verbal consent prior to initiation of services.  Please see initial visit note for detailed documentation.   Patient agreed to services and consent obtained.   Assessment: Review of patient past medical history, allergies, medications, and health status, including review of relevant consultants reports was performed today as part of a comprehensive evaluation and provision of chronic care management and care coordination services.     SDOH (Social Determinants of Health) assessments and interventions performed:    Advanced Directives Status: Not addressed in this encounter.  CCM Care Plan  Allergies  Allergen Reactions   Trazodone And Nefazodone Nausea Only   Mysoline [Primidone] Nausea Only    Nausea and achey    Outpatient Encounter Medications as of 08/05/2021  Medication Sig Note   acetaminophen (TYLENOL) 500 MG tablet Take 1,000 mg by mouth every 6 (six) hours as needed for moderate pain or headache. (Patient not taking: Reported on 07/18/2021)    Calcium Carbonate-Vitamin D 600-200 MG-UNIT TABS Take 600 mg by mouth 2 (two) times daily. (Patient not taking: Reported on 07/18/2021)    diphenhydramine-acetaminophen (TYLENOL PM) 25-500 MG TABS tablet Take 2 tablets by mouth at bedtime as needed (sleep).     naproxen sodium (ALEVE) 220 MG tablet Take 440 mg by mouth as needed. 07/18/2021: Last dose 9am this morning.    nicotine (NICODERM CQ - DOSED IN MG/24 HR) 7 mg/24hr patch Place 1 patch (7 mg total) onto the skin daily.    omeprazole (PRILOSEC) 20 MG capsule Take 20 mg by mouth daily.    ondansetron (ZOFRAN ODT) 4 MG disintegrating tablet Take 1 tablet (4 mg total) by mouth every 8 (eight) hours as needed for nausea or vomiting.    rosuvastatin (CRESTOR) 40 MG tablet Take 1 tablet (40 mg total) by mouth daily. (Patient taking differently: Take 40 mg by mouth at bedtime.)    sertraline (ZOLOFT) 50 MG tablet Take 1 tablet (50 mg total) by mouth daily.    vitamin B-12 (CYANOCOBALAMIN) 1000 MCG tablet Take 1 tablet (1,000 mcg total) by mouth daily.    No facility-administered encounter medications on file as of 08/05/2021.    Patient Active Problem List   Diagnosis Date Noted   Impaired fasting glucose 07/04/2016   Depression, major, in remission (HCC) 11/17/2013   GERD (gastroesophageal reflux disease) 05/14/2013   Tobacco use disorder 04/24/2012   Mixed hyperlipidemia 10/26/2011   Pure hyperglyceridemia 03/30/2011   Depressive disorder, not elsewhere classified 03/30/2011   Benign essential tremor 02/27/2011   Pure hypercholesterolemia 02/27/2011   Migraine     Conditions to be addressed/monitored:  Benign Essential Tremor, Depression, Mixed Hyperlipidemia ; Level of care concerns  Care Plan : Social Work [facility] Care Plan  Updates made by Lindsey Frank since 08/05/2021 12:00 AM     Problem: Barriers to Treatment  Goal: Barriers to Treatment Identified and Managed   Start Date: 07/21/2021  This Visit's Progress: On track  Recent Progress: On track  Priority: High  Note:   Current Barriers:  Chronic disease management support and education needs related to  Benign Essential Tremor, Depression, Pure Hyperlipidemia, Dizziness, and Vertigo   Memory Deficits - patient poor  historian, unable to complete the call  Social Worker Clinical Goal(s):  patient will work with SW to identify and address any acute and/or chronic care coordination needs related to the self health management of  Benign Essential Tremor, Depression, [place] Hyperlipidemia, Dizziness, and Vertigo   Patient will engage with RN Care Manager to develop an individualized plan of care to address disease management needs  SW Interventions:  Inter-disciplinary care team collaboration (see longitudinal plan of care) Collaboration with Lindsey Arrow, MD regarding development and update of comprehensive plan of care as evidenced by provider attestation and co-signature Successful outbound call placed to the patient to discuss placement desires Determined the patient would like independent living at either [facility] or [facility] with [facility] coming in to assist with ADL needs Advised the patient [facility] requires an application to be completed prior to moving forward with admission Discussed this is an application the patient would need to complete as it asks very specific financial questions Provided an electronic copy of the application to the patients e-mail per patients request Discussed plan for SW to work with Dr. Lynelle Frank to complete an FL2 in order to send to [facility] per patients request Initiated FL2 and send to Dr. Lynelle Frank via fax for completion SW to continue to follow  Patient Goals/Self-Care Activities patient will:  -Work with SW to obtain desired placement -Complete and submit [facility] application -Engage with embedded RN Care Manager to address care management needs  Follow Up Plan:  SW will continue to follow       Follow Up Plan: SW will follow up with patient by phone over the next 14 days      Lindsey Frank, BSW, CDP Social Worker, Certified Dementia Practitioner [facility] Care Management [phone]

## 2021-08-09 ENCOUNTER — Telehealth: Payer: Medicare HMO

## 2021-08-09 ENCOUNTER — Telehealth: Payer: Self-pay

## 2021-08-09 NOTE — Telephone Encounter (Signed)
  Care Management   Follow Up Note   08/09/2021 Name: Lindsey Frank MRN: [id number] DOB: 07/23/1946   Referred by: [name], MD Reason for referral : Chronic Care Management (Initial RN CM Outreach - 2nd attempt )   A second unsuccessful telephone outreach was attempted today. The patient was referred to the case management team for assistance with care management and care coordination.   Follow Up Plan: A HIPPA compliant phone message was left for the patient providing contact information and requesting a return call.   Delsa Sale, RN, BSN, CCM Care Management Coordinator [facility] Care Management/[facility]  Direct Phone: [phone]

## 2021-08-11 ENCOUNTER — Telehealth: Payer: Self-pay | Admitting: Family Medicine

## 2021-08-11 NOTE — Telephone Encounter (Signed)
Pt left message that she can't sleep and needs sleep aid. She has not slept the last 2 nights.  She also want rx for lower back pain. Pt ph [phone] .  You do not have any openings today, nor do the other providers.  Please advise.

## 2021-08-11 NOTE — Telephone Encounter (Signed)
Called pt reached voice mail, left details of appt needed and Dr Madaline Savage suggestions.

## 2021-08-11 NOTE — Telephone Encounter (Signed)
This patient has worsening memory problems, and sleep meds (without full discussion) is not appropriate. Also, she has been having falls--unsure if her back pain could be related to a fall.  I'm not sending in a prescription without her being evaluated. She can use heat and tylenol as needed for pain, and/or can also try topical medications (such as SalonPas with lidocaine). If pain is severe, she can go to urgent care.

## 2021-08-12 ENCOUNTER — Telehealth: Payer: Self-pay

## 2021-08-12 NOTE — Telephone Encounter (Signed)
  Care Management   Follow Up Note   08/12/2021 Name: Lindsey Frank MRN: [id number] DOB: 08/29/1946   Referred by: [name], MD Reason for referral : Chronic Care Management (Initial RN CM Outreach - 3rd attempt )   Third unsuccessful telephone outreach was attempted today. The patient was referred to the case management team for assistance with care management and care coordination. The patient's primary care provider has been notified of our unsuccessful attempts to make or maintain contact with the patient. The care management team is pleased to engage with this patient at any time in the future should he/she be interested in assistance from the care management team. I spoke with [name] briefly today, unfortunately she states now is not a good time to speak with me.   Follow Up Plan: Telephone follow up appointment with care management team member scheduled for: 08/30/21  Delsa Sale, RN, BSN, CCM Care Management Coordinator [facility] Care Management/[facility]  Direct Phone: [phone]

## 2021-08-15 ENCOUNTER — Ambulatory Visit: Payer: Medicare HMO | Admitting: Neurology

## 2021-08-15 ENCOUNTER — Encounter: Payer: Self-pay | Admitting: Neurology

## 2021-08-15 VITALS — BP 105/66 | HR 67 | Ht 64.0 in | Wt 136.2 lb

## 2021-08-15 DIAGNOSIS — R42 Dizziness and giddiness: Secondary | ICD-10-CM | POA: Diagnosis not present

## 2021-08-15 DIAGNOSIS — R69 Illness, unspecified: Secondary | ICD-10-CM | POA: Diagnosis not present

## 2021-08-15 DIAGNOSIS — E7800 Pure hypercholesterolemia, unspecified: Secondary | ICD-10-CM | POA: Diagnosis not present

## 2021-08-15 DIAGNOSIS — I951 Orthostatic hypotension: Secondary | ICD-10-CM

## 2021-08-15 DIAGNOSIS — R296 Repeated falls: Secondary | ICD-10-CM | POA: Diagnosis not present

## 2021-08-15 DIAGNOSIS — R413 Other amnesia: Secondary | ICD-10-CM

## 2021-08-15 DIAGNOSIS — F325 Major depressive disorder, single episode, in full remission: Secondary | ICD-10-CM | POA: Diagnosis not present

## 2021-08-15 DIAGNOSIS — E782 Mixed hyperlipidemia: Secondary | ICD-10-CM

## 2021-08-15 MED ORDER — DONEPEZIL HCL 5 MG PO TABS
5.0000 mg | ORAL_TABLET | Freq: Every day | ORAL | 5 refills | Status: DC
Start: 2021-08-15 — End: 2021-11-14

## 2021-08-15 NOTE — Progress Notes (Signed)
Subjective:    Patient ID: Lindsey Frank is a 75 y.o. female.  HPI    Interim history:   Lindsey Frank is a 75 year old right-handed woman with an underlying medical history of reflux disease, hyperlipidemia, depression, essential tremor, history of endometriosis and borderline overweight state, who present for Follow-up consultation of her balance issues and history of falls.  I had evaluated her on 05/23/2021, at which time she was found to have orthostatic hypotension.  She had a nonspecific gait instability and we talked about her fall risk.  She was advised to proceed with a brain MRI as well as carotid Doppler ultrasound.  She was advised to talk to her prescriber regarding her beta-blocker as she had mild bradycardia.  She was also advised to talk about her blood pressure management.  She was advised that clonazepam was a contributor to balance problems and cognitive sluggishness.  She had a brain MRI with and without contrast on 06/07/2021 I reviewed the results:   IMPRESSION:    Abnormal MRI brain (with and without) demonstrating: - Mild atrophy and ventriculomegaly on ex vacuo basis. - Mild chronic small vessel ischemic disease.  - No acute findings.  She had on 06/09/2021 and I reviewed the results: Bilateral ICA had 1 to 39% stenosis.  Bilateral vertebral arteries demonstrated antegrade flow.  The patient was notified of her test results.  She had a recent head CT without contrast on 07/25/2021 through the emergency room with indication of headache, intracranial hemorrhage suspected, headache after fall, concern for delayed bleed.  Decreased mobility, bilateral lower extremity weakness, constipation.  I reviewed the results:  IMPRESSION: No evidence of acute intracranial abnormality.   Generalized cerebral atrophy. Mild chronic small vessel ischemic changes within the cerebral white matter.   Tiny mucous retention cyst within the posterior right ethmoid air cells.   Small  right mastoid effusion.  She had a head CT without contrast as well as cervical spine CT without contrast and maxillofacial CT without contrast on 07/16/2021 after a fall and I reviewed the results: IMPRESSION: 1. Mild cerebral atrophy and microvascular disease changes of the supratentorial brain. 2. No acute intracranial abnormality.   IMPRESSION: 1. No acute cervical spine fracture or subluxation. 2. Multilevel degenerative changes, most notably in the form of multilevel facet joint hypertrophy. IMPRESSION: Negative for acute fracture or dislocation of the facial bones.    Today, 08/15/2021: She reports is a walker but not always in the house.  She tries to hydrate well.  She is working on smoking cessation.  She is off of clonazepam and off of her propranolol.  She has a caretaker from 8 AM to 2 PM and from 2 PM to 8 PM daily, she has had caretakers around-the-clock for the past month and recently reduced to just during the day.  She denies any recent spinning sensations or vertigo's type spells but does feel lightheaded at times.  She had a headache last week on the top of her head and Tylenol helped, headache resolved.  She has an appointment with ENT next week.  Her memory is not good, she endorses forgetfulness.  She has intermittent numbness in the toes.  Previously:   05/23/21: (She) reports difficulty with her balance.  She has had some falls.  She reports intermittent vertigo type symptoms.  She feels that she has positional vertigo off and on since she fell and hit her head several weeks ago.  She had a fall in March 2022 at the grocery  store per her friend.  She reports that her shopping cart was pushed out of the way and she was actually leaning on the cart and that is why she fell.  She has had intermittent weakness affecting her legs but no sudden onset of one-sided weakness or numbness or tingling or droopy face or slurring of speech.  Lindsey Frank is also worried about her memory loss  which has been ongoing for the past few months.  Patient is not concerned about memory issues but does report word finding difficulty and losing her train of thought, some forgetfulness.  She has not driven a car in the past several weeks.  She is not aware of any family history of memory loss or dementia.  She has been using a 4 wheeled walker for the past week or so.  Prior to that she used a 2 wheeled walker and a 4 pronged cane.  She quit smoking about a month ago and has been on a nicotine patch.  She drinks alcohol rarely.  She drinks caffeine in the form of coffee, 1 cup/day.  She does not drink a whole lot of water, estimates that she drinks about 3-4 8 ounce bottles of water per day.  She likes to drink caffeine free diet soda. I reviewed your office note from 05/18/2021.  She had a recent head CT without contrast on 05/11/2021 after a fall.  I reviewed the results: Impression: No acute or traumatic finding.  Age related atrophy.  She recently into [facility].  She has fallen several times, unclear how many times, no injuries thankfully, no loss of consciousness. She has felt lightheaded upon standing.  She feels off balance when she suddenly stands up.  She has had intermittent symptoms of spinning sensation especially when she turns in bed.  She has not seen ENT.   Of note, she is on long-acting Inderal 80 mg once daily and she has been on clonazepam.  She is currently on 1 mg strength twice daily.  She has been on clonazepam for years.   I had seen her several years ago for essential tremor.  Prior to that she had seen Dr. Erling Cruz. She has had no recent blood work in the past couple of months with the exception of A1c on 03/24/2021, which was 5.7.         07/14/14: 75 year old right-handed woman with an underlying medical history of reflux disease, migraine headaches, depression, hyperlipidemia, status post appendectomy, tonsillectomy and adenoidectomy, as well as laparoscopic surgery  for endometriosis, who presents for followup consultation of her essential tremor. She is unaccompanied today. I first met her on 01/08/2014 at the request of her primary care physician, at which time she reported a long-standing history of essential tremor. She used to see Dr. Felecia Shelling in [facility] in the past, but had not seen any neurologist in years. At the time of her first visit with me I suggested changing her nadolol to Inderal LA 80 mg once daily. I did keep her on her clonazepam, but suggested we consider tapering the clonazepam in the long run.   Today, she reports that she could not tolerate it in a row. She went back to nadolol and continues with clonazepam.   She was stable with with clonazepam and nadolol for years. She retired in 2011. She also saw Dr. Erling Cruz in or around 2011 and was tested for MS, but was told she did not have MS. She could not tolerate mysoline, which made her  sick. Nadolol is expensive for. She has no FHx of ET, but there is a FHx of dementia and possibly PD. Her symptoms started in the 90s and she had a head tremor and bilateral hand tremors, and she feels she has progressed. She takes nadolol 40 mg in AM and 80 mg at night. Dr. Erling Cruz encouraged her to come off of clonazepam. She tried other meds, but no gabapentin or topamax. She has not been on propranolol. She rarely drinks alcohol. She smokes about 1/2 ppd. She is trying to quit smoking.   Her Past Medical History Is Significant For: Past Medical History:  Diagnosis Date   Abnormal Pap smear of cervix 2019   HR HPV + recurrent    Depression    Elevated C-reactive protein (CRP) 01/2005   Elevated cholesterol 01/2005   Elevated triglycerides with high cholesterol 01/2005   GERD (gastroesophageal reflux disease)    History of endometriosis    Migraine    Tremor, essential     Her Past Surgical History Is Significant For: Past Surgical History:  Procedure Laterality Date   BREAST LUMPECTOMY Right 1992   small  lumpectomy for benign tumor   CATARACT EXTRACTION Left 01/02/2018   Dr. Kathrin Penner   COSMETIC SURGERY  2008   neck lift   LAPAROSCOPY  1981   endometriosis   TONSILECTOMY, ADENOIDECTOMY, BILATERAL MYRINGOTOMY [place]   age 37   [place]   with appendectomy    Her Family History Is Significant For: Family History  Problem Relation Age of Onset   Diabetes Mother    Heart disease Father 18   Stroke Father 58   Throat cancer Father    Depression Brother    Stomach cancer Maternal Aunt    Depression Maternal Uncle        suicide   Alzheimer's disease Cousin     Her Social History Is Significant For: Social History   Socioeconomic History   Marital status: Divorced    Spouse name: Not on file   Number of children: 1   Years of education: college   Highest education level: Not on file  Occupational History   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)    Employer: [employer]  Tobacco Use   Smoking status: Former    Packs/day: 0.25    Years: 30.00    Pack years: 7.50    Types: Cigarettes   Smokeless tobacco: Never   Tobacco comments:    quit 12/2014, relapsed with stress, quit again end of July 2016  Pt states she is on the patch   Vaping Use   Vaping Use: Never used  Substance and Sexual Activity   Alcohol use: Yes    Alcohol/week: 0.0 - 1.0 standard drinks    Comment: (occasional Tia Maria), occasionally on weekends   Drug use: No   Sexual activity: Not Currently  Other Topics Concern   Not on file  Social History Narrative   Lives alone.  Son lives in [place], 4 grandchildren   College Education   Right handed   Caffeine one cup daily (tea with milk).   Social Determinants of Health   Financial Resource Strain: Not on file  Food Insecurity: No Food Insecurity   Worried About Charity fundraiser in the Last Year: Never true   Ran Out of Food in the Last Year: Never true  Transportation Needs: No Transportation Needs   Lack of  Transportation (Medical): No  Lack of Transportation (Non-Medical): No  Physical Activity: Not on file  Stress: Not on file  Social Connections: Not on file    Her Allergies Are:  Allergies  Allergen Reactions   Trazodone And Nefazodone Nausea Only   Mysoline [Primidone] Nausea Only    Nausea and achey  :   Her Current Medications Are:  Outpatient Encounter Medications as of 08/15/2021  Medication Sig   acetaminophen (TYLENOL) 500 MG tablet Take 1,000 mg by mouth every 6 (six) hours as needed for moderate pain or headache.   Calcium Carbonate-Vitamin D 600-200 MG-UNIT TABS Take 600 mg by mouth 2 (two) times daily.   diphenhydramine-acetaminophen (TYLENOL PM) 25-500 MG TABS tablet Take 2 tablets by mouth at bedtime as needed (sleep).   naproxen sodium (ALEVE) 220 MG tablet Take 440 mg by mouth as needed.   nicotine (NICODERM CQ - DOSED IN MG/24 HR) 7 mg/24hr patch Place 1 patch (7 mg total) onto the skin daily.   omeprazole (PRILOSEC) 20 MG capsule Take 20 mg by mouth daily.   ondansetron (ZOFRAN ODT) 4 MG disintegrating tablet Take 1 tablet (4 mg total) by mouth every 8 (eight) hours as needed for nausea or vomiting.   rosuvastatin (CRESTOR) 40 MG tablet Take 1 tablet (40 mg total) by mouth daily. (Patient taking differently: Take 40 mg by mouth at bedtime.)   sertraline (ZOLOFT) 50 MG tablet Take 1 tablet (50 mg total) by mouth daily.   vitamin B-12 (CYANOCOBALAMIN) 1000 MCG tablet Take 1 tablet (1,000 mcg total) by mouth daily.   No facility-administered encounter medications on file as of 08/15/2021.  :  Review of Systems:  Out of a complete 14 point review of systems, all are reviewed and negative with the exception of these symptoms as listed below:  Review of Systems  Neurological:        Pt states she fell and had a concussion and she is here for follow up visit. Pt state she did have a CT and MRI done both clear. Pt states she is still dizzy and both feet are numb .     Objective:  Neurological Exam  Physical Exam Physical Examination:   Vitals:   08/15/21 0835  BP: 105/66  Pulse: 67    On orthostatic testing, lying blood pressure and pulse was 118/68 with a pulse of 65, sitting 107/67 with a pulse of 67, standing 89/55 with a pulse of 70.     General Examination: The patient is a very pleasant 75 y.o. female in no acute distress. She appears well-developed and well-nourished and well groomed.    HEENT: Normocephalic, atraumatic, pupils are equal, round and reactive to light, corrective eyeglasses in place, tracking mildly impaired but stable.  She has no obvious head tremor or voice tremor today.  Neck is supple, no carotid bruits. Airway exam reveals mild mouth dryness, tongue protrudes centrally and palate elevates symmetrically.    Chest: Clear to auscultation without wheezing, rhonchi or crackles noted.   Heart: S1+S2+0, regular and normal without murmurs, rubs or gallops noted.   Abdomen: Soft, non-tender and non-distended.  Extremities: There is no edema in the distal lower extremities bilaterally.     Skin: Warm and dry without trophic changes noted.    Musculoskeletal: exam reveals no obvious joint deformities.   Neurologically:  Mental status: The patient is awake, alert and oriented in all 4 spheres. Her immediate and remote memory are mildly impaired.  She is not able to give a lot  of details to her history, details are provided by her friend.  Thought process is linear. Mood is normal and affect is normal.  MMSE - Mini Mental State Exam 05/23/2021  Orientation to time 2  Orientation to Place 5  Registration 3  Attention/ Calculation 2  Recall 1  Language- name 2 objects 2  Language- repeat 1  Language- follow 3 step command 2  Language- read & follow direction 1  Write a sentence 1  Copy design 0  Total score 20      On 05/23/2021: CDT: 4/4, AFT: 2/min.    Cranial nerves II - XII are as described above under HEENT  exam.  Motor exam: Normal bulk, strength and tone is noted. There is no drift, or rebound. There is no resting tremor. There is a slight postural tremor, but no significant action tremor, no intention tremor.   Reflexes are 1+ in the upper and lower extremities, toes are downgoing bilaterally.  Fine motor skills are generally fairly well-preserved with no decrement in amplitude with hand movements or foot movements.   Cerebellar testing: No dysmetria or intention tremor.  Sensory exam: intact to light touch. Gait, station and balance: She uses a 2 wheeled walker.  She is slowly and walks slowly and cautiously, no shuffling.  Assessment and Plan:  In summary, Lindsey Frank is a very pleasant 75 year old female with an underlying medical history of reflux disease, hyperlipidemia, depression, essential tremor, history of endometriosis and borderline overweight state, who presents for follow-up consultation of her gait and balance problems, history of falls, and also memory loss.  We evaluated her with a brain MRI and carotid Doppler exam, he does not have any acute findings on her brain scan, she had a recent fall and had a head CT.  He reports that she was diagnosed with a concussion.  She does not have any severe headaches at this time but does report memory issues.  She has been off of clonazepam and her beta-blocker but does continue to have significant orthostatic hypotension.  This is likely a contributor to her dizziness.  She currently does not endorse vertiginous symptoms.  She is scheduled to see ENT next week.  I agree with this evaluation.  She is advised to follow-up with her primary care physician regarding her orthostatic hypotension and ongoing dizziness.  She is advised to use her walker at all times and stay well-hydrated, continue to work on complete smoking cessation.  We talked about the importance of fall prevention.  We talked about her memory symptoms.  She is advised to start  Aricept generic 5 mg strength.  We will use this in low-dose for now to see if she tolerates it.  We talked about potential side effects and she was given written instructions via MyChart.  She was agreeable to starting donepezil.  She is advised to follow-up routinely to see one of our nurse practitioners in 3 to 4 months for a memory recheck and to see if we can increase the donepezil at that time to 10 mg daily.  I answered all her questions today and the patient was in agreement with the plan.  I spent 40 minutes in total face-to-face time and in reviewing records during pre-charting, more than 50% of which was spent in counseling and coordination of care, reviewing test results, reviewing medications and treatment regimen and/or in discussing or reviewing the diagnosis of memory loss, frequent falls, orthostatic hypotension, the prognosis and treatment options. Pertinent laboratory  and imaging test results that were available during this visit with the patient were reviewed by me and considered in my medical decision making (see chart for details).

## 2021-08-15 NOTE — Patient Instructions (Addendum)
It was nice to see you again today.  Your dizziness and fall risk may be in part related to your blood pressure dropping.  You still have significant blood pressure drop when you stand up.  Please follow-up with your primary care physician regarding this.  For your memory, I recommend you start a medication called Aricept (generic name: donepezil) 5 mg: take one pill each evening. Common side effects may include dry eyes, dry mouth, confusion, low pulse, low blood pressure, also GI related side effects (nausea, vomiting, diarrhea, constipation), headaches; rare side effects may include hallucinations and seizures.   Please use your walker at all times, try to hydrate well with water.  Continue to work on smoking cessation.  Please follow-up in this clinic to see one of our nurse practitioners for memory checkup in about 3 to 4 months, we will try to increase your memory medication from 5 mg to 10 mg at the time, if you tolerate it.

## 2021-08-16 ENCOUNTER — Telehealth: Payer: Self-pay

## 2021-08-16 NOTE — Telephone Encounter (Signed)
  Care Management   Follow Up Note   08/16/2021 Name: Lindsey Frank MRN: [id number] DOB: 05/28/1946   Referred by: [name], MD Reason for referral : Chronic Care Management   Outbound call placed to [facility] to follow up on status of FL2. Voice message left with Elpidio Anis, Economist requesting a return call.  Follow Up Plan:  SW will follow up over the next 5 days.  Bevelyn Ngo, BSW, CDP Social Worker, Certified Dementia Practitioner [facility] Care Management [phone]

## 2021-08-17 ENCOUNTER — Ambulatory Visit (INDEPENDENT_AMBULATORY_CARE_PROVIDER_SITE_OTHER): Payer: Medicare HMO

## 2021-08-17 DIAGNOSIS — F325 Major depressive disorder, single episode, in full remission: Secondary | ICD-10-CM

## 2021-08-17 DIAGNOSIS — E782 Mixed hyperlipidemia: Secondary | ICD-10-CM

## 2021-08-17 DIAGNOSIS — G250 Essential tremor: Secondary | ICD-10-CM

## 2021-08-17 NOTE — Chronic Care Management (AMB) (Signed)
Chronic Care Management    Social Work Note  08/17/2021 Name: Lindsey Frank MRN: [id number] DOB: 13-Aug-1946  Lindsey Frank is a 75 y.o. year old female who is a primary care patient of [name], MD. The CCM team was consulted to assist the patient with chronic disease management and/or care coordination needs related to:  Benign Essential Tremor, Depression, Mixed Hyperlipidemia, Level of Care Concerns .   Collaboration with Otilio Connors, Economist with [facility]  for  follow up on placement opportunity  in response to provider referral for social work chronic care management and care coordination services.   Consent to Services:  The patient was given information about Chronic Care Management services, agreed to services, and gave verbal consent prior to initiation of services.  Please see initial visit note for detailed documentation.   Patient agreed to services and consent obtained.   Assessment: Review of patient past medical history, allergies, medications, and health status, including review of relevant consultants reports was performed today as part of a comprehensive evaluation and provision of chronic care management and care coordination services.     SDOH (Social Determinants of Health) assessments and interventions performed:    Advanced Directives Status: Not addressed in this encounter.  CCM Care Plan  Allergies  Allergen Reactions   Trazodone And Nefazodone Nausea Only   Mysoline [Primidone] Nausea Only    Nausea and achey    Outpatient Encounter Medications as of 08/17/2021  Medication Sig Note   acetaminophen (TYLENOL) 500 MG tablet Take 1,000 mg by mouth every 6 (six) hours as needed for moderate pain or headache.    Calcium Carbonate-Vitamin D 600-200 MG-UNIT TABS Take 600 mg by mouth 2 (two) times daily.    diphenhydramine-acetaminophen (TYLENOL PM) 25-500 MG TABS tablet Take 2 tablets by mouth at bedtime as needed (sleep).    donepezil  (ARICEPT) 5 MG tablet Take 1 tablet (5 mg total) by mouth at bedtime.    naproxen sodium (ALEVE) 220 MG tablet Take 440 mg by mouth as needed. 07/18/2021: Last dose 9am this morning.    nicotine (NICODERM CQ - DOSED IN MG/24 HR) 7 mg/24hr patch Place 1 patch (7 mg total) onto the skin daily.    omeprazole (PRILOSEC) 20 MG capsule Take 20 mg by mouth daily.    ondansetron (ZOFRAN ODT) 4 MG disintegrating tablet Take 1 tablet (4 mg total) by mouth every 8 (eight) hours as needed for nausea or vomiting.    rosuvastatin (CRESTOR) 40 MG tablet Take 1 tablet (40 mg total) by mouth daily. (Patient taking differently: Take 40 mg by mouth at bedtime.)    sertraline (ZOLOFT) 50 MG tablet Take 1 tablet (50 mg total) by mouth daily.    vitamin B-12 (CYANOCOBALAMIN) 1000 MCG tablet Take 1 tablet (1,000 mcg total) by mouth daily.    No facility-administered encounter medications on file as of 08/17/2021.    Patient Active Problem List   Diagnosis Date Noted   Impaired fasting glucose 07/04/2016   Depression, major, in remission (HCC) 11/17/2013   GERD (gastroesophageal reflux disease) 05/14/2013   Tobacco use disorder 04/24/2012   Mixed hyperlipidemia 10/26/2011   Pure hyperglyceridemia 03/30/2011   Depressive disorder, not elsewhere classified 03/30/2011   Benign essential tremor 02/27/2011   Pure hypercholesterolemia 02/27/2011   Migraine     Conditions to be addressed/monitored:  Benign Essential Tremor, Depression, Mixed Hyperlipidemia ; Level of care concerns  Care Plan : Social Work [facility] Care Plan  Updates made  by Bevelyn Ngo since 08/17/2021 12:00 AM     Problem: Barriers to Treatment      Goal: Barriers to Treatment Identified and Managed   Start Date: 07/21/2021  Recent Progress: On track  Priority: High  Note:   Current Barriers:  Chronic disease management support and education needs related to  Benign Essential Tremor, Depression, Pure Hyperlipidemia, Dizziness, and Vertigo    Memory Deficits - patient poor historian, unable to complete the call  Social Worker Clinical Goal(s):  patient will work with SW to identify and address any acute and/or chronic care coordination needs related to the self health management of  Benign Essential Tremor, Depression, Pute Hyperlipidemia, Dizziness, and Vertigo   Patient will engage with RN Care Manager to develop an individualized plan of care to address disease management needs  SW Interventions:  Inter-disciplinary care team collaboration (see longitudinal plan of care) Collaboration with [name], MD regarding development and update of comprehensive plan of care as evidenced by provider attestation and co-signature Inbound call received from [facility], Sales Director with [facility] to discuss patient desire for placement Discussed Lindsey Frank has attempted to schedule a tour with the patient in order to move forward with admission. Unfortunately, the patient is relying on her financial advisor to transport her to the tour and he has been unavailable to assist  Advised Lindsey Frank SW would reach out to the patient to determine if another support person could assist her in this step Determined Lindsey Frank did not receive faxed Fl2 - she requests it be emailed to her directly at [email or web address] Collaboration with Debbrah Alar, RMA with patients primary care provider to request Fl2 be emailed to Lindsey Frank Unsuccessful outbound call placed to the patient to follow up on above items - voice message left requesting a return call  Patient Goals/Self-Care Activities patient will:  -Work with SW to obtain desired placement -Schedule a tour at [employer] -Complete and submit [facility] application -Engage with embedded RN Care Manager to address care management needs  Follow Up Plan:  SW will continue to follow       Follow Up Plan: SW will follow up with patient by phone over the next 3 days.       Bevelyn Ngo, BSW, CDP Social Worker, Certified Dementia Practitioner [facility] Care Management [phone]

## 2021-08-17 NOTE — Patient Instructions (Signed)
Social Worker Visit Information  Goals we discussed today:   Goals Addressed             This Visit's Progress    Barriers to Treatment Identified and Managed       Timeframe:  Short-Term Goal Priority:  High Start Date:     10.6.22                                              Patient Goals/Self-Care Activities patient will: -Work with SW to obtain desired placement -Schedule a tour at [employer] -Complete and submit [facility] application -Engage with embedded RN Care Manager to address care management needs         Materials Provided: No. Patient not reached.   Follow Up Plan: SW will follow up with patient by phone over the next 3 days  Bevelyn Ngo, BSW, CDP Social Worker, Certified Dementia Practitioner [facility] Care Management [phone]

## 2021-08-22 ENCOUNTER — Telehealth: Payer: Self-pay

## 2021-08-22 NOTE — Telephone Encounter (Signed)
  Care Management   Follow Up Note   08/22/2021 Name: Lindsey Frank MRN: [id number] DOB: [birth year]   Referred by: [name], MD Reason for referral : Chronic Care Management (Unsuccessful call)   A second unsuccessful telephone outreach was attempted today. The patient was referred to the case management team for assistance with care management and care coordination. SW attempted to contact both the patient and her son Lindsey Frank to follow up on care coordination needs. SW left a HIPAA compliant voice message for both the patient and her son requesting a return call.  Follow Up Plan: The care management team will reach out to the patient again over the next 14 days.   Bevelyn Ngo, BSW, CDP Social Worker, Certified Dementia Practitioner [facility] Care Management [phone]

## 2021-08-23 ENCOUNTER — Telehealth: Payer: Medicare HMO

## 2021-08-24 ENCOUNTER — Ambulatory Visit: Payer: Medicare HMO | Admitting: Neurology

## 2021-08-25 DIAGNOSIS — I951 Orthostatic hypotension: Secondary | ICD-10-CM | POA: Diagnosis not present

## 2021-08-25 DIAGNOSIS — R42 Dizziness and giddiness: Secondary | ICD-10-CM | POA: Diagnosis not present

## 2021-08-28 NOTE — Progress Notes (Deleted)
  Patient presents requesting referral to podiatry for evaluation of numbness in her feet.  She saw ENT last week.  She was sent due to her complaints of vertigo.  Apparently the vertigo resolved, but she is having ongoing dizziness/lightheadedness.  She was found to be orthostatic in their office (drop in systolic BP of 25 from supine to standing with reproducible dizziness per notes). She was advised to f/u with PCP regarding this.  Audiogram was recommended.  She last saw neurologist 10/31.  She was started on Aricept 5mg  at that time (MMSE had been 20/30 in 05/2021).  She mentioned numb feet at that visit (noted only in ROS, not in discussion or A/P).    She had low-normal B12 in August. At her October visit she had reported just recently starting 1000 mcg of B12. She continues to take B12 daily, due for recheck. Lab Results  Component Value Date   VITAMINB12 271 05/23/2021    Moving to [facility]? Smoking?  PMH, PSH, SH reviewed   ROS:  PHYSICAL EXAM: LMP 03/17/1999 (Approximate)   Wt Readings from Last 3 Encounters:  08/15/21 136 lb 3.2 oz (61.8 kg)  07/24/21 142 lb (64.4 kg)  07/18/21 135 lb 6.4 oz (61.4 kg)      ASSESSMENT/PLAN:  Needs orthostatic VS checked (BP/pulse) Taking B12? Due for recheck of level  Smoking? Using patches?

## 2021-08-29 ENCOUNTER — Ambulatory Visit: Payer: Medicare HMO | Admitting: Family Medicine

## 2021-08-29 ENCOUNTER — Ambulatory Visit: Payer: Self-pay

## 2021-08-29 DIAGNOSIS — I951 Orthostatic hypotension: Secondary | ICD-10-CM

## 2021-08-29 DIAGNOSIS — G250 Essential tremor: Secondary | ICD-10-CM

## 2021-08-29 DIAGNOSIS — R4189 Other symptoms and signs involving cognitive functions and awareness: Secondary | ICD-10-CM

## 2021-08-29 DIAGNOSIS — F0390 Unspecified dementia without behavioral disturbance: Secondary | ICD-10-CM

## 2021-08-29 DIAGNOSIS — R296 Repeated falls: Secondary | ICD-10-CM

## 2021-08-29 DIAGNOSIS — F325 Major depressive disorder, single episode, in full remission: Secondary | ICD-10-CM

## 2021-08-29 DIAGNOSIS — E782 Mixed hyperlipidemia: Secondary | ICD-10-CM

## 2021-08-29 NOTE — Patient Instructions (Signed)
Social Worker Visit Information  Goals we discussed today:   Goals Addressed             This Visit's Progress    COMPLETED: Barriers to Treatment Identified and Managed       Timeframe:  Short-Term Goal Priority:  High Start Date:     10.6.22                                              Patient Goals/Self-Care Activities patient will: -Engage with embedded RN Care Manager to address care management needs         Materials Provided: No. Patient not reached.  Follow Up Plan:  No SW follow up planned at this time. Please contact me as needed.   Bevelyn Ngo, BSW, CDP Social Worker, Certified Dementia Practitioner [facility] Care Management [phone]

## 2021-08-29 NOTE — Chronic Care Management (AMB) (Signed)
  Care Management   Follow Up Note   08/29/2021 Name: Lindsey Frank MRN: [id number] DOB: 10-Sep-1946   Referred by: [name], MD Reason for referral : Chronic Care Management (SW case closure)   Third unsuccessful telephone outreach was attempted today. The patient was referred to the case management team for assistance with care management and care coordination. The patient's primary care provider has been notified of our unsuccessful attempts to make or maintain contact with the patient. The care management team is pleased to engage with this patient at any time in the future should he/she be interested in assistance from the care management team.     Patient Care Plan: Social Work [facility] Care Plan  Completed 08/29/2021   Problem Identified: Barriers to Treatment Resolved 08/29/2021     Goal: Barriers to Treatment Identified and Managed Completed 08/29/2021  Start Date: 07/21/2021  Recent Progress: On track  Priority: High  Note:   Current Barriers:  Chronic disease management support and education needs related to  Benign Essential Tremor, Depression, Pure Hyperlipidemia, Dizziness, and Vertigo   Memory Deficits - patient poor historian, unable to complete the call  Social Worker Clinical Goal(s):  patient will work with SW to identify and address any acute and/or chronic care coordination needs related to the self health management of  Benign Essential Tremor, Depression, Pute Hyperlipidemia, Dizziness, and Vertigo   Patient will engage with RN Care Manager to develop an individualized plan of care to address disease management needs  SW Interventions:  Inter-disciplinary care team collaboration (see longitudinal plan of care) Collaboration with [name], MD regarding development and update of comprehensive plan of care as evidenced by provider attestation and co-signature Third unsuccessful outbound call placed to the patient to assist with care coordination needs. Voice  message left requesting a return call Collaboration with RN Care Manager to discuss plan for SW to sign off due to inability to maintain patient contact  Patient Goals/Self-Care Activities patient will:  -Engage with embedded RN Care Manager to address care management needs  Follow Up Plan:  No SW follow up planned at this time     Follow Up Plan:  No SW follow up planned. Patient will remain engaged with RN Care Manager to address care management needs.  Bevelyn Ngo, BSW, CDP Social Worker, Certified Dementia Practitioner [facility] Care Management [phone]

## 2021-08-30 ENCOUNTER — Ambulatory Visit: Payer: Self-pay

## 2021-08-30 ENCOUNTER — Telehealth: Payer: Medicare HMO

## 2021-08-30 DIAGNOSIS — E782 Mixed hyperlipidemia: Secondary | ICD-10-CM

## 2021-08-30 DIAGNOSIS — R296 Repeated falls: Secondary | ICD-10-CM

## 2021-08-30 DIAGNOSIS — R4189 Other symptoms and signs involving cognitive functions and awareness: Secondary | ICD-10-CM

## 2021-08-30 DIAGNOSIS — G250 Essential tremor: Secondary | ICD-10-CM

## 2021-08-30 DIAGNOSIS — F17200 Nicotine dependence, unspecified, uncomplicated: Secondary | ICD-10-CM

## 2021-08-30 DIAGNOSIS — K219 Gastro-esophageal reflux disease without esophagitis: Secondary | ICD-10-CM

## 2021-08-30 DIAGNOSIS — F325 Major depressive disorder, single episode, in full remission: Secondary | ICD-10-CM

## 2021-08-30 NOTE — Chronic Care Management (AMB) (Addendum)
  Care Management   Follow Up Note   08/30/2021 Name: Lindsey Frank MRN: [id number] DOB: 1946/08/21   Referred by: [name], MD Reason for referral : No chief complaint on file.  Fourth unsuccessful telephone outreach was attempted today. The patient was referred to the case management team for assistance with care management and care coordination. The patient's primary care provider has been notified of our unsuccessful attempts to make or maintain contact with the patient. The care management team is pleased to engage with this patient at any time in the future should she be interested in assistance from the care management team.   Follow Up Plan: We have been unable to make contact with the patient for follow up. The care management team is available to follow up with the patient after provider conversation with the patient regarding recommendation for care management engagement and subsequent re-referral to the care management team.   Delsa Sale, RN, BSN, CCM Care Management Coordinator [facility] Care Management/[facility]  Direct Phone: [phone]

## 2021-09-02 ENCOUNTER — Other Ambulatory Visit: Payer: Self-pay | Admitting: Family Medicine

## 2021-09-02 DIAGNOSIS — G250 Essential tremor: Secondary | ICD-10-CM

## 2021-09-14 DIAGNOSIS — E782 Mixed hyperlipidemia: Secondary | ICD-10-CM

## 2021-09-14 DIAGNOSIS — F325 Major depressive disorder, single episode, in full remission: Secondary | ICD-10-CM

## 2021-09-30 ENCOUNTER — Other Ambulatory Visit: Payer: Self-pay | Admitting: Family Medicine

## 2021-09-30 DIAGNOSIS — E782 Mixed hyperlipidemia: Secondary | ICD-10-CM

## 2021-10-04 ENCOUNTER — Other Ambulatory Visit: Payer: Medicare HMO

## 2021-10-06 ENCOUNTER — Ambulatory Visit: Payer: Medicare HMO | Admitting: Family Medicine

## 2021-10-22 ENCOUNTER — Other Ambulatory Visit: Payer: Self-pay | Admitting: Family Medicine

## 2021-10-22 DIAGNOSIS — F325 Major depressive disorder, single episode, in full remission: Secondary | ICD-10-CM

## 2021-10-22 DIAGNOSIS — F419 Anxiety disorder, unspecified: Secondary | ICD-10-CM

## 2021-11-14 ENCOUNTER — Ambulatory Visit: Payer: Medicare HMO | Admitting: Neurology

## 2021-11-14 ENCOUNTER — Telehealth: Payer: Self-pay | Admitting: Neurology

## 2021-11-14 ENCOUNTER — Encounter: Payer: Self-pay | Admitting: Neurology

## 2021-11-14 VITALS — BP 112/75 | HR 66 | Ht 64.0 in | Wt 136.4 lb

## 2021-11-14 DIAGNOSIS — I951 Orthostatic hypotension: Secondary | ICD-10-CM

## 2021-11-14 DIAGNOSIS — R413 Other amnesia: Secondary | ICD-10-CM | POA: Diagnosis not present

## 2021-11-14 DIAGNOSIS — R296 Repeated falls: Secondary | ICD-10-CM | POA: Diagnosis not present

## 2021-11-14 DIAGNOSIS — R42 Dizziness and giddiness: Secondary | ICD-10-CM

## 2021-11-14 MED ORDER — DONEPEZIL HCL 10 MG PO TABS: 10.0000 mg | ORAL_TABLET | Freq: Every day | ORAL | 5 refills | Status: DC

## 2021-11-14 NOTE — Progress Notes (Signed)
Subjective:    Patient ID: Lindsey Frank is a 76 y.o. female.  HPI    Interim history:   Lindsey Frank is a 76 year old right-handed woman with an underlying medical history of reflux disease, hyperlipidemia, depression, essential tremor, history of endometriosis and borderline overweight state, who presents for follow-up consultation of her Balance problem, recurrent falls and memory loss.  The patient is unaccompanied today. I last saw her on 08/15/2021, at which time Lindsey Frank reported working on smoking cessation.  Lindsey Frank had come off clonazepam and propranolol.  I suggested we start her on low-dose Aricept generic.  Today, 11/14/2021: Lindsey Frank reports that Lindsey Frank no longer takes Aricept.  Lindsey Frank stopped it after about a month.  Lindsey Frank reports that Lindsey Frank was told not to take it any longer and it was on her list of medicines that Lindsey Frank should not take but is not sure who told her not to take it.  Lindsey Frank recalls no side effects from it.  Lindsey Frank currently only takes Crestor and sertraline.  Lindsey Frank feels that her memory is stable but Lindsey Frank is forgetful.  Lindsey Frank is no longer on clonazepam or propranolol.  Lindsey Frank reports that Lindsey Frank recently fell out of bed, Lindsey Frank has had some vivid dreams.  Lindsey Frank did not injure herself thankfully but bumped her right knee which had a bruise. Lindsey Frank drinks alcohol daily in the evening in the form of liquor.  Lindsey Frank drinks about 2 bottles of water per day.  Lindsey Frank smokes about 3 to 4 cigarettes/day.   Previously:   I evaluated her on 05/23/2021, at which time Lindsey Frank was found to have orthostatic hypotension.  Lindsey Frank had a nonspecific gait instability and we talked about her fall risk.  Lindsey Frank was advised to proceed with a brain MRI as well as carotid Doppler ultrasound.  Lindsey Frank was advised to talk to her prescriber regarding her beta-blocker as Lindsey Frank had mild bradycardia.  Lindsey Frank was also advised to talk about her blood pressure management.  Lindsey Frank was advised that clonazepam was a contributor to balance problems and cognitive sluggishness.    Lindsey Frank had a brain MRI with and without contrast on 06/07/2021 I reviewed the results:   IMPRESSION:    Abnormal MRI brain (with and without) demonstrating: - Mild atrophy and ventriculomegaly on ex vacuo basis. - Mild chronic small vessel ischemic disease.  - No acute findings.   Lindsey Frank had a carotid Doppler ultrasound on 06/09/2021 and I reviewed the results: Bilateral ICA had 1 to 39% stenosis.  Bilateral vertebral arteries demonstrated antegrade flow.  The patient was notified of her test results.  Lindsey Frank had a recent head CT without contrast on 07/25/2021 through the emergency room with indication of headache, intracranial hemorrhage suspected, headache after fall, concern for delayed bleed.  Decreased mobility, bilateral lower extremity weakness, constipation.  I reviewed the results:  IMPRESSION: No evidence of acute intracranial abnormality.   Generalized cerebral atrophy. Mild chronic small vessel ischemic changes within the cerebral white matter.   Tiny mucous retention cyst within the posterior right ethmoid air cells.   Small right mastoid effusion.   Lindsey Frank had a head CT without contrast as well as cervical spine CT without contrast and maxillofacial CT without contrast on 07/16/2021 after a fall and I reviewed the results: IMPRESSION: 1. Mild cerebral atrophy and microvascular disease changes of the supratentorial brain. 2. No acute intracranial abnormality.   IMPRESSION: 1. No acute cervical spine fracture or subluxation. 2. Multilevel degenerative changes, most notably in the form of multilevel facet joint  hypertrophy. IMPRESSION: Negative for acute fracture or dislocation of the facial bones.       05/23/21: (Lindsey Frank) reports difficulty with her balance.  Lindsey Frank has had some falls.  Lindsey Frank reports intermittent vertigo type symptoms.  Lindsey Frank feels that Lindsey Frank has positional vertigo off and on since Lindsey Frank fell and hit her head several weeks ago.  Lindsey Frank had a fall in March 2022 at the grocery store  per her friend.  Lindsey Frank reports that her shopping cart was pushed out of the way and Lindsey Frank was actually leaning on the cart and that is why Lindsey Frank fell.  Lindsey Frank has had intermittent weakness affecting her legs but no sudden onset of one-sided weakness or numbness or tingling or droopy face or slurring of speech.  Lindsey Frank is also worried about her memory loss which has been ongoing for the past few months.  Patient is not concerned about memory issues but does report word finding difficulty and losing her train of thought, some forgetfulness.  Lindsey Frank has not driven a car in the past several weeks.  Lindsey Frank is not aware of any family history of memory loss or dementia.  Lindsey Frank has been using a 4 wheeled walker for the past week or so.  Prior to that Lindsey Frank used a 2 wheeled walker and a 4 pronged cane.  Lindsey Frank quit smoking about a month ago and has been on a nicotine patch.  Lindsey Frank drinks alcohol rarely.  Lindsey Frank drinks caffeine in the form of coffee, 1 cup/day.  Lindsey Frank does not drink a whole lot of water, estimates that Lindsey Frank drinks about 3-4 8 ounce bottles of water per day.  Lindsey Frank likes to drink caffeine free diet soda. I reviewed your office note from 05/18/2021.  Lindsey Frank had a recent head CT without contrast on 05/11/2021 after a fall.  I reviewed the results: Impression: No acute or traumatic finding.  Age related atrophy.  Lindsey Frank recently into [facility] assisted [facility].  Lindsey Frank has fallen several times, unclear how many times, no injuries thankfully, no loss of consciousness. Lindsey Frank has felt lightheaded upon standing.  Lindsey Frank feels off balance when Lindsey Frank suddenly stands up.  Lindsey Frank has had intermittent symptoms of spinning sensation especially when Lindsey Frank turns in bed.  Lindsey Frank has not seen ENT.   Of note, Lindsey Frank is on long-acting Inderal 80 mg once daily and Lindsey Frank has been on clonazepam.  Lindsey Frank is currently on 1 mg strength twice daily.  Lindsey Frank has been on clonazepam for years.   I had seen her several years ago for essential tremor.  Prior to that Lindsey Frank had seen Dr. Erling Cruz. Lindsey Frank has had  no recent blood work in the past couple of months with the exception of A1c on 03/24/2021, which was 5.7.      07/14/14: 76 year old right-handed woman with an underlying medical history of reflux disease, migraine headaches, depression, hyperlipidemia, status post appendectomy, tonsillectomy and adenoidectomy, as well as laparoscopic surgery for endometriosis, who presents for followup consultation of her essential tremor. Lindsey Frank is unaccompanied today. I first met her on 01/08/2014 at the request of her primary care physician, at which time Lindsey Frank reported a long-standing history of essential tremor. Lindsey Frank used to see Dr. Felecia Shelling in [facility] in the past, but had not seen any neurologist in years. At the time of her first visit with me I suggested changing her nadolol to Inderal LA 80 mg once daily. I did keep her on her clonazepam, but suggested we consider tapering the clonazepam in the long run.  Today, Lindsey Frank reports that Lindsey Frank could not tolerate propranolol. Lindsey Frank went back to nadolol and continues with clonazepam.   Lindsey Frank was stable with with clonazepam and nadolol for years. Lindsey Frank retired in 2011. Lindsey Frank also saw Dr. Erling Cruz in or around 2011 and was tested for MS, but was told Lindsey Frank did not have [name] could not tolerate mysoline, which made her sick. Nadolol is expensive for. Lindsey Frank has no FHx of ET, but there is a FHx of dementia and possibly PD. Her symptoms started in the 90s and Lindsey Frank had a head tremor and bilateral hand tremors, and Lindsey Frank feels Lindsey Frank has progressed. Lindsey Frank takes nadolol 40 mg in AM and 80 mg at night. Dr. Erling Cruz encouraged her to come off of clonazepam. Lindsey Frank tried other meds, but no gabapentin or topamax. Lindsey Frank has not been on propranolol. Lindsey Frank rarely drinks alcohol. Lindsey Frank smokes about 1/2 ppd. Lindsey Frank is trying to quit smoking.   Her Past Medical History Is Significant For: Past Medical History:  Diagnosis Date   Abnormal Pap smear of cervix 2019   HR HPV + recurrent    Depression    Elevated C-reactive protein (CRP)  01/2005   Elevated cholesterol 01/2005   Elevated triglycerides with high cholesterol 01/2005   GERD (gastroesophageal reflux disease)    History of endometriosis    Migraine    Tremor, essential     Her Past Surgical History Is Significant For: Past Surgical History:  Procedure Laterality Date   BREAST LUMPECTOMY Right 1992   small lumpectomy for benign tumor   CATARACT EXTRACTION Left 01/02/2018   Dr. Kathrin Penner   COSMETIC SURGERY  2008   neck lift   LAPAROSCOPY  1981   endometriosis   TONSILECTOMY, ADENOIDECTOMY, BILATERAL MYRINGOTOMY [place]   age 45   [place]   with appendectomy    Her Family History Is Significant For: Family History  Problem Relation Age of Onset   Diabetes Mother    Heart disease Father 22   Stroke Father 4   Throat cancer Father    Depression Brother    Stomach cancer Maternal Aunt    Depression Maternal Uncle        suicide   Alzheimer's disease Cousin    Neuropathy Neg Hx     Her Social History Is Significant For: Social History   Socioeconomic History   Marital status: Divorced    Spouse name: Not on file   Number of children: 1   Years of education: college   Highest education level: Not on file  Occupational History   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)    Employer: [employer]  Tobacco Use   Smoking status: Former    Packs/day: 0.25    Years: 30.00    Pack years: 7.50    Types: Cigarettes   Smokeless tobacco: Never   Tobacco comments:    quit 12/2014, relapsed with stress, quit again end of July 2016  Pt states Lindsey Frank is on the patch   Vaping Use   Vaping Use: Never used  Substance and Sexual Activity   Alcohol use: Yes    Alcohol/week: 0.0 - 1.0 standard drinks    Comment: (occasional [place]ia [place]), occasionally on weekends   Drug use: No   Sexual activity: Not Currently  Other Topics Concern   Not on file  Social History Narrative   Lives alone.  Son lives in [place], [place] grandchildren    College Education   Right  handed   Caffeine one cup daily (tea with milk).   Social Determinants of Health   Financial Resource Strain: Not on file  Food Insecurity: No Food Insecurity   Worried About Charity fundraiser in the Last Year: Never true   Ran Out of Food in the Last Year: Never true  Transportation Needs: No Transportation Needs   Lack of Transportation (Medical): No   Lack of Transportation (Non-Medical): No  Physical Activity: Not on file  Stress: Not on file  Social Connections: Not on file    Her Allergies Are:  Allergies  Allergen Reactions   Trazodone And Nefazodone Nausea Only   Mysoline [Primidone] Nausea Only    Nausea and achey  :   Her Current Medications Are:  Outpatient Encounter Medications as of 11/14/2021  Medication Sig   acetaminophen (TYLENOL) 500 MG tablet Take 1,000 mg by mouth every 6 (six) hours as needed for moderate pain or headache.   Calcium Carbonate-Vitamin D 600-200 MG-UNIT TABS Take 600 mg by mouth 2 (two) times daily.   diphenhydramine-acetaminophen (TYLENOL PM) 25-500 MG TABS tablet Take 2 tablets by mouth at bedtime as needed (sleep).   nicotine (NICODERM CQ - DOSED IN MG/24 HR) 7 mg/24hr patch Place 1 patch (7 mg total) onto the skin daily.   omeprazole (PRILOSEC) 20 MG capsule Take 20 mg by mouth daily.   rosuvastatin (CRESTOR) 40 MG tablet TAKE 1 TABLET BY MOUTH EVERY DAY   sertraline (ZOLOFT) 50 MG tablet TAKE 1 TABLET BY MOUTH EVERY DAY   vitamin B-12 (CYANOCOBALAMIN) 1000 MCG tablet Take 1 tablet (1,000 mcg total) by mouth daily.   donepezil (ARICEPT) 5 MG tablet Take 1 tablet (5 mg total) by mouth at bedtime.   naproxen sodium (ALEVE) 220 MG tablet Take 440 mg by mouth as needed.   ondansetron (ZOFRAN ODT) 4 MG disintegrating tablet Take 1 tablet (4 mg total) by mouth every 8 (eight) hours as needed for nausea or vomiting.   No facility-administered encounter medications on file as of 11/14/2021.  :  Review of Systems:   Out of a complete 14 point review of systems, all are reviewed and negative with the exception of these symptoms as listed below:  Review of Systems  Neurological:        Pt states Lindsey Frank is here for follow up  visit . Pt states Lindsey Frank is having numbness in tips of fingers and toes . Pt states Lindsey Frank is having  back pain. Pt states if Lindsey Frank is trying to pick something off the floor her back is starting to hurt . Pt states Lindsey Frank is having insomnia.    Objective:  Neurological Exam  Physical Exam Physical Examination:   Vitals:   11/14/21 0848  BP: 112/75  Pulse: 66    General Examination: The patient is a very pleasant 76 y.o. female in no acute distress. Lindsey Frank appears well-developed and well-nourished and well groomed.   HEENT: Normocephalic, atraumatic, pupils are equal, round and reactive to light, corrective eyeglasses in place, tracking mildly impaired but stable.  Lindsey Frank has an intermittent head tremor, no voice tremor today.  Neck is supple, no carotid bruits. Airway exam reveals mild mouth dryness, tongue protrudes centrally and palate elevates symmetrically.    Chest: Clear to auscultation without wheezing, rhonchi or crackles noted.   Heart: S1+S2+0, regular and normal without murmurs, rubs or gallops noted.   Abdomen: Soft, non-tender and non-distended.   Extremities: There is no edema in the distal lower  extremities bilaterally.     Skin: Warm and dry without trophic changes noted.    Musculoskeletal: exam reveals no obvious joint deformities.   Neurologically:   Mental status: The patient is awake, alert and oriented in all 4 spheres. Her immediate and remote memory are mildly impaired.  Lindsey Frank is not able to give a lot of details to her history.   Thought process is linear. Mood is normal and affect is normal.  MMSE - Mini Mental State Exam 11/14/2021 05/23/2021  Orientation to time 4 2  Orientation to Place 5 5  Registration 3 3  Attention/ Calculation 5 2  Recall 2 1  Language-  name 2 objects 2 2  Language- repeat 1 1  Language- follow 3 step command 3 2  Language- read & follow direction 0 1  Write a sentence 0 1  Copy design 1 0  Total score 26 20   On 05/23/2021: CDT: 4/4, AFT: 2/min.  On 11/14/2021: CDT: 4/4, AFT: 9/min.  Cranial nerves II - XII are as described above under HEENT exam.  Motor exam: Normal bulk, strength and tone is noted. There is no drift, or rebound. There is no resting tremor. There is a slight postural tremor, but no significant action tremor, no intention tremor.   Fine motor skills are generally fairly well-preserved with no decrement in amplitude with hand movements or foot movements.   Cerebellar testing: No dysmetria or intention tremor.  Sensory exam: intact to light touch. Gait, station and balance: Lindsey Frank stands without difficulty but pushes herself up.  Lindsey Frank is able to walk some without her cane, Lindsey Frank did not bring her walker.  No shuffling, preserved arm swing.     Assessment and Plan:  In summary, Lindsey Frank is a very pleasant 76 year old female with an underlying medical history of reflux disease, hyperlipidemia, depression, essential tremor, history of endometriosis and borderline overweight state, who presents for follow-up consultation of her gait and balance problems, history of falls, and memory loss. Lindsey Frank had a brain MRI and carotid Doppler exam, Lindsey Frank does not have any acute findings on her brain scan, Lindsey Frank had a CTH after a fall.  Lindsey Frank has been off of clonazepam and her beta-blocker and had evidence of orthostatic hypotension.  Lindsey Frank was advised to start Aricept in October 2022 but stopped it after about a month as I understand.  Unclear when exactly Lindsey Frank stopped it and why, Lindsey Frank does not endorse any side effects from it.  Lindsey Frank is willing to restart it.  Lindsey Frank is advised to start donepezil 10 mg strength half a pill each night for the first month and then increase it to 1 pill daily thereafter in the evenings.  Lindsey Frank is advised to follow-up  in this clinic to see one of our nurse practitioners in 6 months, sooner if needed.  We talked about the importance of fall prevention.  Lindsey Frank is advised to use her walker or cane at all times.  We talked about the importance of healthy lifestyle.  Lindsey Frank is advised to stay better hydrated with water, quit smoking and avoid drinking alcohol daily.  I answered all her questions today and Lindsey Frank was given written instructions as well as a new prescription for donepezil.  Lindsey Frank was in agreement with the plan. I spent 40 minutes in total face-to-face time and in reviewing records during pre-charting, more than 50% of which was spent in counseling and coordination of care, reviewing test results, reviewing medications and treatment regimen and/or  in discussing or reviewing the diagnosis of memory loss, gait d/o, the prognosis and treatment options. Pertinent laboratory and imaging test results that were available during this visit with the patient were reviewed by me and considered in my medical decision making (see chart for details).

## 2021-11-14 NOTE — Telephone Encounter (Signed)
Pt  would like a call from the nurse to clarification on instruction for donepezil (ARICEPT) 10 MG tablet. Pharmacy gave pt 2 bottles of Donepezil; one for 10mg  other was 5 mg.

## 2021-11-14 NOTE — Patient Instructions (Signed)
It was nice to see you again today.  Here is what we discussed today:  Restart Aricept (generic name: donepezil) 10 mg: take half a pill each evening for one month, then increase to one pill nightly thereafter. Common side effects may include dry eyes, dry mouth, confusion, low pulse, low blood pressure, also GI related side effects (nausea, vomiting, diarrhea, constipation), headaches; rare side effects may include hallucinations and seizures.  2. Please quit smoking.  3. Please avoid drinking alcohol daily.  4. Follow up with one of our nurse practitioners in 6 months.  5. Use your cane or walker at all times.

## 2021-11-14 NOTE — Telephone Encounter (Signed)
Spoke with patient.  She states she found her answer on MyChart.  She is to take 5 mg of donepezil every night for 1 month and then increase to 10 mg dose.  The patient stated the pharmacy gave her 5 mg and 10 mg.  She states she will use the 5 mg tablets first for one month so she does not have to cut the 10 mg in half. Then she will increase to 10 mg dose. Patient verbalized appreciation for the call.

## 2021-11-23 NOTE — Progress Notes (Signed)
Chief Complaint  Patient presents with   Medicare Wellness    Fasting AWV, given Dr. Ammie Ferrier new number to call and schedule pap. Does not want covid booster today. Asked to please get shingrix and Tdap at pharmacy. She has not had mammo since 2020 that she can recall. She states she has not returned the Cologuard. Asking for podiatist referral for numbness and tingling in her feet. States she is no longer smoking, drinks liquor couple nights a week.    Lindsey Frank is a 76 y.o. female who presents for annual physical exam, Medicare wellness visit and follow-up on chronic medical conditions.    She has the following concerns:  She is complaining of tingling and numbness in her feet for a few month.  It is constant, not related to shoes or activity.  Doesn't bother her at night, not affecting her sleep. Denies any burning or pain. She feels like it affects her walking some, afraid to walk outside with her neighbors, who walk in the road. She states she spoke to neurologist about this, who told her to ask PCP for podiatry referral.  She was having issues with vertigo, orthostasis and frequent falls over the summer.  She was in [place] for a short while.  She is currently living independently, preparing her own food, driving, and not having any problems.  She is not accompanied by anyone today. She denies any further dizziness, vertigo or falls.  Dementia:  She is under the care of neurology, last seen last month.  Aricept was restarted (was prescribed at prior visit, pt only took for a month, unclear why stopped, restarted at recent visit).  MMSE was 26/30 (up from 20/30 in 05/2021). She is tolerating this without side effects. Daily alcohol and smoking was noted in their notes. She denies any smoking, states she quit a couple of months ago, and is using the patches. She reports she drinks Amelia Jo twice a week, not daily. Neuro notes report she had fallen out of bed shortly before that  visit. She states that happened only once, and no other falls.  Tremor--She was weaned off of propanolol (due to orthostasis and bradycardia) and clonazepam (due to risks, falls).  She notes some recurrence of her essential tremor, but isn't particularly bothersome to her.  She notices it some in her head and some in her hands. She is now under the care of neurology.  Vitamin B12 was noted to be low in 05/2021. She was taking oral B12 at f/u visits during that time. She is currently taking 1000 mcg daily.  She has noted tingling in both feet as above.  No tingling in her hands.  Lab Results  Component Value Date   VITAMINB12 271 05/23/2021   Impaired fasting glucose: she continues to try and limit her sweets. She likes chocolate, but tries to limit portions.  Limits to 2 chocolate kisses daily. Occasional cookie. Limits rice, pasta, uses whole wheat bread. Doesn't eat a lot of starchy vegetables.  A1c in 03/2021 was 5.7.  Mixed hyperlipidemia:   She reports being compliant with her Crestor. Patient is reportedly following a low-fat, low cholesterol diet. Compliant with medications and denies medication side effects. Doesn't take fish oil, due to causing bad breath.  Lipids at goal on last check, due for recheck.  Lab Results  Component Value Date   CHOL 161 09/14/2020   HDL 60 09/14/2020   LDLCALC 75 09/14/2020   TRIG 150 (H) 09/14/2020   CHOLHDL  2.7 09/14/2020    GERD--She takes Prilosec OTC daily, and denies symptoms. She has had recurrent heartburn if she missed medication in the past, hasn't missed any recently.  Denies dysphagia.   Depression--she stopped Sertraline a few years ago, didn't have any recurrent depression after stopping (has had recurrent depression when stopped in the past, related to work stress), but she had problems with recurrent insomnia. OTC meds didn't help. She didn't tolerate trazadone due to nausea.  Sertraline was restarted at that time, at 18m, and insomnia  had resolved.  She again started having sleep trouble, and has been taking tylenol PM every night with good resuilts.   She gets up at least 2x/night to void, but is able to get back to sleep easily. There is a strong family h/o suicide in her family (5 people), and because of this is not interested in trying to stop the sertraline again.   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 She saw Dr. MSabra Heck and was due to see her again for repeat pap and HPV in 10/2019 (1 year follow-up).  She never scheduled this.  She was reminded of this at her physical in 09/2020, and she had declined GYN exam or pap by me at that visit.  Still hasn't scheduled.    Immunization History  Administered Date(s) Administered   Fluad Quad(high Dose 65+) 08/25/2019, 09/15/2020, 07/18/2021   Influenza Split 08/16/2012, 07/28/2014   Influenza, High Dose Seasonal PF 06/16/2015, 07/05/2016, 08/16/2017, 08/19/2018   PFIZER(Purple Top)SARS-COV-2 Vaccination 12/29/2019, 01/26/2020, 09/15/2020   Pneumococcal Conjugate-13 05/27/2014   Pneumococcal Polysaccharide-23 04/17/2010, 07/05/2016   Td 10/16/2000   Tdap 04/17/2010   Zoster, Live 03/03/2007   Last Pap smear:  08/2018, normal (atrophy), +HR HPV; tested negative for 16, 18/45 in 10/2018 by Dr. MSabra Heck Was due for 1 yr f/u pap, never done. Last mammogram: 09/2019 Last colonoscopy: 8/09; referred for Cologuard in 2019, 2020 and 2021, never done. Last DEXA:  08/2018, T-1.1 Left fem neck Exercise:  Walks around the entire store at the grocery store.  She won't walk outside due to her numbness in her feet.  Okay with the cart in the store.  Hasn't tried with the walker on the street, has one at home. No weight-bearing exercise. Dentist: every 6 months  Ophtho: regularly  Thyroid screen: Lab Results  Component Value Date   TSH 2.410 05/23/2021     Patient Care Team: KRita Ohara MD as PCP - General (Family Medicine) GYN (Dr. MSuezanne Cheshiredue to see Dentist  (Dr. DLavonne Chick   Ophtho (Dr. SKathrin Penner [facility] ([place] [place])   Neuro (Dr. ARexene Alberts  [place] Total Score 0       Falls screen:  Fall Risk  11/24/2021 11/24/2021 05/18/2021 09/15/2020 04/29/2020  Falls in the past year? 1 0 1 0 0  Number falls in past yr: 1 0 1 - -  Injury with Fall? 1 0 1 - -  Comment concussion, scrapes - scrape on knee and hit head in grocery store and at home - -  Risk for fall due to : History of fall(s);Other (Comment) No Fall Risks History of fall(s) - -  Follow up Falls evaluation completed;Education provided Falls evaluation completed Falls evaluation completed - -     Functional Status Survey: Is the patient deaf or have difficulty hearing?: No Does the patient have difficulty seeing, even when wearing glasses/contacts?: No Does  the patient have difficulty concentrating, remembering, or making decisions?: Yes (age related short term) Does the patient have difficulty walking or climbing stairs?: Yes (due to numbness in feet. Using cane right now.) Does the patient have difficulty dressing or bathing?: No Does the patient have difficulty doing errands alone such as visiting a doctor's office or shopping?: No  Mini-Cog Scoring: 5   Clock drawing was normal--no evidence of any tremor in her drawing.   End of Life Discussion:  Patient has a living will and medical power of attorney.    PMH, PSH, SH and FH were reviewed and updated.  Outpatient Encounter Medications as of 11/24/2021  Medication Sig Note   Calcium Carbonate-Vitamin D 600-200 MG-UNIT TABS Take 600 mg by mouth 2 (two) times daily.    diphenhydramine-acetaminophen (TYLENOL PM) 25-500 MG TABS tablet Take 2 tablets by mouth at bedtime as needed (sleep).    donepezil (ARICEPT) 10 MG tablet Take 1 tablet (10 mg total) by mouth at bedtime. Follow instructions provided in writing.    nicotine (NICODERM CQ -  DOSED IN MG/24 HR) 7 mg/24hr patch Place 1 patch (7 mg total) onto the skin daily.    omeprazole (PRILOSEC) 20 MG capsule Take 20 mg by mouth daily.    rosuvastatin (CRESTOR) 40 MG tablet TAKE 1 TABLET BY MOUTH EVERY DAY    sertraline (ZOLOFT) 50 MG tablet TAKE 1 TABLET BY MOUTH EVERY DAY    vitamin B-12 (CYANOCOBALAMIN) 1000 MCG tablet Take 1 tablet (1,000 mcg total) by mouth daily.    acetaminophen (TYLENOL) 500 MG tablet Take 1,000 mg by mouth every 6 (six) hours as needed for moderate pain or headache. (Patient not taking: Reported on 11/24/2021) 11/24/2021: As needed   [DISCONTINUED] naproxen sodium (ALEVE) 220 MG tablet Take 440 mg by mouth as needed. 07/18/2021: Last dose 9am this morning.    [DISCONTINUED] ondansetron (ZOFRAN ODT) 4 MG disintegrating tablet Take 1 tablet (4 mg total) by mouth every 8 (eight) hours as needed for nausea or vomiting.    No facility-administered encounter medications on file as of 11/24/2021.   Allergies  Allergen Reactions   Trazodone And Nefazodone Nausea Only   Mysoline [Primidone] Nausea Only    Nausea and achey    ROS: The patient denies anorexia, fever, vision changes, decreased hearing, ear pain, sore throat, breast concerns, chest pain, palpitations, syncope, dyspnea on exertion, cough, swelling, nausea, vomiting, diarrhea, constipation, abdominal pain, melena, hematochezia, indigestion/heartburn, hematuria, incontinence, dysuria, vaginal bleeding, discharge, odor or itch, genital lesions, joint pains, weakness, depression, anxiety, abnormal bleeding/bruising, or enlarged lymph nodes. Some postnasal drainage, chronic (since childhood--doesn't desire treatment). Insomnia is well controlled, but requires tylenol PM nightly. No further dizziness or falls. Feels balance is affected due to the numbness in her feet. Mild recurrent tremor since meds stopped, not bothersome. Memory is "about the same"--forgets why she walks in a room.     PHYSICAL EXAM:  BP  110/74    Pulse 68    Ht 5' 3.5" (1.613 m)    Wt 136 lb (61.7 kg)    LMP 03/17/1999 (Approximate)    BMI 23.71 kg/m    Wt Readings from Last 3 Encounters:  11/24/21 136 lb (61.7 kg)  11/14/21 136 lb 6.4 oz (61.9 kg)  08/15/21 136 lb 3.2 oz (61.8 kg)    General Appearance:   Alert, cooperative, no distress, appears stated age. Some occasional throat-clearing. Posture is poor, slumps/kyphotic, but frequently corrects and sits up straight.  (No longer  falling backwards as she previously did when sitting on exam table)  Head:   Normocephalic, without obvious abnormality, atraumatic.  Eyes:   PERRL, conjunctiva/corneas clear, EOM's intact, fundi benign    Ears:   Normal TM's and external ear canals    Nose:   Not examined, wearing mask due to COVID-19 pandemic   Throat:   Not examined, wearing mask due to COVID-19 pandemic   Neck:   Supple, no lymphadenopathy; thyroid: no enlargement/tenderness/ nodules; no carotid bruit or JVD    Back:   Spine nontender, no CVA tenderness. Some kyphosis of lower thoracic spine    Lungs:   Clear to auscultation bilaterally without wheezes, rales or ronchi; respirations unlabored    Chest Wall:   No tenderness or deformity    Heart:   Regular rate and rhythm, S1 and S2 normal, no murmur, rub or gallop. Rare skipped beat noted  Breast Exam:   No tenderness, masses, or nipple discharge or inversion. No axillary lymphadenopathy    Abdomen:   Soft, non-tender, nondistended, normoactive bowel sounds, no masses, no hepatosplenomegaly    Genitalia:   Deferred to GYN   Rectal:   Deferred (not undressed from waist down)  Extremities:   No clubbing, cyanosis or edema    Pulses:   2+ and symmetric all extremities    Skin:   Skin color, texture, turgor normal, no rashes or lesions.   Lymph nodes:   Cervical, supraclavicular, and axillary nodes normal    Neurologic:   Normal strength, sensation; reflexes are hyperreflexic (3+) and symmetric throughout. Only minimal tremor  noted on R, and no significant head-bobbing noted. She has decreased sensation to monofilament at both heels, and at the 1st metatarsal head on the left.                              Psych:  Normal mood, affect, hygiene and grooming   Lab Results  Component Value Date   HGBA1C 5.8 (A) 11/24/2021    ASSESSMENT/PLAN:  Annual physical exam  Medicare annual wellness visit, subsequent  Benign essential tremor - currently off meds (prev on BB and clonazepam), only very mild symptoms, not requiring any treatment  Depression, major, in remission ([place]) - cont sertraline  Mixed hyperlipidemia - on crestor, due for lipid check - Plan: Lipid panel  Gastroesophageal reflux disease, unspecified whether esophagitis present - discussed trial of backing off on daily use. If recurrent sx, can cont daily PPI  B12 deficiency - on daily oral supplement, due for B12 level.  This can contribute to neuropathy. Not sure how podiatry can help. May message neuro if B12 normal - Plan: Vitamin B12  Impaired fasting glucose - reviewed proper diet, encouraged daily exercise, wt loss - Plan: HgB A1c, Comprehensive metabolic panel  Pap smear abnormality of cervix/human papillomavirus (HPV) positive - past due to f/u with GYN for pap smear. 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 pt w/cognitive decline/memory loss. Recently restarted on aricept, under care of neuro  Screen for colon cancer - counseled re: all treatment options. She promises to return Cologuard (this will be 4th referral). - Plan: Cologuard  Medication monitoring encounter - Plan: CBC with Differential/Platelet, Comprehensive metabolic panel, Lipid panel, Vitamin B12  Insomnia, unspecified type - disc tylenol PM being higher risk med, encouraged to practice good sleep hygiene, and not use nightly   Discussed monthly self  breast exams and yearly mammograms (past due); at least 30 minutes of aerobic  activity at least 5 days/week, weight bearing exercise 2x/week; proper sunscreen use reviewed; healthy diet, including goals of calcium and vitamin D intake and alcohol recommendations (less than or equal to 1 drink/day) reviewed; regular seatbelt use; changing batteries in smoke detectors. Immunization recommendations discussed--continue yearly high dose flu shots  COVID booster recommended, declined. Shingrix recommended, risks/side effects reviewed, to get from pharmacy. Tdap due, to get from pharmacy.  Colon cancer screening recommendations reviewed, past due. She hasn't returned the Cologuard kit for the past 3 years.   Discussed Cologuard vs colonoscopy vs nothing  F/u 6 months (A1c at visit), and schedule CPE/AWV for 1 year  MOST form reviewed. Full Code, Full Care.     Medicare Attestation I have personally reviewed: The patient's medical and social history Their use of alcohol, tobacco or illicit drugs Their current medications and supplements The patient's functional ability including ADLs,fall risks, home safety risks, cognitive, and hearing and visual impairment Diet and physical activities Evidence for depression or mood disorders  The patient's weight, height, BMI have been recorded in the chart.  I have made referrals, counseling, and provided education to the patient based on review of the above and I have provided the patient with a written personalized care plan for preventive services.

## 2021-11-23 NOTE — Patient Instructions (Addendum)
HEALTH MAINTENANCE RECOMMENDATIONS:  It is recommended that you get at least 30 minutes of aerobic exercise at least 5 days/week (for weight loss, you may need as much as 60-90 minutes). This can be any activity that gets your heart rate up. This can be divided in 10-15 minute intervals if needed, but try and build up your endurance at least once a week.  Weight bearing exercise is also recommended twice weekly.  Eat a healthy diet with lots of vegetables, fruits and fiber.  "Colorful" foods have a lot of vitamins (ie green vegetables, tomatoes, red peppers, etc).  Limit sweet tea, regular sodas and alcoholic beverages, all of which has a lot of calories and sugar.  Up to 1 alcoholic drink daily may be beneficial for women (unless trying to lose weight, watch sugars).  Drink a lot of water.  Calcium recommendations are 1200-1500 mg daily (1500 mg for postmenopausal women or women without ovaries), and vitamin D 1000 IU daily.  This should be obtained from diet and/or supplements (vitamins), and calcium should not be taken all at once, but in divided doses.  Monthly self breast exams and yearly mammograms for women over the age of 70 is recommended.  Sunscreen of at least SPF 30 should be used on all sun-exposed parts of the skin when outside between the hours of 10 am and 4 pm (not just when at beach or pool, but even with exercise, golf, tennis, and yard work!)  Use a sunscreen that says "broad spectrum" so it covers both UVA and UVB rays, and make sure to reapply every 1-2 hours.  Remember to change the batteries in your smoke detectors when changing your clock times in the spring and fall. Carbon monoxide detectors are recommended for your home.  Use your seat belt every time you are in a car, and please drive safely and not be distracted with cell phones and texting while driving.   Lindsey Frank , Thank you for taking time to come for your Medicare Wellness Visit. I appreciate your ongoing  commitment to your health goals. Please review the following plan we discussed and let me know if I can assist you in the future.   This is a list of the screening recommended for you and due dates:  Health Maintenance  Topic Date Due   Zoster (Shingles) Vaccine (1 of 2) Never done   Colon Cancer Screening  05/26/2018   Tetanus Vaccine  04/17/2020   COVID-19 Vaccine (4 - Booster for Pfizer series) 11/10/2020   Mammogram  09/17/2021   Pneumonia Vaccine  Completed   Flu Shot  Completed   DEXA scan (bone density measurement)  Completed   Hepatitis C Screening: USPSTF Recommendation to screen - Ages 37-79 yo.  Completed   HPV Vaccine  Aged Out   You are past due for mammogram. Please call [facility] to schedule this. You are past due to get a follow-up pap smear from the gynecologist (due to having HPV on pap smears).  Please call Dr. Ammie Ferrier office and set up your visit (or we can do this for you if you prefer)  You are past due for colon cancer screening.  We have discussed this many times, and referred you for Cologuard testing for the last 3 years, and you never returned the test. Options for colon cancer screening include Cologuard, colonoscopy, or other fecal occult blood tests (looking for microscopic blood in the stool), which also requires you to submit a fecal sample (just in  a different way). If you choose not to do any type of colon cancer screening, that is your choice. If you develop any symptoms (blood in stool, weight loss, abdominal pain, etc), the findings may be more advanced than if found as precancers (polyps) from screening tests, before symptoms develop. We discussed this and you would again like to have the Cologuard done.  You have promised to return it!  I encourage you to get the bivalent COVID booster.  Please go to the pharmacy to get your tetanus booster (TdaP), it is past due.  I recommend getting the new shingles vaccine (Shingrix). Since you have Medicare, you  will need to get this from the pharmacy, as it is covered by Part D. This is a series of 2 injections, spaced 2 months apart.   This should be separated from other vaccines by at least 2 weeks.  As of 10/16/21, there should no longer be any out-of-pocket cost for these vaccines from the pharmacy.  Continue to use patches to avoid smoking (and eventually try and taper off). Please try and avoid alcohol entirely.  Consider trying to skip your Prilosec occasionally, and see if you have recurrent reflux symptoms.  If you don't, try taking it every other day for a while, and then perhaps cutting back further, if not having any problems. Always take it when having large meals, alcohol, spicy food, tomatoes, etc.  Try and look at some of the sleep hygiene recommendations on the handout.  See if you can skip the Tylenol PM some nights, and use it mainly if you haven't slept well in a couple of nights (rather than every night).

## 2021-11-24 ENCOUNTER — Other Ambulatory Visit: Payer: Self-pay

## 2021-11-24 ENCOUNTER — Encounter: Payer: Self-pay | Admitting: Family Medicine

## 2021-11-24 ENCOUNTER — Ambulatory Visit (INDEPENDENT_AMBULATORY_CARE_PROVIDER_SITE_OTHER): Payer: Medicare HMO | Admitting: Family Medicine

## 2021-11-24 VITALS — BP 110/74 | HR 68 | Ht 63.5 in | Wt 136.0 lb

## 2021-11-24 DIAGNOSIS — Z5181 Encounter for therapeutic drug level monitoring: Secondary | ICD-10-CM

## 2021-11-24 DIAGNOSIS — Z0000 Encounter for general adult medical examination without abnormal findings: Secondary | ICD-10-CM

## 2021-11-24 DIAGNOSIS — E538 Deficiency of other specified B group vitamins: Secondary | ICD-10-CM

## 2021-11-24 DIAGNOSIS — E782 Mixed hyperlipidemia: Secondary | ICD-10-CM | POA: Diagnosis not present

## 2021-11-24 DIAGNOSIS — R7301 Impaired fasting glucose: Secondary | ICD-10-CM

## 2021-11-24 DIAGNOSIS — G250 Essential tremor: Secondary | ICD-10-CM

## 2021-11-24 DIAGNOSIS — Z1211 Encounter for screening for malignant neoplasm of colon: Secondary | ICD-10-CM | POA: Diagnosis not present

## 2021-11-24 DIAGNOSIS — F17200 Nicotine dependence, unspecified, uncomplicated: Secondary | ICD-10-CM

## 2021-11-24 DIAGNOSIS — R87618 Other abnormal cytological findings on specimens from cervix uteri: Secondary | ICD-10-CM

## 2021-11-24 DIAGNOSIS — G319 Degenerative disease of nervous system, unspecified: Secondary | ICD-10-CM

## 2021-11-24 DIAGNOSIS — G4700 Insomnia, unspecified: Secondary | ICD-10-CM

## 2021-11-24 DIAGNOSIS — R69 Illness, unspecified: Secondary | ICD-10-CM | POA: Diagnosis not present

## 2021-11-24 DIAGNOSIS — K219 Gastro-esophageal reflux disease without esophagitis: Secondary | ICD-10-CM

## 2021-11-24 DIAGNOSIS — F325 Major depressive disorder, single episode, in full remission: Secondary | ICD-10-CM

## 2021-11-24 LAB — POCT GLYCOSYLATED HEMOGLOBIN (HGB A1C): Hemoglobin A1C: 5.8 % — AB (ref 4.0–5.6)

## 2021-11-25 LAB — COMPREHENSIVE METABOLIC PANEL
ALT: 14 IU/L (ref 0–32)
AST: 23 IU/L (ref 0–40)
Albumin/Globulin Ratio: 2.6 — ABNORMAL HIGH (ref 1.2–2.2)
Albumin: 5 g/dL — ABNORMAL HIGH (ref 3.7–4.7)
Alkaline Phosphatase: 69 IU/L (ref 44–121)
BUN/Creatinine Ratio: 18 (ref 12–28)
BUN: 17 mg/dL (ref 8–27)
Bilirubin Total: 0.4 mg/dL (ref 0.0–1.2)
CO2: 25 mmol/L (ref 20–29)
Calcium: 9.9 mg/dL (ref 8.7–10.3)
Chloride: 102 mmol/L (ref 96–106)
Creatinine, Ser: 0.92 mg/dL (ref 0.57–1.00)
Globulin, Total: 1.9 g/dL (ref 1.5–4.5)
Glucose: 100 mg/dL — ABNORMAL HIGH (ref 70–99)
Potassium: 4.6 mmol/L (ref 3.5–5.2)
Sodium: 142 mmol/L (ref 134–144)
Total Protein: 6.9 g/dL (ref 6.0–8.5)
eGFR: 65 mL/min/{1.73_m2} (ref >59)

## 2021-11-25 LAB — LIPID PANEL
Chol/HDL Ratio: 2.2 ratio (ref 0.0–4.4)
Cholesterol, Total: 157 mg/dL (ref 100–199)
HDL: 70 mg/dL (ref >39)
LDL Chol Calc (NIH): 61 mg/dL (ref 0–99)
Triglycerides: 157 mg/dL — ABNORMAL HIGH (ref 0–149)
VLDL Cholesterol Cal: 26 mg/dL (ref 5–40)

## 2021-11-25 LAB — CBC WITH DIFFERENTIAL/PLATELET
Basophils Absolute: 0.1 10*3/uL (ref 0.0–0.2)
Basos: 1 %
EOS (ABSOLUTE): 0.2 10*3/uL (ref 0.0–0.4)
Eos: 2 %
Hematocrit: 40.9 % (ref 34.0–46.6)
Hemoglobin: 13.5 g/dL (ref 11.1–15.9)
Immature Grans (Abs): 0 10*3/uL (ref 0.0–0.1)
Immature Granulocytes: 0 %
Lymphocytes Absolute: 2.8 10*3/uL (ref 0.7–3.1)
Lymphs: 29 %
MCH: 28.9 pg (ref 26.6–33.0)
MCHC: 33 g/dL (ref 31.5–35.7)
MCV: 88 fL (ref 79–97)
Monocytes Absolute: 0.8 10*3/uL (ref 0.1–0.9)
Monocytes: 8 %
Neutrophils Absolute: 5.9 10*3/uL (ref 1.4–7.0)
Neutrophils: 60 %
Platelets: 294 10*3/uL (ref 150–450)
RBC: 4.67 x10E6/uL (ref 3.77–5.28)
RDW: 12.8 % (ref 11.7–15.4)
WBC: 9.8 10*3/uL (ref 3.4–10.8)

## 2021-11-25 LAB — VITAMIN B12: Vitamin B-12: 1379 pg/mL — ABNORMAL HIGH (ref 232–1245)

## 2022-01-07 ENCOUNTER — Other Ambulatory Visit: Payer: Self-pay | Admitting: Family Medicine

## 2022-01-07 DIAGNOSIS — E782 Mixed hyperlipidemia: Secondary | ICD-10-CM

## 2022-01-09 ENCOUNTER — Telehealth: Payer: Self-pay | Admitting: Family Medicine

## 2022-01-09 ENCOUNTER — Other Ambulatory Visit: Payer: Self-pay | Admitting: *Deleted

## 2022-01-09 ENCOUNTER — Telehealth: Payer: Self-pay | Admitting: *Deleted

## 2022-01-09 DIAGNOSIS — G629 Polyneuropathy, unspecified: Secondary | ICD-10-CM

## 2022-01-09 DIAGNOSIS — R200 Anesthesia of skin: Secondary | ICD-10-CM

## 2022-01-09 NOTE — Telephone Encounter (Signed)
Okay for referral (pt stated neuro told us to do; I had been waiting on B12 results, which were normal). Not sure how much it will help, but okay for consult.  Dx numbness in feet/neuropathy ?

## 2022-01-09 NOTE — Telephone Encounter (Signed)
Patient advised and she will call neuro if needed. ?

## 2022-01-09 NOTE — Telephone Encounter (Signed)
Referral done and patient notified.

## 2022-01-09 NOTE — Telephone Encounter (Signed)
I do not recommend this for tremor.  She should f/u with her neurologist regarding recommendations to treat tremor. ?If anxiety is an issue, there are different medications for that, and appointment would be needed here to discuss. ?

## 2022-01-09 NOTE — Telephone Encounter (Signed)
Pt called and is wanting to know about a referral for a foot dr states she was suppose to have one the last time she come in, states her feet and toes are numb  ?

## 2022-01-09 NOTE — Telephone Encounter (Signed)
Patient called and stated that her tremor has become much worse and she would like to go back on clonazepam. ?

## 2022-01-09 NOTE — Telephone Encounter (Signed)
Left message for patient asking her to please return my call.  ?

## 2022-01-16 ENCOUNTER — Other Ambulatory Visit: Payer: Self-pay | Admitting: Family Medicine

## 2022-01-16 DIAGNOSIS — F325 Major depressive disorder, single episode, in full remission: Secondary | ICD-10-CM

## 2022-01-16 DIAGNOSIS — F419 Anxiety disorder, unspecified: Secondary | ICD-10-CM

## 2022-01-23 ENCOUNTER — Ambulatory Visit: Payer: Medicare HMO | Admitting: Podiatry

## 2022-01-23 DIAGNOSIS — G629 Polyneuropathy, unspecified: Secondary | ICD-10-CM | POA: Diagnosis not present

## 2022-01-25 NOTE — Progress Notes (Signed)
Subjective:  ? ?Patient ID: Lindsey Frank, female   DOB: 76 y.o.   MRN: [id number]  ? ?HPI ?75 year old female presents the office today with concerns of numbness to her toes that go halfway up the leg.  She states it started after she sustained a concussion and she was treated by neurology for this.  She gets numbness to her toes going on the heels and she points out midway up the leg is where it stops.  This is start about 6 months ago.  She previously did have a vitamin B12 deficiency which has improved.  She does not smoke.  She does not drink small amount of the core at nighttime daily. ? ? ?Review of Systems  ?All other systems reviewed and are negative. ? ?Past Medical History:  ?Diagnosis Date  ? Abnormal Pap smear of cervix 2019  ? HR HPV + recurrent   ? Depression   ? Elevated C-reactive protein (CRP) 01/2005  ? Elevated cholesterol 01/2005  ? Elevated triglycerides with high cholesterol 01/2005  ? GERD (gastroesophageal reflux disease)   ? History of endometriosis   ? Migraine   ? Tremor, essential   ? ? ?Past Surgical History:  ?Procedure Laterality Date  ? BREAST LUMPECTOMY Right 1992  ? small lumpectomy for benign tumor  ? CATARACT EXTRACTION Left 01/02/2018  ? Dr. Dagoberto Ligas  ? COSMETIC SURGERY  2008  ? neck lift  ? LAPAROSCOPY  1981  ? endometriosis  ? TONSILECTOMY, ADENOIDECTOMY, BILATERAL MYRINGOTOMY AND TUBES  1953  ? age 72  ? UTERINE FIBROID SURGERY  1977  ? with appendectomy  ? ? ? ?Current Outpatient Medications:  ?  acetaminophen (TYLENOL) 500 MG tablet, Take 1,000 mg by mouth every 6 (six) hours as needed for moderate pain or headache. (Patient not taking: Reported on 11/24/2021), Disp: , Rfl:  ?  Calcium Carbonate-Vitamin D 600-200 MG-UNIT TABS, Take 600 mg by mouth 2 (two) times daily., Disp: , Rfl:  ?  diphenhydramine-acetaminophen (TYLENOL PM) 25-500 MG TABS tablet, Take 2 tablets by mouth at bedtime as needed (sleep)., Disp: , Rfl:  ?  donepezil (ARICEPT) 10 MG tablet, Take 1 tablet (10  mg total) by mouth at bedtime. Follow instructions provided in writing., Disp: 30 tablet, Rfl: 5 ?  nicotine (NICODERM CQ - DOSED IN MG/24 HR) 7 mg/24hr patch, Place 1 patch (7 mg total) onto the skin daily., Disp: 30 patch, Rfl: 2 ?  omeprazole (PRILOSEC) 20 MG capsule, Take 20 mg by mouth daily., Disp: , Rfl:  ?  rosuvastatin (CRESTOR) 40 MG tablet, TAKE 1 TABLET BY MOUTH EVERY DAY, Disp: 90 tablet, Rfl: 0 ?  sertraline (ZOLOFT) 50 MG tablet, TAKE 1 TABLET BY MOUTH EVERY DAY, Disp: 90 tablet, Rfl: 0 ?  vitamin B-12 (CYANOCOBALAMIN) 1000 MCG tablet, Take 1 tablet (1,000 mcg total) by mouth daily., Disp: 1 tablet, Rfl: 0 ? ?Allergies  ?Allergen Reactions  ? Trazodone And Nefazodone Nausea Only  ? Mysoline [Primidone] Nausea Only  ?  Nausea and achey  ? ? ? ? ?   ?Objective:  ?Physical Exam  ?General: AAO x3, NAD ? ?Dermatological: Skin is warm, dry and supple bilateral. There are no open sores, no preulcerative lesions, no rash or signs of infection present. ? ?Vascular: Dorsalis Pedis artery and Posterior Tibial artery pedal pulses are 2/4 bilateral with immedate capillary fill time.  There is no pain with calf compression, swelling, warmth, erythema.  ? ?Neruologic: Sensation intact with Semmes Weinstein monofilament.  Vibratory  sensation intact. ? ?Musculoskeletal: Not able to elicit any area of pinpoint tenderness.  There is no edema.  Flexor, extensor tendons appear to be intact.  Muscular strength 5/5 in all groups tested bilateral. ? ?Gait: Unassisted, Nonantalgic.  ? ? ?   ?Assessment:  ? 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 ?   ?Plan:  ?-Treatment options discussed including all alternatives, risks, and complications ?-Etiology of symptoms were discussed ?-Follow-up on x-rays today as there is no pain to the feet. ?-Discussed possible neuropathy.  She did have a lower B12 but this has improved.  She is not having significant burning or tingling or other symptoms associated with the numbness I do not  think that gabapentin other medications will be beneficial for her.  Will discuss with neurology.  She may need to be reevaluated by neurology given the numbness in her legs and feet. ? ?Return if symptoms worsen or fail to improve. ? ?Vivi Barrack DPM ? ?Cc: Dr. [name] ?   ? ?

## 2022-02-08 ENCOUNTER — Other Ambulatory Visit: Payer: Self-pay | Admitting: Neurology

## 2022-02-08 DIAGNOSIS — R296 Repeated falls: Secondary | ICD-10-CM

## 2022-02-08 DIAGNOSIS — R413 Other amnesia: Secondary | ICD-10-CM

## 2022-02-08 DIAGNOSIS — I951 Orthostatic hypotension: Secondary | ICD-10-CM

## 2022-02-08 DIAGNOSIS — R42 Dizziness and giddiness: Secondary | ICD-10-CM

## 2022-03-14 DIAGNOSIS — H25011 Cortical age-related cataract, right eye: Secondary | ICD-10-CM | POA: Diagnosis not present

## 2022-03-14 DIAGNOSIS — H524 Presbyopia: Secondary | ICD-10-CM | POA: Diagnosis not present

## 2022-03-14 DIAGNOSIS — H2511 Age-related nuclear cataract, right eye: Secondary | ICD-10-CM | POA: Diagnosis not present

## 2022-03-14 DIAGNOSIS — H43813 Vitreous degeneration, bilateral: Secondary | ICD-10-CM | POA: Diagnosis not present

## 2022-04-05 ENCOUNTER — Other Ambulatory Visit: Payer: Self-pay | Admitting: Family Medicine

## 2022-04-05 DIAGNOSIS — E782 Mixed hyperlipidemia: Secondary | ICD-10-CM

## 2022-04-12 ENCOUNTER — Other Ambulatory Visit: Payer: Self-pay | Admitting: Family Medicine

## 2022-04-12 DIAGNOSIS — F419 Anxiety disorder, unspecified: Secondary | ICD-10-CM

## 2022-04-12 DIAGNOSIS — F325 Major depressive disorder, single episode, in full remission: Secondary | ICD-10-CM

## 2022-05-11 NOTE — Progress Notes (Addendum)
[name] [address] [place]. [place] [address] [phone]       OFFICE FOLLOW UP NOTE  Lindsey Frank Date of Birth:  1946/07/28 Medical Record Number:  [id number]    Primary neurologist: Dr. Rexene Alberts Reason for visit: Memory loss    SUBJECTIVE:   CHIEF COMPLAINT:  Chief Complaint  Patient presents with   Follow-up    RM 3 alone Pt is well, states she is still having trouble sleeping, tremor has worsen and memory is about the same.     HPI:   Lindsey Frank is a very pleasant 76 year old right-handed woman with an underlying medical history of reflux disease, hyperlipidemia, depression, essential tremor, history of endometriosis and borderline overweight state, who presents for follow-up visit of her Balance problem, recurrent falls and memory loss.  The patient is unaccompanied today.  She was last seen by Dr. Rexene Alberts on 11/14/2021 with memory stable.  Previously started on Aricept by Dr. Rexene Alberts but was d/c'd after about 1 month for unclear reason.  Aricept was restarted at prior visit.   Update 05/15/2022 JM:   She comes in to office today with a list of concerns:   -Reports issues with sleeping, will try tylenol PM without much benefit. Reports she spoke with PCP about getting something to help her sleep but was not prescribed anything for this. Per review of PCP note, has trialed trazodone in the past but unable to tolerate.  Discussed occasional use of Tylenol PM only as needed and importance of good sleep hygiene.  -Persistent paresthesias in BLE, has been present over the past 9 months, onset after a fall 07/2021 and dx'd with concussion, was seen by podiatry Dr. Jacqualyn Posey 4/10 for these complaints and felt more likely due to neuropathy, no testing or treatment recommended at that time.  Reports numbness in toes and outer edge of foot and at times arch of foot, can radiate mid way to shin.  Denies any painful symptoms such as burning pin/needles or  electrical shocks. Unable to appreciate any specific trigger such as prolonged ambulation, different shoes or time when laying in bed. When feet dorsiflex, feels tight pull/band sensation on top of foot.  Denies any associated weakness.  Does have low back pain chronically, does not radiate in to legs. Denies knee and hip pain. Does have b12 deficiency currently on supplementation with that.  Does have pre-DM with prior A1c 5.8 (11/2021). No hx of thyroid disorder. Does report nightly use of liquor about 2 shots worth per night. Denies current tobacco use although strong odor of tobacco on clothing at todays visit.   -Worsening essential tremor, feels this is due to stopping clonazepam which was stopped just about 1 year ago. Reports occasional difficulty holding objects but otherwise does not significantly interfere with functioning.  Previously tried propranolol, primidone and nadolol  -continues to struggle with gait and balance. This has not specifically worsened since prior visit  -Believes her memory has been stable since prior visit.  MMSE today 30/30 (prior 26/30). Tried to take Aricept but was experiencing acting out dreams and even fell out of her bed. These symptoms subsided after stopping.       History provided from Dr. Guadelupe Sabin prior office note for reference purposes only Today, 11/14/2021: She reports that she no longer takes Aricept.  She stopped it after about a month.  She reports that she was told not to take it any longer and it was on her list of medicines that  she should not take but is not sure who told her not to take it.  She recalls no side effects from it.  She currently only takes Crestor and sertraline.  She feels that her memory is stable but she is forgetful.  She is no longer on clonazepam or propranolol.  She reports that she recently fell out of bed, she has had some vivid dreams.  She did not injure herself thankfully but bumped her right knee which had a bruise. She  drinks alcohol daily in the evening in the form of liquor.  She drinks about 2 bottles of water per day.  She smokes about 3 to 4 cigarettes/day.      I evaluated her on 05/23/2021, at which time she was found to have orthostatic hypotension.  She had a nonspecific gait instability and we talked about her fall risk.  She was advised to proceed with a brain MRI as well as carotid Doppler ultrasound.  She was advised to talk to her prescriber regarding her beta-blocker as she had mild bradycardia.  She was also advised to talk about her blood pressure management.  She was advised that clonazepam was a contributor to balance problems and cognitive sluggishness.   She had a brain MRI with and without contrast on 06/07/2021 I reviewed the results:   IMPRESSION:    Abnormal MRI brain (with and without) demonstrating: - Mild atrophy and ventriculomegaly on ex vacuo basis. - Mild chronic small vessel ischemic disease.  - No acute findings.   She had a carotid Doppler ultrasound on 06/09/2021 and I reviewed the results: Bilateral ICA had 1 to 39% stenosis.  Bilateral vertebral arteries demonstrated antegrade flow.  The patient was notified of her test results.  She had a recent head CT without contrast on 07/25/2021 through the emergency room with indication of headache, intracranial hemorrhage suspected, headache after fall, concern for delayed bleed.  Decreased mobility, bilateral lower extremity weakness, constipation.  I reviewed the results:  IMPRESSION: No evidence of acute intracranial abnormality.   Generalized cerebral atrophy. Mild chronic small vessel ischemic changes within the cerebral white matter.   Tiny mucous retention cyst within the posterior right ethmoid air cells.   Small right mastoid effusion.   She had a head CT without contrast as well as cervical spine CT without contrast and maxillofacial CT without contrast on 07/16/2021 after a fall and I reviewed the  results: IMPRESSION: 1. Mild cerebral atrophy and microvascular disease changes of the supratentorial brain. 2. No acute intracranial abnormality.   IMPRESSION: 1. No acute cervical spine fracture or subluxation. 2. Multilevel degenerative changes, most notably in the form of multilevel facet joint hypertrophy. IMPRESSION: Negative for acute fracture or dislocation of the facial bones.       05/23/21 Dr. Rexene Alberts: (She) reports difficulty with her balance.  She has had some falls.  She reports intermittent vertigo type symptoms.  She feels that she has positional vertigo off and on since she fell and hit her head several weeks ago.  She had a fall in March 2022 at the grocery store per her friend.  She reports that her shopping cart was pushed out of the way and she was actually leaning on the cart and that is why she fell.  She has had intermittent weakness affecting her legs but no sudden onset of one-sided weakness or numbness or tingling or droopy face or slurring of speech.  Lindsey Frank is also worried about her memory loss which has been  ongoing for the past few months.  Patient is not concerned about memory issues but does report word finding difficulty and losing her train of thought, some forgetfulness.  She has not driven a car in the past several weeks.  She is not aware of any family history of memory loss or dementia.  She has been using a 4 wheeled walker for the past week or so.  Prior to that she used a 2 wheeled walker and a 4 pronged cane.  She quit smoking about a month ago and has been on a nicotine patch.  She drinks alcohol rarely.  She drinks caffeine in the form of coffee, 1 cup/day.  She does not drink a whole lot of water, estimates that she drinks about 3-4 8 ounce bottles of water per day.  She likes to drink caffeine free diet soda. I reviewed your office note from 05/18/2021.  She had a recent head CT without contrast on 05/11/2021 after a fall.  I reviewed the results: Impression:  No acute or traumatic finding.  Age related atrophy.  She recently into [facility] assisted [facility].  She has fallen several times, unclear how many times, no injuries thankfully, no loss of consciousness. She has felt lightheaded upon standing.  She feels off balance when she suddenly stands up.  She has had intermittent symptoms of spinning sensation especially when she turns in bed.  She has not seen ENT.   Of note, she is on long-acting Inderal 80 mg once daily and she has been on clonazepam.  She is currently on 1 mg strength twice daily.  She has been on clonazepam for years.   I had seen her several years ago for essential tremor.  Prior to that she had seen Dr. Erling Cruz. She has had no recent blood work in the past couple of months with the exception of A1c on 03/24/2021, which was 5.7.       07/14/14 Dr. Rexene Alberts: 76 year old right-handed woman with an underlying medical history of reflux disease, migraine headaches, depression, hyperlipidemia, status post appendectomy, tonsillectomy and adenoidectomy, as well as laparoscopic surgery for endometriosis, who presents for followup consultation of her essential tremor. She is unaccompanied today. I first met her on 01/08/2014 at the request of her primary care physician, at which time she reported a long-standing history of essential tremor. She used to see Dr. Felecia Shelling in [facility] in the past, but had not seen any neurologist in years. At the time of her first visit with me I suggested changing her nadolol to Inderal LA 80 mg once daily. I did keep her on her clonazepam, but suggested we consider tapering the clonazepam in the long run.   Today, she reports that she could not tolerate propranolol. She went back to nadolol and continues with clonazepam.   She was stable with with clonazepam and nadolol for years. She retired in 2011. She also saw Dr. Erling Cruz in or around 2011 and was tested for MS, but was told she did not have MS. She could not tolerate mysoline,  which made her sick. Nadolol is expensive for. She has no FHx of ET, but there is a FHx of dementia and possibly PD. Her symptoms started in the 90s and she had a head tremor and bilateral hand tremors, and she feels she has progressed. She takes nadolol 40 mg in AM and 80 mg at night. Dr. Erling Cruz encouraged her to come off of clonazepam. She tried other meds, but no gabapentin or topamax.  She has not been on propranolol. She rarely drinks alcohol. She smokes about 1/2 ppd. She is trying to quit smoking.          ROS:   14 system review of systems performed and negative with exception of those listed in HPI  PMH:  Past Medical History:  Diagnosis Date   Abnormal Pap smear of cervix 2019   HR HPV + recurrent    Depression    Elevated C-reactive protein (CRP) 01/2005   Elevated cholesterol 01/2005   Elevated triglycerides with high cholesterol 01/2005   GERD (gastroesophageal reflux disease)    History of endometriosis    Migraine    Tremor, essential     PSH:  Past Surgical History:  Procedure Laterality Date   BREAST LUMPECTOMY Right 1992   small lumpectomy for benign tumor   CATARACT EXTRACTION Left 01/02/2018   Dr. 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)    Employer: [employer]  Tobacco Use   Smoking status: Former    Packs/day: 0.25    Years: 30.00    Total pack years: 7.50    Types: Cigarettes   Smokeless tobacco: Never   Tobacco comments:    quit 12/2014, relapsed with stress, quit again end of July 2016  Pt states  she is on the patch, quit again 09/2021  Vaping Use   Vaping Use: Never used  Substance and Sexual Activity   Alcohol use: Yes    Alcohol/week: 0.0 - 1.0 standard drinks of alcohol    Comment: Amelia Jo 2x/week   Drug use: No   Sexual activity: Not Currently  Other Topics Concern   Not on file  Social History Narrative   Lives alone.  Son lives in [place], 4 grandchildren   College Education   Right handed   Caffeine one cup daily (tea with milk).   Social Determinants of Health   Financial Resource Strain: Not on file  Food Insecurity: No Food Insecurity (07/22/2021)   Hunger Vital Sign    Worried About Running Out of Food in the Last Year: Never true    Ran Out of Food in the Last Year: Never true  Transportation Needs: No Transportation Needs (07/22/2021)   PRAPARE - Hydrologist (Medical): No    Lack of Transportation (Non-Medical): No  Physical Activity: Not on file  Stress: Not on file  Social Connections: Not on file  Intimate Partner Violence: Not on file    Family History:  Family History  Problem Relation Age of Onset   Diabetes Mother    Heart disease Father 59   Stroke Father 6   Throat cancer Father    Depression Brother    Stomach cancer Maternal Aunt    Depression Maternal Uncle        suicide   Alzheimer's disease Cousin    Neuropathy Neg Hx     Medications:   Current Outpatient Medications on  File Prior to Visit  Medication Sig Dispense Refill   Calcium Carbonate-Vitamin D 600-200 MG-UNIT TABS Take 600 mg by mouth 2 (two) times daily.     diphenhydramine-acetaminophen (TYLENOL PM) 25-500 MG TABS tablet Take 2 tablets by mouth at bedtime as needed (sleep).     omeprazole (PRILOSEC) 20 MG capsule Take 20 mg by mouth daily.     rosuvastatin (CRESTOR) 40 MG tablet TAKE 1 TABLET BY MOUTH EVERY DAY 90 tablet 0   sertraline (ZOLOFT) 50 MG tablet TAKE 1 TABLET BY MOUTH EVERY DAY 90 tablet 0   vitamin B-12 (CYANOCOBALAMIN)  1000 MCG tablet Take 1 tablet (1,000 mcg total) by mouth daily. 1 tablet 0   No current facility-administered medications on file prior to visit.    Allergies:   Allergies  Allergen Reactions   Trazodone And Nefazodone Nausea Only   Mysoline [Primidone] Nausea Only    Nausea and achey      OBJECTIVE:  Physical Exam  Vitals:   05/15/22 1025  BP: 118/77  Pulse: 67  Weight: 136 lb (61.7 kg)  Height: 5' 4"  (1.626 m)   Body mass index is 23.34 kg/m. No results found.   General: well developed, well nourished, very pleasant elderly Caucasian female, seated, in no evident distress Head: head normocephalic and atraumatic.   Neck: supple with no carotid or supraclavicular bruits Cardiovascular: regular rate and rhythm, no murmurs Musculoskeletal: no deformity Skin:  no rash/petichiae Vascular:  Normal pulses all extremities   Neurologic Exam Mental Status: Awake and fully alert. Fluent speech and language. Oriented to place and time. Recent and remote memory intact. Attention span, concentration and fund of knowledge appropriate. Mood and affect appropriate.     05/15/2022   10:32 AM 11/14/2021    8:58 AM 05/23/2021    9:05 AM  MMSE - Mini Mental State Exam  Orientation to time 5 4 2   Orientation to Place 5 5 5   Registration 3 3 3   Attention/ Calculation 5 5 2   Recall 3 2 1   Language- name 2 objects 2 2 2   Language- repeat 1 1 1   Language- follow 3 step command 3 3 2   Language- read & follow direction 1 0 1  Write a sentence 1 0 1  Copy design 1 1 0  Total score 30 26 20    Cranial Nerves: Pupils equal, briskly reactive to light. Extraocular movements full without nystagmus. Visual fields full to confrontation. Hearing intact. Facial sensation intact. Face, tongue, palate moves normally and symmetrically.  Motor: Normal bulk and tone. Normal strength in all tested extremity muscles.  Slight postural tremor but no significant action tremor or intention tremor.  No  evidence of resting tremor.  Intermittent head tremor noted. Sensory.: intact to touch , pinprick , position and vibratory sensation BUE. Decreased pin prick sensation inner foot and dorsal foot bilaterally, slightly impaired pin prick sensation L>R over outer shin, intact to light touch, vibratory and position BLE Coordination: Rapid alternating movements normal in all extremities. Finger-to-nose and heel-to-shin performed accurately bilaterally. Gait and Station: Arises from chair without difficulty. Stance is slightly hunched. Gait demonstrates slow cautious steps with decreased stride length and step height bilaterally but no specific shuffling noted. Mild imbalance greater with turns. Not using AD.  Did not attempt heel toe or tandem walk Reflexes: 1+ and symmetric. Toes downgoing.         ASSESSMENT/PLAN: Lindsey Frank is a 75 y.o. year old female    Memory loss  -  Stable with MMSE 30/30  -Intolerant to Aricept   -Discussed importance of routine memory exercises such as crossword puzzles, word search, sudoku, card games and reading as well as discussed importance of routine exercise, good sleep hygiene and healthy diet  -Discussed limiting EtOH use as well as importance of tobacco cessation   BLE sensory impairment Gait impairment/imbalance    -present over past 9 months, fluctuation of numbness along distal L4, L5 and S1 nerve root (toes, side and dorsum of feet and radiating up outer mid shin). Denies any painful symptoms.   -referral placed to neuro PT as this could be contributing to gait/balance difficulties  -will further discuss workup with Dr. Rexene Alberts including EMG/NCV to rule out neuropathy or spinal imaging   -CTH at time of unset unremarkable, do not feel MRI brain is warranted at this time  -Discussed repeat lab work including A1c, B12 and TSH but patient wishes to obtain with PCP at follow-up visit next week  -Discussed limiting EtOH use as current use of 2 shots of  liquor nightly   Essential tremor  -Intolerant to prior medications including propranolol, nadolol and primidone   -does not significantly interfer with daily activity or functioning  -referral placed to neuro PT  -she questions restarting clonazepam - will further discuss this with Dr. Rexene Alberts but concerned that potential side effects may outweigh any type of benefit especially with mild symptoms and unfortunately we are limited on other treatment options due to prior med intolerance, hx of orthostatic hypotension with dizziness/vertigo and is already at high fall risk   Insomnia  -Advised to discuss with PCP at follow-up visit next week      Follow up in 6 months or call earlier if needed   CC:  PCP: Rita Ohara, MD    I spent 48 minutes of face-to-face and non-face-to-face time with patient.  This included previsit chart review, lab review, study review, order entry, electronic health record documentation, patient education regarding above diagnoses with possible future treatment plan and current plan and possible etiologies, review and discussion of MMSE and memory loss concerns and answered all other questions to patient satisfaction   Frann Rider, [facility]  [facility] [address] [place], [place] [address]  Phone [phone] Fax [phone] Note: This document was prepared with digital dictation and possible smart phrase technology. Any transcriptional errors that result from this process are unintentional.    I reviewed the above note and documentation by the Nurse Practitioner and agree with the history, exam, assessment and plan as outlined above, with the following additional recommendations: I would recommend evaluation with a spine specialist, I would favor that she discuss this first with her primary care for a potential referral.  I would not favor restarting clonazepam for fear of side effects especially since she has fallen and  she is at high risk for side effects, especially in light of alcohol consumption. I was available for consultation.  Star Age, MD, PhD [name])

## 2022-05-15 ENCOUNTER — Encounter: Payer: Self-pay | Admitting: Adult Health

## 2022-05-15 ENCOUNTER — Ambulatory Visit: Payer: Medicare HMO | Admitting: Adult Health

## 2022-05-15 VITALS — BP 118/77 | HR 67 | Ht 64.0 in | Wt 136.0 lb

## 2022-05-15 DIAGNOSIS — G3184 Mild cognitive impairment, so stated: Secondary | ICD-10-CM

## 2022-05-15 DIAGNOSIS — G8929 Other chronic pain: Secondary | ICD-10-CM | POA: Diagnosis not present

## 2022-05-15 DIAGNOSIS — G250 Essential tremor: Secondary | ICD-10-CM | POA: Diagnosis not present

## 2022-05-15 DIAGNOSIS — M545 Low back pain, unspecified: Secondary | ICD-10-CM

## 2022-05-15 DIAGNOSIS — R2689 Other abnormalities of gait and mobility: Secondary | ICD-10-CM

## 2022-05-15 DIAGNOSIS — R448 Other symptoms and signs involving general sensations and perceptions: Secondary | ICD-10-CM

## 2022-05-15 NOTE — Patient Instructions (Addendum)
Order placed to start physical therapy at [facility] for neuropathy symptoms, balance difficulties and tremors  - you will be contact to schedule but if you dont hear from them by next week, please call them to schedule  Will further discuss further work up with different testing and imaging with Dr. Frances Furbish prior to proceeding. Will also discuss what she would recommend for tremors - you can also try weighted utensil or pens for your tremor  Please follow up with your PCP as scheduled for repeat blood work and discuss issues with insomnia  Avoid nightly use of alcohol as this can contribute to your numbness sensation as well as memory issues     Follow up with Dr. Frances Furbish in 6 months or call earlier if needed

## 2022-05-16 NOTE — Progress Notes (Signed)
Can you please advise patient of the following recommendations from Dr. Frances Furbish? Thank you!

## 2022-05-22 ENCOUNTER — Telehealth: Payer: Self-pay | Admitting: Family Medicine

## 2022-05-22 NOTE — Telephone Encounter (Signed)
No.  Per CPE documentation, just to have A1c  at her visit

## 2022-05-22 NOTE — Telephone Encounter (Signed)
I called pt & rescheduled her med check because she had rescheduled via mychart to a 15 min spot and she wanted to know if she needs to be fasting? Please advise

## 2022-05-25 ENCOUNTER — Encounter: Payer: Medicare HMO | Admitting: Family Medicine

## 2022-05-26 NOTE — Telephone Encounter (Signed)
Appt made

## 2022-05-29 NOTE — Therapy (Signed)
OUTPATIENT PHYSICAL THERAPY NEURO EVALUATION   Patient Name: Lindsey Frank MRN: [id number] DOB:01/14/1946, 76 y.o., female Today's Date: 05/30/2022   PCP: [name], MD REFERRING PROVIDER: Ihor Austin, NP   PT End of Session - 05/30/22 1537     Visit Number 1    Number of Visits 13    Date for PT Re-Evaluation 07/11/22    Authorization Type [employer] Medicare (10th Visit PN)    PT Start Time 1405    PT Stop Time 1448    PT Time Calculation (min) 43 min    Equipment Utilized During Treatment Gait belt    Activity Tolerance Patient tolerated treatment well    Behavior During Therapy WFL for tasks assessed/performed             Past Medical History:  Diagnosis Date   Abnormal Pap smear of cervix 2019   HR HPV + recurrent    Depression    Elevated C-reactive protein (CRP) 01/2005   Elevated cholesterol 01/2005   Elevated triglycerides with high cholesterol 01/2005   GERD (gastroesophageal reflux disease)    History of endometriosis    Migraine    Tremor, essential    Past Surgical History:  Procedure Laterality Date   BREAST LUMPECTOMY Right 1992   small lumpectomy for benign tumor   CATARACT EXTRACTION Left 01/02/2018   Dr. Dagoberto Ligas   COSMETIC SURGERY  2008   neck lift   LAPAROSCOPY  1981   endometriosis   TONSILECTOMY, ADENOIDECTOMY, BILATERAL MYRINGOTOMY AND TUBES  1953   age 46   UTERINE FIBROID SURGERY  1977   with appendectomy   Patient Active Problem List   Diagnosis Date Noted   Impaired fasting glucose 07/04/2016   Depression, major, in remission (HCC) 11/17/2013   GERD (gastroesophageal reflux disease) 05/14/2013   Tobacco use disorder 04/24/2012   Mixed hyperlipidemia 10/26/2011   Pure hyperglyceridemia 03/30/2011   Depressive disorder, not elsewhere classified 03/30/2011   Benign essential tremor 02/27/2011   Pure hypercholesterolemia 02/27/2011   Migraine     ONSET DATE: August 2022  REFERRING DIAG:  R44.8 (ICD-10-CM) - Sensory  impairment R26.89 (ICD-10-CM) - Abnormality of gait due to impairment of balance G25.0 (ICD-10-CM) - Essential tremor  THERAPY DIAG:  Unsteadiness on feet  Chronic bilateral low back pain without sciatica  Other abnormalities of gait and mobility  Cramp and spasm  Rationale for Evaluation and Treatment Rehabilitation  SUBJECTIVE:  SUBJECTIVE STATEMENT: Patient reports that she started to experience N/T in B feet up into the ankles which started in August 2022. N/T is worst over the lateral aspect of feet, which causes her to lose balance if she steps on the outside of the foot. Reports that she had a concussion which is why she went into assisted living for a month in 2022. Had been using a RW at that time but no longer using AD. Also has a constant back ache, especially when standing up and bending over such as to garden. Denies dizziness since her concussion but reports some changes in memory and balance. Has not had speech therapy but reports interest in it. Reports "grabbing" sensation over top of B feet.  Pt accompanied by: self  PERTINENT HISTORY: depression, HLD, GERD, migraine, tremor, R lumpectomy, MCI  PAIN:  Are you having pain? No  PRECAUTIONS: Fall  WEIGHT BEARING RESTRICTIONS No  FALLS: Has patient fallen in last 6 months? No  LIVING ENVIRONMENT: Lives with: lives with their family Lives in: House/apartment Stairs: No; has a steep driveway, which contributed to previous fall Has following equipment at home: Single point cane, Environmental consultant - 2 wheeled, Tour manager, and Grab bars  PLOF: Independent; has a cleaning service 1x/week  PATIENT GOALS  "get this numbness gone and the back pain"  OBJECTIVE:   DIAGNOSTIC FINDINGS: none recent  COGNITION: Overall cognitive status: No  family/caregiver present to determine baseline cognitive functioning   SENSATION: WFL; c/o L side slightly decreased sensation  COORDINATION: Intact B alt pronation/supination and alt toe tap   POSTURE: rounded shoulders, forward head, and increased thoracic kyphosis  LOWER EXTREMITY ROM:     Active  Right Eval Left Eval  Hip flexion    Hip extension    Hip abduction    Hip adduction    Hip internal rotation    Hip external rotation    Knee flexion    Knee extension    Ankle dorsiflexion 10 10  Ankle plantarflexion    Ankle inversion    Ankle eversion     (Blank rows = not tested)  LOWER EXTREMITY MMT:    MMT Right Eval Left Eval  Hip flexion 4+ 5  Hip extension    Hip abduction 4 3+  Hip adduction 4 3+  Hip internal rotation    Hip external rotation    Knee flexion 5 5  Knee extension 5 4+  Ankle dorsiflexion    Ankle plantarflexion 4 (5 reps with compensations) 4 (3 reps with compensations)  Ankle inversion 4+ 4  Ankle eversion 4 4+  (Blank rows = not tested)   GAIT: Gait pattern:  foot flat at  heel strike with B short step length, trunk and neck flexed throughout Assistive device utilized: None    [facility] PT Assessment - 05/30/22 0001       Standardized Balance Assessment   Standardized Balance Assessment Five Times Sit to Stand;Timed Up and Go Test;Dynamic Gait Index    Five times sit to stand comments  13.6   without UEs; posterior sway     Dynamic Gait Index   Level Surface Mild Impairment    Change in Gait Speed Moderate Impairment    Gait with Horizontal Head Turns Moderate Impairment    Gait with Vertical Head Turns Mild Impairment    Gait and Pivot Turn Mild Impairment    Step Over Obstacle Mild Impairment    Step Around Obstacles Normal    Steps Mild  Impairment    Total Score 15      Timed Up and Go Test   TUG Normal TUG    Normal TUG (seconds) 12.15   without AD              PATIENT EDUCATION: Education details:  prognosis, POC, HEP Person educated: Patient Education method: Explanation, Actor cues, Verbal cues, and Handouts Education comprehension: verbalized understanding   HOME EXERCISE PROGRAM: Access Code: [email or web address] URL: [email or web address] Date: 05/30/2022 Prepared by: [facility] - Outpatient  Rehab - [facility]  Exercises - Gastroc Stretch on Wall  - 1 x daily - 5 x weekly - 2 sets - 10 reps - 30 sec hold - Standing Anterior Tibialis Stretch  - 1 x daily - 5 x weekly - 2 sets - 10 reps - 30 sec hold - Heel Toe Raises with Counter Support  - 1 x daily - 5 x weekly - 2 sets - 10 reps - Standing Toe Taps  - 1 x daily - 5 x weekly - 2 sets - 10 reps - Narrow Stance with Counter Support  - 1 x daily - 5 x weekly - 2 sets - 30 sec hold    GOALS: Goals reviewed with patient? Yes  SHORT TERM GOALS: Target date: 06/20/2022  Patient to be independent with initial HEP. Baseline: HEP initiated Goal status: INITIAL    LONG TERM GOALS: Target date: 07/11/2022  Patient to be independent with advanced HEP. Baseline: Not yet initiated  Goal status: INITIAL  Patient to demonstrate B LE strength >/=4+/5.  Baseline: See above Goal status: INITIAL  Patient to demonstrate B ankle dorsiflexion AROM to atleast 14 degrees to improve gait mechanics. Baseline: 10 deg B Goal status: INITIAL  Patient to demonstrate alternating reciprocal pattern when ascending and descending stairs with good stability and 1 handrail as needed.   Baseline: Unable Goal status: INITIAL  Patient to score at least 20/24 on DGI in order to decrease risk of falls.  Baseline: 15/24 Goal status: INITIAL  Patient to demonstrate safe ambulation with LRAD on outdoor surfaces such as sidewalk, hills, and grass with good stability. Baseline: NT Goal status: INITIAL  Patient to demonstrate safe kneeling/bending to simulate gardening activities without back pain limiting.  Baseline: reports pain with  gardening  Goal status: INITIAL   ASSESSMENT:  CLINICAL IMPRESSION:  Patient is a 76 y/o F presenting to OPPT with c/o B LE N/T and imbalance s/p fall resulting in a concussion in 07/2021. Also reports chronic LBP and muscle spasms in B feet. Patient today presenting with c/o slight decreased sensation on L>R LE, decreased B ankle dorsiflexion AROM, marked L LE weakness, gait deviations. Patient's score on DGI indicates an increased risk of falls. Patient was educated on gentle stretching and balance HEP and reported understanding. Would benefit from skilled PT services 1-2 x/week for 6 weeks to address aforementioned impairments in order to optimize level of function.     OBJECTIVE IMPAIRMENTS Abnormal gait, decreased activity tolerance, decreased balance, decreased strength, increased muscle spasms, impaired flexibility, impaired sensation, improper body mechanics, postural dysfunction, and pain.   ACTIVITY LIMITATIONS carrying, lifting, bending, standing, squatting, stairs, transfers, bathing, toileting, dressing, reach over head, and hygiene/grooming  PARTICIPATION LIMITATIONS: meal prep, cleaning, laundry, driving, shopping, community activity, yard work, and church  PERSONAL FACTORS Age, Behavior pattern, Fitness, Past/current experiences, Time since onset of injury/illness/exacerbation, and 3+ comorbidities: depression, HLD, GERD, migraine, tremor, R lumpectomy, MCI  are also affecting patient's functional outcome.  REHAB POTENTIAL: Good  CLINICAL DECISION MAKING: Evolving/moderate complexity  EVALUATION COMPLEXITY: Moderate  PLAN: PT FREQUENCY: 1-2x/week  PT DURATION: 6 weeks  PLANNED INTERVENTIONS: Therapeutic exercises, Therapeutic activity, Neuromuscular re-education, Balance training, Gait training, Patient/Family education, Self Care, Joint mobilization, Stair training, Vestibular training, Canalith repositioning, Aquatic Therapy, Dry Needling, Electrical stimulation,  Cryotherapy, Moist heat, Taping, Manual therapy, and Re-evaluation  PLAN FOR NEXT SESSION: assess LBP and palpate B feet, reassess HEP, progress balance activities   Anette Guarneri, PT, DPT 05/30/22 3:54 PM  [facility] at [facility] [address], [address] [place], [place] [address] Phone # [phone] Fax # [phone]

## 2022-05-30 ENCOUNTER — Ambulatory Visit: Payer: Medicare HMO | Attending: Adult Health | Admitting: Physical Therapy

## 2022-05-30 ENCOUNTER — Encounter: Payer: Self-pay | Admitting: Physical Therapy

## 2022-05-30 ENCOUNTER — Telehealth: Payer: Self-pay | Admitting: Physical Therapy

## 2022-05-30 ENCOUNTER — Other Ambulatory Visit: Payer: Self-pay

## 2022-05-30 DIAGNOSIS — R2689 Other abnormalities of gait and mobility: Secondary | ICD-10-CM | POA: Insufficient documentation

## 2022-05-30 DIAGNOSIS — R448 Other symptoms and signs involving general sensations and perceptions: Secondary | ICD-10-CM | POA: Insufficient documentation

## 2022-05-30 DIAGNOSIS — M545 Low back pain, unspecified: Secondary | ICD-10-CM | POA: Diagnosis not present

## 2022-05-30 DIAGNOSIS — G250 Essential tremor: Secondary | ICD-10-CM | POA: Insufficient documentation

## 2022-05-30 DIAGNOSIS — R413 Other amnesia: Secondary | ICD-10-CM

## 2022-05-30 DIAGNOSIS — G8929 Other chronic pain: Secondary | ICD-10-CM | POA: Insufficient documentation

## 2022-05-30 DIAGNOSIS — R252 Cramp and spasm: Secondary | ICD-10-CM | POA: Insufficient documentation

## 2022-05-30 DIAGNOSIS — R2681 Unsteadiness on feet: Secondary | ICD-10-CM | POA: Insufficient documentation

## 2022-05-30 NOTE — Telephone Encounter (Signed)
Hello,  Lindsey Frank was evaluated in OPPT today per your referral for gait impairment. The patient would benefit from ST evaluation for difficulty with memory and cognition s/p concussion.    If you agree, please place an order in [email or web address] in [facility] or fax the order to [phone].  Thank you, Anette Guarneri, PT, DPT 05/30/22 3:59 PM  [facility], [facility] [address] [place], [place]  [address] Phone:  [phone] Fax:  [phone]

## 2022-05-31 NOTE — Telephone Encounter (Signed)
No problem!  Referral was placed for SLP evaluation.  Please let me know if anything else is needed. Thank you!

## 2022-05-31 NOTE — Therapy (Signed)
OUTPATIENT PHYSICAL THERAPY NEURO TREATMENT   Patient Name: Lindsey Frank MRN: [id number] DOB:February 06, 1946, 76 y.o., female Today's Date: 06/01/2022   PCP: [name], MD REFERRING PROVIDER: Ihor Austin, NP   PT End of Session - 06/01/22 1446     Visit Number 2    Number of Visits 13    Date for PT Re-Evaluation 07/11/22    Authorization Type [employer] Medicare (10th Visit PN)    PT Start Time 1403    PT Stop Time 1445    PT Time Calculation (min) 42 min    Equipment Utilized During Treatment Gait belt    Activity Tolerance Patient tolerated treatment well    Behavior During Therapy WFL for tasks assessed/performed              Past Medical History:  Diagnosis Date   Abnormal Pap smear of cervix 2019   HR HPV + recurrent    Depression    Elevated C-reactive protein (CRP) 01/2005   Elevated cholesterol 01/2005   Elevated triglycerides with high cholesterol 01/2005   GERD (gastroesophageal reflux disease)    History of endometriosis    Migraine    Tremor, essential    Past Surgical History:  Procedure Laterality Date   BREAST LUMPECTOMY Right 1992   small lumpectomy for benign tumor   CATARACT EXTRACTION Left 01/02/2018   Dr. Dagoberto Ligas   COSMETIC SURGERY  2008   neck lift   LAPAROSCOPY  1981   endometriosis   TONSILECTOMY, ADENOIDECTOMY, BILATERAL MYRINGOTOMY AND TUBES  1953   age 33   UTERINE FIBROID SURGERY  1977   with appendectomy   Patient Active Problem List   Diagnosis Date Noted   Impaired fasting glucose 07/04/2016   Depression, major, in remission (HCC) 11/17/2013   GERD (gastroesophageal reflux disease) 05/14/2013   Tobacco use disorder 04/24/2012   Mixed hyperlipidemia 10/26/2011   Pure hyperglyceridemia 03/30/2011   Depressive disorder, not elsewhere classified 03/30/2011   Benign essential tremor 02/27/2011   Pure hypercholesterolemia 02/27/2011   Migraine     ONSET DATE: August 2022  REFERRING DIAG:  R44.8 (ICD-10-CM) - Sensory  impairment R26.89 (ICD-10-CM) - Abnormality of gait due to impairment of balance G25.0 (ICD-10-CM) - Essential tremor  THERAPY DIAG:  Unsteadiness on feet  Chronic bilateral low back pain without sciatica  Other abnormalities of gait and mobility  Cramp and spasm  Rationale for Evaluation and Treatment Rehabilitation  SUBJECTIVE:  SUBJECTIVE STATEMENT: Doing okay- better than yesterday when the R top of the foot and back of the calf was hurting.  Pt accompanied by: self  PERTINENT HISTORY: depression, HLD, GERD, migraine, tremor, R lumpectomy, MCI  PAIN:  Are you having pain? No  PRECAUTIONS: Fall   PATIENT GOALS  "get this numbness gone and the back pain"  OBJECTIVE:      TODAY'S TREATMENT: 06/01/22 Activity Comments  Palpation of LB TTP in R medial periscapular region and L thoracic paraspinals, midline of lower lumbar spine/sacrum, B proximal glutes; very tight in B thoracic paraspinals d/t increased lordodic curve in upper lumbar/lower thoracic spine   Palpation of B feet  L toe resting in extension with visible great toe extensor tendon B; very TTP along B gastroc/soleus with multiple trigger points. L >R calcaneal eversion and pronation   Pelvic tilts sitting 10x  Cues to avoid strain/excessive movement   Red TB row 10x Cues to contract core, look straight ahead, retract scapulae  Red TB shoulder extension 10x Cues to contract core, look straight ahead, retract scapulae  Runner's stretch 30" heavy verbal cueing for best positioning and tolerance   PF stretch Using airex and heavy manual assistance for best positioning     Active ROM 06/01/22  Lumbar flexion Ankles   Lumbar extension 50% limited   Lumbar R SBing Proximal tibia  Lumbar L SBing Jt line  Lumbar R rotation  25%  limited   Lumbar L rotation normal  *c/o no LBP, but some "nagging pain in upper back"    HOME EXERCISE PROGRAM Last updated: 06/01/22 Access Code: [email or web address] URL: [email or web address] Date: 06/01/2022 Prepared by: [facility] - Outpatient  Rehab - [facility]  Exercises - Gastroc Stretch on Wall  - 1 x daily - 5 x weekly - 2 sets - 10 reps - 30 sec hold - Standing Anterior Tibialis Stretch  - 1 x daily - 5 x weekly - 2 sets - 10 reps - 30 sec hold - Heel Toe Raises with Counter Support  - 1 x daily - 5 x weekly - 2 sets - 10 reps - Standing Toe Taps  - 1 x daily - 5 x weekly - 2 sets - 10 reps - Narrow Stance with Counter Support  - 1 x daily - 5 x weekly - 2 sets - 30 sec hold - Seated Pelvic Tilt  - 1 x daily - 5 x weekly - 2 sets - 10 reps - Standing Shoulder Row with Anchored Resistance  - 1 x daily - 5 x weekly - 2 sets - 10 reps - Shoulder Extension with Resistance - Palms Forward  - 1 x daily - 5 x weekly - 2 sets - 10 reps   PATIENT EDUCATION: Education details: HEP update; review of today's findings Person educated: Patient Education method: Explanation, Demonstration, Tactile cues, Verbal cues, and Handouts Education comprehension: verbalized understanding and returned demonstration   Below measures were taken at time of initial evaluation unless otherwise specified:  DIAGNOSTIC FINDINGS: none recent  COGNITION: Overall cognitive status: No family/caregiver present to determine baseline cognitive functioning   SENSATION: WFL; c/o L side slightly decreased sensation  COORDINATION: Intact B alt pronation/supination and alt toe tap   POSTURE: rounded shoulders, forward head, and increased thoracic kyphosis  LOWER EXTREMITY ROM:     Active  Right Eval Left Eval  Hip flexion    Hip extension    Hip abduction    Hip adduction  Hip internal rotation    Hip external rotation    Knee flexion    Knee extension    Ankle dorsiflexion 10 10   Ankle plantarflexion    Ankle inversion    Ankle eversion     (Blank rows = not tested)  LOWER EXTREMITY MMT:    MMT Right Eval Left Eval  Hip flexion 4+ 5  Hip extension    Hip abduction 4 3+  Hip adduction 4 3+  Hip internal rotation    Hip external rotation    Knee flexion 5 5  Knee extension 5 4+  Ankle dorsiflexion    Ankle plantarflexion 4 (5 reps with compensations) 4 (3 reps with compensations)  Ankle inversion 4+ 4  Ankle eversion 4 4+  (Blank rows = not tested)   GAIT: Gait pattern:  foot flat at  heel strike with B short step length, trunk and neck flexed throughout Assistive device utilized: None        PATIENT EDUCATION: Education details: prognosis, POC, HEP Person educated: Patient Education method: Explanation, Actor cues, Verbal cues, and Handouts Education comprehension: verbalized understanding   HOME EXERCISE PROGRAM: Access Code: [email or web address] URL: [email or web address] Date: 05/30/2022 Prepared by: [facility] - Outpatient  Rehab - [facility]  Exercises - Gastroc Stretch on Wall  - 1 x daily - 5 x weekly - 2 sets - 10 reps - 30 sec hold - Standing Anterior Tibialis Stretch  - 1 x daily - 5 x weekly - 2 sets - 10 reps - 30 sec hold - Heel Toe Raises with Counter Support  - 1 x daily - 5 x weekly - 2 sets - 10 reps - Standing Toe Taps  - 1 x daily - 5 x weekly - 2 sets - 10 reps - Narrow Stance with Counter Support  - 1 x daily - 5 x weekly - 2 sets - 30 sec hold    GOALS: Goals reviewed with patient? Yes  SHORT TERM GOALS: Target date: 06/20/2022  Patient to be independent with initial HEP. Baseline: HEP initiated Goal status: IN PROGRESS    LONG TERM GOALS: Target date: 07/11/2022  Patient to be independent with advanced HEP. Baseline: Not yet initiated  Goal status: IN PROGRESS  Patient to demonstrate B LE strength >/=4+/5.  Baseline: See above Goal status: IN PROGRESS  Patient to demonstrate B ankle  dorsiflexion AROM to atleast 14 degrees to improve gait mechanics. Baseline: 10 deg B Goal status: IN PROGRESS  Patient to demonstrate alternating reciprocal pattern when ascending and descending stairs with good stability and 1 handrail as needed.   Baseline: Unable Goal status: IN PROGRESS  Patient to score at least 20/24 on DGI in order to decrease risk of falls.  Baseline: 15/24 Goal status: IN PROGRESS  Patient to demonstrate safe ambulation with LRAD on outdoor surfaces such as sidewalk, hills, and grass with good stability. Baseline: NT Goal status: IN PROGRESS  Patient to demonstrate safe kneeling/bending to simulate gardening activities without back pain limiting.  Baseline: reports pain with gardening  Goal status: IN PROGRESS   ASSESSMENT:  CLINICAL IMPRESSION: Patient arrived to session with report of some R foot/calf pain yesterday which has since resolved. Lumbar AROM was assessed which prompted some thoracic back pain. Patient with prominent thoracic kyphosis and lumbar lordosis, likely contributing to pain. Also with very painful trigger points in the calves contributing to muscle spasms in the feet. Postural strengthening activities required cues for sufficient scapular retraction and neutral  head position. Reviewed ankle stretching with assistance for positioning. Patient reported understanding of all edu provided today and without complaints upon leaving.     OBJECTIVE IMPAIRMENTS Abnormal gait, decreased activity tolerance, decreased balance, decreased strength, increased muscle spasms, impaired flexibility, impaired sensation, improper body mechanics, postural dysfunction, and pain.   ACTIVITY LIMITATIONS carrying, lifting, bending, standing, squatting, stairs, transfers, bathing, toileting, dressing, reach over head, and hygiene/grooming  PARTICIPATION LIMITATIONS: meal prep, cleaning, laundry, driving, shopping, community activity, yard work, and  church  PERSONAL FACTORS Age, Behavior pattern, Fitness, Past/current experiences, Time since onset of injury/illness/exacerbation, and 3+ comorbidities: depression, HLD, GERD, migraine, tremor, R lumpectomy, MCI  are also affecting patient's functional outcome.   REHAB POTENTIAL: Good  CLINICAL DECISION MAKING: Evolving/moderate complexity  EVALUATION COMPLEXITY: Moderate  PLAN: PT FREQUENCY: 1-2x/week  PT DURATION: 6 weeks  PLANNED INTERVENTIONS: Therapeutic exercises, Therapeutic activity, Neuromuscular re-education, Balance training, Gait training, Patient/Family education, Self Care, Joint mobilization, Stair training, Vestibular training, Canalith repositioning, Aquatic Therapy, Dry Needling, Electrical stimulation, Cryotherapy, Moist heat, Taping, Manual therapy, and Re-evaluation  PLAN FOR NEXT SESSION: progress core strengthening, lumbopelvic stretching, ankle stretching, progress balance activities   Anette Guarneri, PT, DPT 06/01/22 4:25 PM  [facility] at [facility] [address], [address] [place], [place] [address] Phone # [phone] Fax # [phone]

## 2022-05-31 NOTE — Addendum Note (Signed)
Addended by: Ihor Austin L on: 05/31/2022 07:22 AM   Modules accepted: Orders

## 2022-06-01 ENCOUNTER — Ambulatory Visit: Payer: Medicare HMO | Admitting: Physical Therapy

## 2022-06-01 ENCOUNTER — Encounter: Payer: Self-pay | Admitting: Physical Therapy

## 2022-06-01 DIAGNOSIS — R252 Cramp and spasm: Secondary | ICD-10-CM | POA: Diagnosis not present

## 2022-06-01 DIAGNOSIS — R448 Other symptoms and signs involving general sensations and perceptions: Secondary | ICD-10-CM | POA: Diagnosis not present

## 2022-06-01 DIAGNOSIS — G8929 Other chronic pain: Secondary | ICD-10-CM | POA: Diagnosis not present

## 2022-06-01 DIAGNOSIS — R2681 Unsteadiness on feet: Secondary | ICD-10-CM | POA: Diagnosis not present

## 2022-06-01 DIAGNOSIS — R2689 Other abnormalities of gait and mobility: Secondary | ICD-10-CM | POA: Diagnosis not present

## 2022-06-01 DIAGNOSIS — M545 Low back pain, unspecified: Secondary | ICD-10-CM | POA: Diagnosis not present

## 2022-06-01 DIAGNOSIS — G250 Essential tremor: Secondary | ICD-10-CM | POA: Diagnosis not present

## 2022-06-02 NOTE — Therapy (Signed)
OUTPATIENT PHYSICAL THERAPY NEURO TREATMENT   Patient Name: Lindsey Frank MRN: [id number] DOB:Jul 21, 1946, 76 y.o., female Today's Date: 06/05/2022   PCP: [name], MD REFERRING PROVIDER: Ihor Austin, NP   PT End of Session - 06/05/22 1359     Visit Number 3    Number of Visits 13    Date for PT Re-Evaluation 07/11/22    Authorization Type [employer] Medicare (10th Visit PN)    PT Start Time 1317    PT Stop Time 1358    PT Time Calculation (min) 41 min    Equipment Utilized During Treatment Gait belt    Activity Tolerance Patient tolerated treatment well    Behavior During Therapy WFL for tasks assessed/performed               Past Medical History:  Diagnosis Date   Abnormal Pap smear of cervix 2019   HR HPV + recurrent    Depression    Elevated C-reactive protein (CRP) 01/2005   Elevated cholesterol 01/2005   Elevated triglycerides with high cholesterol 01/2005   GERD (gastroesophageal reflux disease)    History of endometriosis    Migraine    Tremor, essential    Past Surgical History:  Procedure Laterality Date   BREAST LUMPECTOMY Right 1992   small lumpectomy for benign tumor   CATARACT EXTRACTION Left 01/02/2018   Dr. Dagoberto Ligas   COSMETIC SURGERY  2008   neck lift   LAPAROSCOPY  1981   endometriosis   TONSILECTOMY, ADENOIDECTOMY, BILATERAL MYRINGOTOMY AND TUBES  1953   age 9   UTERINE FIBROID SURGERY  1977   with appendectomy   Patient Active Problem List   Diagnosis Date Noted   Impaired fasting glucose 07/04/2016   Depression, major, in remission (HCC) 11/17/2013   GERD (gastroesophageal reflux disease) 05/14/2013   Tobacco use disorder 04/24/2012   Mixed hyperlipidemia 10/26/2011   Pure hyperglyceridemia 03/30/2011   Depressive disorder, not elsewhere classified 03/30/2011   Benign essential tremor 02/27/2011   Pure hypercholesterolemia 02/27/2011   Migraine     ONSET DATE: August 2022  REFERRING DIAG:  R44.8 (ICD-10-CM) - Sensory  impairment R26.89 (ICD-10-CM) - Abnormality of gait due to impairment of balance G25.0 (ICD-10-CM) - Essential tremor  THERAPY DIAG:  Unsteadiness on feet  Chronic bilateral low back pain without sciatica  Other abnormalities of gait and mobility  Cramp and spasm  Rationale for Evaluation and Treatment Rehabilitation  SUBJECTIVE:  SUBJECTIVE STATEMENT: Not much new. The R calf is sore. Pt accompanied by: self  PERTINENT HISTORY: depression, HLD, GERD, migraine, tremor, R lumpectomy, MCI  PAIN:  Are you having pain? Yes: NPRS scale: 5/10 Pain location: R calf Pain description: sore Aggravating factors: pressure Relieving factors: nothing   PRECAUTIONS: Fall   PATIENT GOALS  "get this numbness gone and the back pain"  OBJECTIVE:     TODAY'S TREATMENT: 06/05/22 Activity Comments  Nustep L5 x 4 Ues/Les, 1 additional minute with Les only Cues to maintain ~60SPM; limited by c/o back pain   Prayer stretch with green pball 10x5" Good tolerance  Pelvic tilts 10x hooklying Manual cues   Deadbug with green pball on belly 10x  Good effort to maintain posterior pelvic tilt.   overhead medball reach with red medball 10x Manual cues with good carryover and ability to maintain posterior pelvic tilt   R/L forward/back stepping with and without UE support and CGA Cues for "big toe down" to avoid ankle falling into eversion   sidestep over hurdle sidestepping and backwards stepping with 1 handrail Able to perform with and without 1 UE support; cues for longer step length; c/o some dizziness with repeated turning   mini squat 10x Cues to weight shift backwards     LOWER EXTREMITY MMT:    MMT Right 06/05/22 Left 06/05/22  Ankle dorsiflexion    Ankle plantarflexion    Ankle inversion 4 4   Ankle eversion 4 4  (Blank rows = not tested)    PATIENT EDUCATION: Education details: edu on more supportive footwear and use of orthotics for improved foot positioning and arch support.  Person educated: Patient Education method: Explanation Education comprehension: verbalized understanding and returned demonstration   HOME EXERCISE PROGRAM Last updated: 06/01/22 Access Code: [email or web address] URL: [email or web address] Date: 06/01/2022 Prepared by: [facility] - Outpatient  Rehab - [facility]  Exercises - Gastroc Stretch on Wall  - 1 x daily - 5 x weekly - 2 sets - 10 reps - 30 sec hold - Standing Anterior Tibialis Stretch  - 1 x daily - 5 x weekly - 2 sets - 10 reps - 30 sec hold - Heel Toe Raises with Counter Support  - 1 x daily - 5 x weekly - 2 sets - 10 reps - Standing Toe Taps  - 1 x daily - 5 x weekly - 2 sets - 10 reps - Narrow Stance with Counter Support  - 1 x daily - 5 x weekly - 2 sets - 30 sec hold - Seated Pelvic Tilt  - 1 x daily - 5 x weekly - 2 sets - 10 reps - Standing Shoulder Row with Anchored Resistance  - 1 x daily - 5 x weekly - 2 sets - 10 reps - Shoulder Extension with Resistance - Palms Forward  - 1 x daily - 5 x weekly - 2 sets - 10 reps   Below measures were taken at time of initial evaluation unless otherwise specified:  DIAGNOSTIC FINDINGS: none recent  COGNITION: Overall cognitive status: No family/caregiver present to determine baseline cognitive functioning   SENSATION: WFL; c/o L side slightly decreased sensation  COORDINATION: Intact B alt pronation/supination and alt toe tap   POSTURE: rounded shoulders, forward head, and increased thoracic kyphosis  LOWER EXTREMITY ROM:     Active  Right Eval Left Eval  Hip flexion    Hip extension    Hip abduction    Hip adduction    Hip internal  rotation    Hip external rotation    Knee flexion    Knee extension    Ankle dorsiflexion 10 10  Ankle plantarflexion    Ankle  inversion    Ankle eversion     (Blank rows = not tested)  LOWER EXTREMITY MMT:    MMT Right Eval Left Eval  Hip flexion 4+ 5  Hip extension    Hip abduction 4 3+  Hip adduction 4 3+  Hip internal rotation    Hip external rotation    Knee flexion 5 5  Knee extension 5 4+  Ankle dorsiflexion    Ankle plantarflexion 4 (5 reps with compensations) 4 (3 reps with compensations)  Ankle inversion 4+ 4  Ankle eversion 4 4+  (Blank rows = not tested)   GAIT: Gait pattern:  foot flat at  heel strike with B short step length, trunk and neck flexed throughout Assistive device utilized: None   Active ROM 06/01/22  Lumbar flexion Ankles   Lumbar extension 50% limited   Lumbar R SBing Proximal tibia  Lumbar L SBing Jt line  Lumbar R rotation  25% limited   Lumbar L rotation normal  *c/o no LBP, but some "nagging pain in upper back"       PATIENT EDUCATION: Education details: prognosis, POC, HEP Person educated: Patient Education method: Explanation, Actor cues, Verbal cues, and Handouts Education comprehension: verbalized understanding   HOME EXERCISE PROGRAM: Access Code: [email or web address] URL: [email or web address] Date: 05/30/2022 Prepared by: [facility] - Outpatient  Rehab - [facility]  Exercises - Gastroc Stretch on Wall  - 1 x daily - 5 x weekly - 2 sets - 10 reps - 30 sec hold - Standing Anterior Tibialis Stretch  - 1 x daily - 5 x weekly - 2 sets - 10 reps - 30 sec hold - Heel Toe Raises with Counter Support  - 1 x daily - 5 x weekly - 2 sets - 10 reps - Standing Toe Taps  - 1 x daily - 5 x weekly - 2 sets - 10 reps - Narrow Stance with Counter Support  - 1 x daily - 5 x weekly - 2 sets - 30 sec hold    GOALS: Goals reviewed with patient? Yes  SHORT TERM GOALS: Target date: 06/20/2022  Patient to be independent with initial HEP. Baseline: HEP initiated Goal status: IN PROGRESS    LONG TERM GOALS: Target date: 07/11/2022  Patient to be  independent with advanced HEP. Baseline: Not yet initiated  Goal status: IN PROGRESS  Patient to demonstrate B LE strength >/=4+/5.  Baseline: See above Goal status: IN PROGRESS  Patient to demonstrate B ankle dorsiflexion AROM to atleast 14 degrees to improve gait mechanics. Baseline: 10 deg B Goal status: IN PROGRESS  Patient to demonstrate alternating reciprocal pattern when ascending and descending stairs with good stability and 1 handrail as needed.   Baseline: Unable Goal status: IN PROGRESS  Patient to score at least 20/24 on DGI in order to decrease risk of falls.  Baseline: 15/24 Goal status: IN PROGRESS  Patient to demonstrate safe ambulation with LRAD on outdoor surfaces such as sidewalk, hills, and grass with good stability. Baseline: NT Goal status: IN PROGRESS  Patient to demonstrate safe kneeling/bending to simulate gardening activities without back pain limiting.  Baseline: reports pain with gardening  Goal status: IN PROGRESS   ASSESSMENT:  CLINICAL IMPRESSION: Patient arrived to session with report of some R calf soreness since this AM- no warmth,  redness, swelling evident with palpation. Educated patient on more supportive footwear and use of orthotics for improved foot positioning and arch support. Patient did c/o mid back pain after warmup which was addressed with stretching. Worked on gentle core stability and mobility activities with intermittent cueing for form. Patient demonstrated good ability to maintain core contraction with exercises and maintain reciprocal movements with deadbug. Patient with tendency to fall into inversion with balance activities but responded well to cues to correct this. Ankle strength testing revealed some ankle weakness that still needs to be addressed. Patient tolerated session well and without complaints upon leaving.     OBJECTIVE IMPAIRMENTS Abnormal gait, decreased activity tolerance, decreased balance, decreased strength,  increased muscle spasms, impaired flexibility, impaired sensation, improper body mechanics, postural dysfunction, and pain.   ACTIVITY LIMITATIONS carrying, lifting, bending, standing, squatting, stairs, transfers, bathing, toileting, dressing, reach over head, and hygiene/grooming  PARTICIPATION LIMITATIONS: meal prep, cleaning, laundry, driving, shopping, community activity, yard work, and church  PERSONAL FACTORS Age, Behavior pattern, Fitness, Past/current experiences, Time since onset of injury/illness/exacerbation, and 3+ comorbidities: depression, HLD, GERD, migraine, tremor, R lumpectomy, MCI  are also affecting patient's functional outcome.   REHAB POTENTIAL: Good  CLINICAL DECISION MAKING: Evolving/moderate complexity  EVALUATION COMPLEXITY: Moderate  PLAN: PT FREQUENCY: 1-2x/week  PT DURATION: 6 weeks  PLANNED INTERVENTIONS: Therapeutic exercises, Therapeutic activity, Neuromuscular re-education, Balance training, Gait training, Patient/Family education, Self Care, Joint mobilization, Stair training, Vestibular training, Canalith repositioning, Aquatic Therapy, Dry Needling, Electrical stimulation, Cryotherapy, Moist heat, Taping, Manual therapy, and Re-evaluation  PLAN FOR NEXT SESSION: progress core strengthening, lumbopelvic stretching, ankle stretching, progress balance activities   Anette Guarneri, PT, DPT 06/05/22 2:00 PM  [facility] at [facility] [address] [place], [place] [address] Phone # [phone] Fax # [phone]

## 2022-06-05 ENCOUNTER — Encounter: Payer: Self-pay | Admitting: Physical Therapy

## 2022-06-05 ENCOUNTER — Ambulatory Visit: Payer: Medicare HMO | Admitting: Physical Therapy

## 2022-06-05 DIAGNOSIS — R252 Cramp and spasm: Secondary | ICD-10-CM

## 2022-06-05 DIAGNOSIS — G8929 Other chronic pain: Secondary | ICD-10-CM

## 2022-06-05 DIAGNOSIS — R2689 Other abnormalities of gait and mobility: Secondary | ICD-10-CM | POA: Diagnosis not present

## 2022-06-05 DIAGNOSIS — R448 Other symptoms and signs involving general sensations and perceptions: Secondary | ICD-10-CM | POA: Diagnosis not present

## 2022-06-05 DIAGNOSIS — G250 Essential tremor: Secondary | ICD-10-CM | POA: Diagnosis not present

## 2022-06-05 DIAGNOSIS — R2681 Unsteadiness on feet: Secondary | ICD-10-CM

## 2022-06-05 DIAGNOSIS — M545 Low back pain, unspecified: Secondary | ICD-10-CM | POA: Diagnosis not present

## 2022-06-06 NOTE — Therapy (Signed)
OUTPATIENT PHYSICAL THERAPY NEURO TREATMENT   Patient Name: Lindsey Frank MRN: [id number] DOB:1946-02-15, 76 y.o., female Today's Date: 06/07/2022   PCP: [name], MD REFERRING PROVIDER: Ihor Austin, NP   PT End of Session - 06/07/22 1356     Visit Number 4    Number of Visits 13    Date for PT Re-Evaluation 07/11/22    Authorization Type [employer] Medicare (10th Visit PN)    PT Start Time 1318    PT Stop Time 1356    PT Time Calculation (min) 38 min    Activity Tolerance Patient tolerated treatment well    Behavior During Therapy [facility] for tasks assessed/performed                Past Medical History:  Diagnosis Date   Abnormal Pap smear of cervix 2019   HR HPV + recurrent    Depression    Elevated C-reactive protein (CRP) 01/2005   Elevated cholesterol 01/2005   Elevated triglycerides with high cholesterol 01/2005   GERD (gastroesophageal reflux disease)    History of endometriosis    Migraine    Tremor, essential    Past Surgical History:  Procedure Laterality Date   BREAST LUMPECTOMY Right 1992   small lumpectomy for benign tumor   CATARACT EXTRACTION Left 01/02/2018   Dr. Dagoberto Ligas   COSMETIC SURGERY  2008   neck lift   LAPAROSCOPY  1981   endometriosis   TONSILECTOMY, ADENOIDECTOMY, BILATERAL MYRINGOTOMY AND TUBES  1953   age 5   UTERINE FIBROID SURGERY  1977   with appendectomy   Patient Active Problem List   Diagnosis Date Noted   Impaired fasting glucose 07/04/2016   Depression, major, in remission (HCC) 11/17/2013   GERD (gastroesophageal reflux disease) 05/14/2013   Tobacco use disorder 04/24/2012   Mixed hyperlipidemia 10/26/2011   Pure hyperglyceridemia 03/30/2011   Depressive disorder, not elsewhere classified 03/30/2011   Benign essential tremor 02/27/2011   Pure hypercholesterolemia 02/27/2011   Migraine     ONSET DATE: August 2022  REFERRING DIAG:  R44.8 (ICD-10-CM) - Sensory impairment R26.89 (ICD-10-CM) - Abnormality of  gait due to impairment of balance G25.0 (ICD-10-CM) - Essential tremor  THERAPY DIAG:  Unsteadiness on feet  Chronic bilateral low back pain without sciatica  Other abnormalities of gait and mobility  Cramp and spasm  Rationale for Evaluation and Treatment Rehabilitation  SUBJECTIVE:  SUBJECTIVE STATEMENT: Not much new. The R calf is sore. Pt accompanied by: self  PERTINENT HISTORY: depression, HLD, GERD, migraine, tremor, R lumpectomy, MCI  PAIN:  Are you having pain? Yes: NPRS scale: 5/10 Pain location: LB Pain description: achy/sore Aggravating factors: standing Relieving factors: sitting  PRECAUTIONS: Fall   PATIENT GOALS  "get this numbness gone and the back pain"  OBJECTIVE:     TODAY'S TREATMENT: 06/07/22 Activity Comments  TB row with red TB 10x, extension 10x  Cues to tuck tailbone under and retract scapulae  paloff press with red TB 15x each side Cues for proper positioning   Thoracic extension over back of chair 15x  Sitting on pillow for proper positioning; reported no discomfort  sidestepping with yellow loop around toes at counter Cues to look straight ahead and increase step length  walking on heels/toes along counter 4x each Using 1 UE support; cues to tuck tailbone under; pt noting easier to walk on heels   tandem walk with 1 UE support on counter 4x Cues to contract core; weaning to 2 finger support  Walking with EC 4x along counter  No UE support; cues to increase step length  backwards walk without UE support Good stability  step up + opposite SKTC 10x each 1 UE support; cues to avoid posterior lean     HOME EXERCISE PROGRAM Last updated: 06/01/22 Access Code: [email or web address] URL: [email or web address] Date: 06/01/2022 Prepared by: [facility] - Outpatient   Rehab - [facility]  Exercises - Gastroc Stretch on Wall  - 1 x daily - 5 x weekly - 2 sets - 10 reps - 30 sec hold - Standing Anterior Tibialis Stretch  - 1 x daily - 5 x weekly - 2 sets - 10 reps - 30 sec hold - Heel Toe Raises with Counter Support  - 1 x daily - 5 x weekly - 2 sets - 10 reps - Standing Toe Taps  - 1 x daily - 5 x weekly - 2 sets - 10 reps - Narrow Stance with Counter Support  - 1 x daily - 5 x weekly - 2 sets - 30 sec hold - Seated Pelvic Tilt  - 1 x daily - 5 x weekly - 2 sets - 10 reps - Standing Shoulder Row with Anchored Resistance  - 1 x daily - 5 x weekly - 2 sets - 10 reps - Shoulder Extension with Resistance - Palms Forward  - 1 x daily - 5 x weekly - 2 sets - 10 reps   Below measures were taken at time of initial evaluation unless otherwise specified:  DIAGNOSTIC FINDINGS: none recent  COGNITION: Overall cognitive status: No family/caregiver present to determine baseline cognitive functioning   SENSATION: WFL; c/o L side slightly decreased sensation  COORDINATION: Intact B alt pronation/supination and alt toe tap   POSTURE: rounded shoulders, forward head, and increased thoracic kyphosis  LOWER EXTREMITY ROM:     Active  Right Eval Left Eval  Hip flexion    Hip extension    Hip abduction    Hip adduction    Hip internal rotation    Hip external rotation    Knee flexion    Knee extension    Ankle dorsiflexion 10 10  Ankle plantarflexion    Ankle inversion    Ankle eversion     (Blank rows = not tested)  LOWER EXTREMITY MMT:    MMT Right Eval Left Eval  Hip flexion 4+ 5  Hip extension  Hip abduction 4 3+  Hip adduction 4 3+  Hip internal rotation    Hip external rotation    Knee flexion 5 5  Knee extension 5 4+  Ankle dorsiflexion    Ankle plantarflexion 4 (5 reps with compensations) 4 (3 reps with compensations)  Ankle inversion 4+ 4  Ankle eversion 4 4+  (Blank rows = not tested)   GAIT: Gait pattern:   foot flat at  heel strike with B short step length, trunk and neck flexed throughout Assistive device utilized: None   Active ROM 06/01/22  Lumbar flexion Ankles   Lumbar extension 50% limited   Lumbar R SBing Proximal tibia  Lumbar L SBing Jt line  Lumbar R rotation  25% limited   Lumbar L rotation normal  *c/o no LBP, but some "nagging pain in upper back"       PATIENT EDUCATION: Education details: prognosis, POC, HEP Person educated: Patient Education method: Explanation, Actor cues, Verbal cues, and Handouts Education comprehension: verbalized understanding   HOME EXERCISE PROGRAM: Access Code: [email or web address] URL: [email or web address] Date: 05/30/2022 Prepared by: [facility] - Outpatient  Rehab - [facility]  Exercises - Gastroc Stretch on Wall  - 1 x daily - 5 x weekly - 2 sets - 10 reps - 30 sec hold - Standing Anterior Tibialis Stretch  - 1 x daily - 5 x weekly - 2 sets - 10 reps - 30 sec hold - Heel Toe Raises with Counter Support  - 1 x daily - 5 x weekly - 2 sets - 10 reps - Standing Toe Taps  - 1 x daily - 5 x weekly - 2 sets - 10 reps - Narrow Stance with Counter Support  - 1 x daily - 5 x weekly - 2 sets - 30 sec hold    GOALS: Goals reviewed with patient? Yes  SHORT TERM GOALS: Target date: 06/20/2022  Patient to be independent with initial HEP. Baseline: HEP initiated Goal status: IN PROGRESS    LONG TERM GOALS: Target date: 07/11/2022  Patient to be independent with advanced HEP. Baseline: Not yet initiated  Goal status: IN PROGRESS  Patient to demonstrate B LE strength >/=4+/5.  Baseline: See above Goal status: IN PROGRESS  Patient to demonstrate B ankle dorsiflexion AROM to atleast 14 degrees to improve gait mechanics. Baseline: 10 deg B Goal status: IN PROGRESS  Patient to demonstrate alternating reciprocal pattern when ascending and descending stairs with good stability and 1 handrail as needed.   Baseline: Unable Goal  status: IN PROGRESS  Patient to score at least 20/24 on DGI in order to decrease risk of falls.  Baseline: 15/24 Goal status: IN PROGRESS  Patient to demonstrate safe ambulation with LRAD on outdoor surfaces such as sidewalk, hills, and grass with good stability. Baseline: NT Goal status: IN PROGRESS  Patient to demonstrate safe kneeling/bending to simulate gardening activities without back pain limiting.  Baseline: reports pain with gardening  Goal status: IN PROGRESS   ASSESSMENT:  CLINICAL IMPRESSION: Patient arrived to session with report of some LBP. Worked on maintaining neutral spinal positioning with resisted core strengthening, requiring reminders to maintain. Thoracic extension mobility was initiated with good tolerance. Worked on dynamic balance activities requiring 1 UE support throughout. Patient with tendency for posterior lean with step ups, requiring cues to address. Patient tolerated session well and without complaints upon leaving.     OBJECTIVE IMPAIRMENTS Abnormal gait, decreased activity tolerance, decreased balance, decreased strength, increased muscle spasms, impaired flexibility, impaired sensation,  improper body mechanics, postural dysfunction, and pain.   ACTIVITY LIMITATIONS carrying, lifting, bending, standing, squatting, stairs, transfers, bathing, toileting, dressing, reach over head, and hygiene/grooming  PARTICIPATION LIMITATIONS: meal prep, cleaning, laundry, driving, shopping, community activity, yard work, and church  PERSONAL FACTORS Age, Behavior pattern, Fitness, Past/current experiences, Time since onset of injury/illness/exacerbation, and 3+ comorbidities: depression, HLD, GERD, migraine, tremor, R lumpectomy, MCI  are also affecting patient's functional outcome.   REHAB POTENTIAL: Good  CLINICAL DECISION MAKING: Evolving/moderate complexity  EVALUATION COMPLEXITY: Moderate  PLAN: PT FREQUENCY: 1-2x/week  PT DURATION: 6 weeks  PLANNED  INTERVENTIONS: Therapeutic exercises, Therapeutic activity, Neuromuscular re-education, Balance training, Gait training, Patient/Family education, Self Care, Joint mobilization, Stair training, Vestibular training, Canalith repositioning, Aquatic Therapy, Dry Needling, Electrical stimulation, Cryotherapy, Moist heat, Taping, Manual therapy, and Re-evaluation  PLAN FOR NEXT SESSION: progress core strengthening, lumbopelvic stretching, ankle stretching, progress balance activities   Anette Guarneri, PT, DPT 06/07/22 1:58 PM  [facility] at [facility] [address], [address] [place], [place] [address] Phone # [phone] Fax # [phone]

## 2022-06-07 ENCOUNTER — Ambulatory Visit: Payer: Medicare HMO | Admitting: Physical Therapy

## 2022-06-07 ENCOUNTER — Encounter: Payer: Self-pay | Admitting: Physical Therapy

## 2022-06-07 DIAGNOSIS — G8929 Other chronic pain: Secondary | ICD-10-CM

## 2022-06-07 DIAGNOSIS — M545 Low back pain, unspecified: Secondary | ICD-10-CM | POA: Diagnosis not present

## 2022-06-07 DIAGNOSIS — R448 Other symptoms and signs involving general sensations and perceptions: Secondary | ICD-10-CM | POA: Diagnosis not present

## 2022-06-07 DIAGNOSIS — R2681 Unsteadiness on feet: Secondary | ICD-10-CM | POA: Diagnosis not present

## 2022-06-07 DIAGNOSIS — R252 Cramp and spasm: Secondary | ICD-10-CM

## 2022-06-07 DIAGNOSIS — R2689 Other abnormalities of gait and mobility: Secondary | ICD-10-CM

## 2022-06-07 DIAGNOSIS — G250 Essential tremor: Secondary | ICD-10-CM | POA: Diagnosis not present

## 2022-06-09 NOTE — Therapy (Signed)
OUTPATIENT PHYSICAL THERAPY NEURO TREATMENT   Patient Name: Lindsey Frank MRN: [id number] DOB:[birth year], 76 y.o., female Today's Date: 06/12/2022   PCP: [name], MD REFERRING PROVIDER: Ihor Austin, NP   PT End of Session - 06/12/22 1357     Visit Number 5    Number of Visits 13    Date for PT Re-Evaluation 07/11/22    Authorization Type [employer] (10th Visit PN)    PT Start Time 1314    PT Stop Time 1358    PT Time Calculation (min) 44 min    Equipment Utilized During Treatment Gait belt    Activity Tolerance Patient tolerated treatment well    Behavior During Therapy [facility] for tasks assessed/performed                 Past Medical History:  Diagnosis Date   Abnormal Pap smear of cervix 2019   HR HPV + recurrent    Depression    Elevated C-reactive protein (CRP) 01/2005   Elevated cholesterol 01/2005   Elevated triglycerides with high cholesterol 01/2005   GERD (gastroesophageal reflux disease)    History of endometriosis    Migraine    Tremor, essential    Past Surgical History:  Procedure Laterality Date   BREAST LUMPECTOMY Right 1992   small lumpectomy for benign tumor   CATARACT EXTRACTION Left 01/02/2018   Dr. Dagoberto Ligas   COSMETIC SURGERY  2008   neck lift   LAPAROSCOPY  1981   endometriosis   TONSILECTOMY, ADENOIDECTOMY, BILATERAL MYRINGOTOMY AND TUBES  1953   age 89   UTERINE FIBROID SURGERY  1977   with appendectomy   Patient Active Problem List   Diagnosis Date Noted   Impaired fasting glucose 07/04/2016   Depression, major, in remission (HCC) 11/17/2013   GERD (gastroesophageal reflux disease) 05/14/2013   Tobacco use disorder 04/24/2012   Mixed hyperlipidemia 10/26/2011   Pure hyperglyceridemia 03/30/2011   Depressive disorder, not elsewhere classified 03/30/2011   Benign essential tremor 02/27/2011   Pure hypercholesterolemia 02/27/2011   Migraine     ONSET DATE: August 2022  REFERRING DIAG:  R44.8 (ICD-10-CM) -  Sensory impairment R26.89 (ICD-10-CM) - Abnormality of gait due to impairment of balance G25.0 (ICD-10-CM) - Essential tremor  THERAPY DIAG:  Unsteadiness on feet  Chronic bilateral low back pain without sciatica  Other abnormalities of gait and mobility  Cramp and spasm  Rationale for Evaluation and Treatment Rehabilitation  SUBJECTIVE:  SUBJECTIVE STATEMENT: Worked in her yard over the weekend and for the first time she noticed that she didn't have any pain when bending over.  Pt accompanied by: self  PERTINENT HISTORY: depression, HLD, GERD, migraine, tremor, R lumpectomy, MCI  PAIN:  Are you having pain? Yes: NPRS scale: 3/10 Pain location: midback Pain description: achy/sore Aggravating factors: standing Relieving factors: sitting  PRECAUTIONS: Fall   PATIENT GOALS  "get this numbness gone and the back pain"  OBJECTIVE:      TODAY'S TREATMENT: 06/12/22 Activity Comments  Nustep L1 x 5 min Ues/Les  Maintaining ~70's SPM; no back pain  staggered ant/pos wt shifts in II bars Requiring demonstration and consistent cueing for proper form  wide stance lateral wt shifts in II bars  Requiring demonstration and consistent cueing for proper form  Romberg EO, EC 2x30" each Mild-mod sway with EC  fwd/stepping on foam 2x10 each  B ankle instability; intermittent UE use   3 cone toe tap 15x each LE CGA; good response to cues to contract core and shift weight onto standing leg   step up R/L 5x Without UEs  single leg heel raise 15x Demo and cueing required for coordination          HOME EXERCISE PROGRAM Last updated: 06/12/22 Access Code: [email or web address] URL: [email or web address] Date: 06/12/2022 Prepared by: [facility] - Outpatient  Rehab - [facility]  Exercises -  Gastroc Stretch on Wall  - 1 x daily - 5 x weekly - 2 sets - 10 reps - 30 sec hold - Standing Anterior Tibialis Stretch  - 1 x daily - 5 x weekly - 2 sets - 10 reps - 30 sec hold - Heel Toe Raises with Counter Support  - 1 x daily - 5 x weekly - 2 sets - 10 reps - Standing Toe Taps  - 1 x daily - 5 x weekly - 2 sets - 10 reps - Narrow Stance with Counter Support  - 1 x daily - 5 x weekly - 2 sets - 30 sec hold - Seated Pelvic Tilt  - 1 x daily - 5 x weekly - 2 sets - 10 reps - Standing Shoulder Row with Anchored Resistance  - 1 x daily - 5 x weekly - 2 sets - 10 reps - Shoulder Extension with Resistance - Palms Forward  - 1 x daily - 5 x weekly - 2 sets - 10 reps - Romberg Stance with Eyes Closed  - 1 x daily - 5 x weekly - 2-3 sets - 30 sec hold    PATIENT EDUCATION: Education details: HEP update Person educated: Patient Education method: Explanation, Demonstration, Tactile cues, Verbal cues, and Handouts Education comprehension: verbalized understanding and returned demonstration    Below measures were taken at time of initial evaluation unless otherwise specified:  DIAGNOSTIC FINDINGS: none recent  COGNITION: Overall cognitive status: No family/caregiver present to determine baseline cognitive functioning   SENSATION: WFL; c/o L side slightly decreased sensation  COORDINATION: Intact B alt pronation/supination and alt toe tap   POSTURE: rounded shoulders, forward head, and increased thoracic kyphosis  LOWER EXTREMITY ROM:     Active  Right Eval Left Eval  Hip flexion    Hip extension    Hip abduction    Hip adduction    Hip internal rotation    Hip external rotation    Knee flexion    Knee extension    Ankle dorsiflexion 10 10  Ankle plantarflexion    Ankle  inversion    Ankle eversion     (Blank rows = not tested)  LOWER EXTREMITY MMT:    MMT Right Eval Left Eval  Hip flexion 4+ 5  Hip extension    Hip abduction 4 3+  Hip adduction 4 3+  Hip internal  rotation    Hip external rotation    Knee flexion 5 5  Knee extension 5 4+  Ankle dorsiflexion    Ankle plantarflexion 4 (5 reps with compensations) 4 (3 reps with compensations)  Ankle inversion 4+ 4  Ankle eversion 4 4+  (Blank rows = not tested)   GAIT: Gait pattern:  foot flat at  heel strike with B short step length, trunk and neck flexed throughout Assistive device utilized: None   Active ROM 06/01/22  Lumbar flexion Ankles   Lumbar extension 50% limited   Lumbar R SBing Proximal tibia  Lumbar L SBing Jt line  Lumbar R rotation  25% limited   Lumbar L rotation normal  *c/o no LBP, but some "nagging pain in upper back"       PATIENT EDUCATION: Education details: prognosis, POC, HEP Person educated: Patient Education method: Explanation, Actor cues, Verbal cues, and Handouts Education comprehension: verbalized understanding   HOME EXERCISE PROGRAM: Access Code: [email or web address] URL: [email or web address] Date: 05/30/2022 Prepared by: [facility] - Outpatient  Rehab - [facility]  Exercises - Gastroc Stretch on Wall  - 1 x daily - 5 x weekly - 2 sets - 10 reps - 30 sec hold - Standing Anterior Tibialis Stretch  - 1 x daily - 5 x weekly - 2 sets - 10 reps - 30 sec hold - Heel Toe Raises with Counter Support  - 1 x daily - 5 x weekly - 2 sets - 10 reps - Standing Toe Taps  - 1 x daily - 5 x weekly - 2 sets - 10 reps - Narrow Stance with Counter Support  - 1 x daily - 5 x weekly - 2 sets - 30 sec hold    GOALS: Goals reviewed with patient? Yes  SHORT TERM GOALS: Target date: 06/20/2022  Patient to be independent with initial HEP. Baseline: HEP initiated Goal status: INITIAL    LONG TERM GOALS: Target date: 07/11/2022  Patient to be independent with advanced HEP. Baseline: Not yet initiated  Goal status: IN PROGRESS  Patient to demonstrate B LE strength >/=4+/5.  Baseline: See above Goal status: IN PROGRESS  Patient to demonstrate B  ankle dorsiflexion AROM to atleast 14 degrees to improve gait mechanics. Baseline: 10 deg B Goal status: IN PROGRESS  Patient to demonstrate alternating reciprocal pattern when ascending and descending stairs with good stability and 1 handrail as needed.   Baseline: Unable Goal status: IN PROGRESS  Patient to score at least 20/24 on DGI in order to decrease risk of falls.  Baseline: 15/24 Goal status: IN PROGRESS  Patient to demonstrate safe ambulation with LRAD on outdoor surfaces such as sidewalk, hills, and grass with good stability. Baseline: NT Goal status: IN PROGRESS  Patient to demonstrate safe kneeling/bending to simulate gardening activities without back pain limiting.  Baseline: reports pain with gardening  Goal status: IN PROGRESS   ASSESSMENT:  CLINICAL IMPRESSION: Patient arrived to session with report of improved tolerance for yardwork since starting therapy. Worked on dynamic balance activities with challenges including weight shifts and compliant surfaces. Patient with initial difficulty coordinating weight shifting activities which improved somewhat with demonstration. Balance with EC appeared challenging, thus updated this  into HEP. Ankle instability was evident with forward and stepping activities. Improved stability was evident after providing cues to shift weight onto standing LE with SLS activities. HEP was updated for max benefit- patient reported understanding and without complaints at end of session.     OBJECTIVE IMPAIRMENTS Abnormal gait, decreased activity tolerance, decreased balance, decreased strength, increased muscle spasms, impaired flexibility, impaired sensation, improper body mechanics, postural dysfunction, and pain.   ACTIVITY LIMITATIONS carrying, lifting, bending, standing, squatting, stairs, transfers, bathing, toileting, dressing, reach over head, and hygiene/grooming  PARTICIPATION LIMITATIONS: meal prep, cleaning, laundry, driving,  shopping, community activity, yard work, and church  PERSONAL FACTORS Age, Behavior pattern, Fitness, Past/current experiences, Time since onset of injury/illness/exacerbation, and 3+ comorbidities: depression, HLD, GERD, migraine, tremor, R lumpectomy, MCI  are also affecting patient's functional outcome.   REHAB POTENTIAL: Good  CLINICAL DECISION MAKING: Evolving/moderate complexity  EVALUATION COMPLEXITY: Moderate  PLAN: PT FREQUENCY: 1-2x/week  PT DURATION: 6 weeks  PLANNED INTERVENTIONS: Therapeutic exercises, Therapeutic activity, Neuromuscular re-education, Balance training, Gait training, Patient/Family education, Self Care, Joint mobilization, Stair training, Vestibular training, Canalith repositioning, Aquatic Therapy, Dry Needling, Electrical stimulation, Cryotherapy, Moist heat, Taping, Manual therapy, and Re-evaluation  PLAN FOR NEXT SESSION: progress core strengthening, lumbopelvic stretching, ankle stretching, progress balance activities   Anette Guarneri, PT, DPT 06/12/22 2:00 PM  [facility] Re[facility] at [facility] [address], [address] [place], [place] [address] Phone # [phone] Fax # [phone]

## 2022-06-12 ENCOUNTER — Ambulatory Visit: Payer: Medicare HMO | Admitting: Physical Therapy

## 2022-06-12 ENCOUNTER — Ambulatory Visit: Payer: Medicare HMO

## 2022-06-12 ENCOUNTER — Encounter: Payer: Self-pay | Admitting: Physical Therapy

## 2022-06-12 DIAGNOSIS — R2681 Unsteadiness on feet: Secondary | ICD-10-CM | POA: Diagnosis not present

## 2022-06-12 DIAGNOSIS — R2689 Other abnormalities of gait and mobility: Secondary | ICD-10-CM | POA: Diagnosis not present

## 2022-06-12 DIAGNOSIS — R252 Cramp and spasm: Secondary | ICD-10-CM

## 2022-06-12 DIAGNOSIS — G8929 Other chronic pain: Secondary | ICD-10-CM | POA: Diagnosis not present

## 2022-06-12 DIAGNOSIS — G250 Essential tremor: Secondary | ICD-10-CM | POA: Diagnosis not present

## 2022-06-12 DIAGNOSIS — R448 Other symptoms and signs involving general sensations and perceptions: Secondary | ICD-10-CM | POA: Diagnosis not present

## 2022-06-12 DIAGNOSIS — M545 Low back pain, unspecified: Secondary | ICD-10-CM | POA: Diagnosis not present

## 2022-06-13 NOTE — Therapy (Signed)
OUTPATIENT PHYSICAL THERAPY NEURO TREATMENT   Patient Name: Lindsey Frank MRN: [id number] DOB:12-11-1945, 76 y.o., female Today's Date: 06/14/2022   PCP: [name], MD REFERRING PROVIDER: Ihor Austin, NP   PT End of Session - 06/14/22 1356     Visit Number 6    Number of Visits 13    Date for PT Re-Evaluation 07/11/22    Authorization Type [employer] Medicare (10th Visit PN)    PT Start Time 1317    PT Stop Time 1356    PT Time Calculation (min) 39 min    Equipment Utilized During Treatment Gait belt    Activity Tolerance Patient tolerated treatment well    Behavior During Therapy WFL for tasks assessed/performed                  Past Medical History:  Diagnosis Date   Abnormal Pap smear of cervix 2019   HR HPV + recurrent    Depression    Elevated C-reactive protein (CRP) 01/2005   Elevated cholesterol 01/2005   Elevated triglycerides with high cholesterol 01/2005   GERD (gastroesophageal reflux disease)    History of endometriosis    Migraine    Tremor, essential    Past Surgical History:  Procedure Laterality Date   BREAST LUMPECTOMY Right 1992   small lumpectomy for benign tumor   CATARACT EXTRACTION Left 01/02/2018   Dr. Dagoberto Ligas   COSMETIC SURGERY  2008   neck lift   LAPAROSCOPY  1981   endometriosis   TONSILECTOMY, ADENOIDECTOMY, BILATERAL MYRINGOTOMY AND TUBES  1953   age 79   UTERINE FIBROID SURGERY  1977   with appendectomy   Patient Active Problem List   Diagnosis Date Noted   Impaired fasting glucose 07/04/2016   Depression, major, in remission (HCC) 11/17/2013   GERD (gastroesophageal reflux disease) 05/14/2013   Tobacco use disorder 04/24/2012   Mixed hyperlipidemia 10/26/2011   Pure hyperglyceridemia 03/30/2011   Depressive disorder, not elsewhere classified 03/30/2011   Benign essential tremor 02/27/2011   Pure hypercholesterolemia 02/27/2011   Migraine     ONSET DATE: August 2022  REFERRING DIAG:  R44.8 (ICD-10-CM) -  Sensory impairment R26.89 (ICD-10-CM) - Abnormality of gait due to impairment of balance G25.0 (ICD-10-CM) - Essential tremor  THERAPY DIAG:  Unsteadiness on feet  Chronic bilateral low back pain without sciatica  Other abnormalities of gait and mobility  Cramp and spasm  Rationale for Evaluation and Treatment Rehabilitation  SUBJECTIVE:  SUBJECTIVE STATEMENT: Doing okay today.  Pt accompanied by: self  PERTINENT HISTORY: depression, HLD, GERD, migraine, tremor, R lumpectomy, MCI  PAIN:  Are you having pain? Yes: NPRS scale: 3/10 Pain location: midback Pain description: achy/sore Aggravating factors: standing Relieving factors: sitting  PRECAUTIONS: Fall   PATIENT GOALS  "get this numbness gone and the back pain"  OBJECTIVE:     TODAY'S TREATMENT: 06/14/22 Activity Comments  Nustep L 3 x 5 min Ues/Les  Maintaining 70s SPM  figure 8 turns with and without ball toss  Cues for large and continuous stepping; tendency for increased lumbar lordosis and posterior wt shift   Quick gait with pivot turns Difficulty keeping up with PT and to perform sharp turns   step up + opposite SKTC on foam 10x  With and without UE support; cues to shift wt anteriorly to avoid posterior LOB  alt toe tap on foam 3x10 Cues to decrease speed and increase control  romberg EC 4x30" Moderate sway with EC; cues to hover hands above II bars and "tuck tailbone"  Rockerboard ant/pos static balance Corrective cues to recover balance; considerable ankle instability     HOME EXERCISE PROGRAM Last updated: 06/12/22 Access Code: [email or web address] URL: [email or web address] Date: 06/12/2022 Prepared by: [facility]  Exercises - Gastroc Stretch on Wall  - 1 x daily - 5 x  weekly - 2 sets - 10 reps - 30 sec hold - Standing Anterior Tibialis Stretch  - 1 x daily - 5 x weekly - 2 sets - 10 reps - 30 sec hold - Heel Toe Raises with Counter Support  - 1 x daily - 5 x weekly - 2 sets - 10 reps - Standing Toe Taps  - 1 x daily - 5 x weekly - 2 sets - 10 reps - Narrow Stance with Counter Support  - 1 x daily - 5 x weekly - 2 sets - 30 sec hold - Seated Pelvic Tilt  - 1 x daily - 5 x weekly - 2 sets - 10 reps - Standing Shoulder Row with Anchored Resistance  - 1 x daily - 5 x weekly - 2 sets - 10 reps - Shoulder Extension with Resistance - Palms Forward  - 1 x daily - 5 x weekly - 2 sets - 10 reps - Romberg Stance with Eyes Closed  - 1 x daily - 5 x weekly - 2-3 sets - 30 sec hold    Below measures were taken at time of initial evaluation unless otherwise specified:  DIAGNOSTIC FINDINGS: none recent  COGNITION: Overall cognitive status: No family/caregiver present to determine baseline cognitive functioning   SENSATION: WFL; c/o L side slightly decreased sensation  COORDINATION: Intact B alt pronation/supination and alt toe tap   POSTURE: rounded shoulders, forward head, and increased thoracic kyphosis  LOWER EXTREMITY ROM:     Active  Right Eval Left Eval  Hip flexion    Hip extension    Hip abduction    Hip adduction    Hip internal rotation    Hip external rotation    Knee flexion    Knee extension    Ankle dorsiflexion 10 10  Ankle plantarflexion    Ankle inversion    Ankle eversion     (Blank rows = not tested)  LOWER EXTREMITY MMT:    MMT Right Eval Left Eval  Hip flexion 4+ 5  Hip extension    Hip abduction 4 3+  Hip adduction  4 3+  Hip internal rotation    Hip external rotation    Knee flexion 5 5  Knee extension 5 4+  Ankle dorsiflexion    Ankle plantarflexion 4 (5 reps with compensations) 4 (3 reps with compensations)  Ankle inversion 4+ 4  Ankle eversion 4 4+  (Blank rows = not tested)   GAIT: Gait pattern:  foot  flat at  heel strike with B short step length, trunk and neck flexed throughout Assistive device utilized: None   Active ROM 06/01/22  Lumbar flexion Ankles   Lumbar extension 50% limited   Lumbar R SBing Proximal tibia  Lumbar L SBing Jt line  Lumbar R rotation  25% limited   Lumbar L rotation normal  *c/o no LBP, but some "nagging pain in upper back"       PATIENT EDUCATION: Education details: prognosis, POC, HEP Person educated: Patient Education method: Explanation, Actor cues, Verbal cues, and Handouts Education comprehension: verbalized understanding   HOME EXERCISE PROGRAM: Access Code: [email or web address] URL: [email or web address] Date: 05/30/2022 Prepared by: [facility] - Outpatient  Rehab - [facility]  Exercises - Gastroc Stretch on Wall  - 1 x daily - 5 x weekly - 2 sets - 10 reps - 30 sec hold - Standing Anterior Tibialis Stretch  - 1 x daily - 5 x weekly - 2 sets - 10 reps - 30 sec hold - Heel Toe Raises with Counter Support  - 1 x daily - 5 x weekly - 2 sets - 10 reps - Standing Toe Taps  - 1 x daily - 5 x weekly - 2 sets - 10 reps - Narrow Stance with Counter Support  - 1 x daily - 5 x weekly - 2 sets - 30 sec hold    GOALS: Goals reviewed with patient? Yes  SHORT TERM GOALS: Target date: 06/20/2022  Patient to be independent with initial HEP. Baseline: HEP initiated Goal status: INITIAL    LONG TERM GOALS: Target date: 07/11/2022  Patient to be independent with advanced HEP. Baseline: Not yet initiated  Goal status: IN PROGRESS  Patient to demonstrate B LE strength >/=4+/5.  Baseline: See above Goal status: IN PROGRESS  Patient to demonstrate B ankle dorsiflexion AROM to atleast 14 degrees to improve gait mechanics. Baseline: 10 deg B Goal status: IN PROGRESS  Patient to demonstrate alternating reciprocal pattern when ascending and descending stairs with good stability and 1 handrail as needed.   Baseline: Unable Goal status:  IN PROGRESS  Patient to score at least 20/24 on DGI in order to decrease risk of falls.  Baseline: 15/24 Goal status: IN PROGRESS  Patient to demonstrate safe ambulation with LRAD on outdoor surfaces such as sidewalk, hills, and grass with good stability. Baseline: NT Goal status: IN PROGRESS  Patient to demonstrate safe kneeling/bending to simulate gardening activities without back pain limiting.  Baseline: reports pain with gardening  Goal status: IN PROGRESS   ASSESSMENT:  CLINICAL IMPRESSION: Patient arrived to session without complaints. Worked on gait training with balance challenges including pivot turns and figure 8 turning. Patient was able to perform these activities with additional dual tasking challenges. Patient with tendency to lean shoulders posteriorly, causing tendency for posterior LOB with today's activities. Required review of neutral positioning with good effort to correct accordingly on patient's part. Exercises without visual fixation and on uneven surface were particularly difficult. Plan to progress per POC.    OBJECTIVE IMPAIRMENTS Abnormal gait, decreased activity tolerance, decreased balance, decreased strength, increased muscle  spasms, impaired flexibility, impaired sensation, improper body mechanics, postural dysfunction, and pain.   ACTIVITY LIMITATIONS carrying, lifting, bending, standing, squatting, stairs, transfers, bathing, toileting, dressing, reach over head, and hygiene/grooming  PARTICIPATION LIMITATIONS: meal prep, cleaning, laundry, driving, shopping, community activity, yard work, and church  PERSONAL FACTORS Age, Behavior pattern, Fitness, Past/current experiences, Time since onset of injury/illness/exacerbation, and 3+ comorbidities: depression, HLD, GERD, migraine, tremor, R lumpectomy, MCI  are also affecting patient's functional outcome.   REHAB POTENTIAL: Good  CLINICAL DECISION MAKING: Evolving/moderate complexity  EVALUATION  COMPLEXITY: Moderate  PLAN: PT FREQUENCY: 1-2x/week  PT DURATION: 6 weeks  PLANNED INTERVENTIONS: Therapeutic exercises, Therapeutic activity, Neuromuscular re-education, Balance training, Gait training, Patient/Family education, Self Care, Joint mobilization, Stair training, Vestibular training, Canalith repositioning, Aquatic Therapy, Dry Needling, Electrical stimulation, Cryotherapy, Moist heat, Taping, Manual therapy, and Re-evaluation  PLAN FOR NEXT SESSION: work on quick sharp turns, progress core strengthening, lumbopelvic stretching, ankle stretching, progress balance activities   Janene Harvey, PT, DPT 06/14/22 1:59 PM  [place] at [facility] [address] [place], [place] [address] Phone # [phone] Fax # [phone]

## 2022-06-14 ENCOUNTER — Encounter: Payer: Self-pay | Admitting: Physical Therapy

## 2022-06-14 ENCOUNTER — Ambulatory Visit: Payer: Medicare HMO | Admitting: Physical Therapy

## 2022-06-14 ENCOUNTER — Encounter: Payer: Medicare HMO | Admitting: Family Medicine

## 2022-06-14 DIAGNOSIS — G8929 Other chronic pain: Secondary | ICD-10-CM | POA: Diagnosis not present

## 2022-06-14 DIAGNOSIS — R448 Other symptoms and signs involving general sensations and perceptions: Secondary | ICD-10-CM | POA: Diagnosis not present

## 2022-06-14 DIAGNOSIS — R2689 Other abnormalities of gait and mobility: Secondary | ICD-10-CM

## 2022-06-14 DIAGNOSIS — G250 Essential tremor: Secondary | ICD-10-CM | POA: Diagnosis not present

## 2022-06-14 DIAGNOSIS — M545 Low back pain, unspecified: Secondary | ICD-10-CM

## 2022-06-14 DIAGNOSIS — R2681 Unsteadiness on feet: Secondary | ICD-10-CM

## 2022-06-14 DIAGNOSIS — R252 Cramp and spasm: Secondary | ICD-10-CM | POA: Diagnosis not present

## 2022-06-14 NOTE — Progress Notes (Signed)
Chief Complaint  Patient presents with   IFG    Nonfasting med check. Only concern is her feet and the numbness she is having. Back pain is improving with PT but has ongoing numbness in her feet and toes. Her balance is not very good. She has not had any recent falls. She takes 3 tylenol pm nightly for her insomnia. She is not smoking and she drink 2-3 times a with 1oz (about 2 finger worth).   Patient presents for follow-up on chronic problems. Her main concern is the numbness in her feet. She denies pain, feels like it affects her balance and walking. She has seen podiatry and neurology, and wonders what can be done about it.  Vitamin B12 was noted to be low in 05/2021 (271). She had level rechecked in 11/2021, when taking 1000 mcg daily, and level was normal/high.  She has chronic numbness/tingling in her feet, unchanged. Denies pain in her feet.  Lab Results  Component Value Date   [id number] 6,270 (H) 11/24/2021   Impaired fasting glucose: she continues to try and limit her sweets. She likes chocolate, but tries to limit portions.  Limits to 2 chocolate kisses daily. Occasional cookie (3-4/week). Limits rice, pasta, uses whole wheat bread. Doesn't eat a lot of starchy vegetables.  Alcohol--1 shot of Tia Maria about 2x/week. (2 shots of liquor daily per neuro, pt denies this). Last A1c was 5.8% in 11/2021.   Mixed hyperlipidemia:   She reports being compliant with her Crestor. Patient is reportedly following a low-fat, low cholesterol diet. Compliant with medications and denies medication side effects. Doesn't take fish oil, due to causing bad breath.  Lipids were last checked in February, TG slightly elevated, LDL good at 61.  Lab Results  Component Value Date   CHOL 157 11/24/2021   HDL 70 11/24/2021   LDLCALC 61 11/24/2021   TRIG 157 (H) 11/24/2021   CHOLHDL 2.2 11/24/2021   GERD--She takes Prilosec OTC daily, and denies symptoms. She has had recurrent heartburn if she missed  medication in the past, hasn't missed any recently.  Denies dysphagia.   Depression--she stopped Sertraline a few years ago, didn't have any recurrent depression after stopping (has had recurrent depression when stopped in the past, related to work stress), but she had problems with recurrent insomnia. OTC meds didn't help. She didn't tolerate trazadone due to nausea.  Sertraline was restarted at that time, at 64m, and insomnia had resolved.  She again started having sleep trouble. In 11/2021 she reported taking tylenol PM every night with good resuilts. She had been taking 2 pills.  A couple of months ago her sleep seem to worsen. She bumped the dose up to 3 Tylenol PM in the last couple of months, and this seems to help more. There is a strong family h/o suicide in her family (5 people), and because of this is not interested in trying to stop the sertraline again.  Last saw neuro 05/15/22 for f/u on balance problems, recurrent falls and memory loss. MMSE was 30/30, not on meds (had stopped Aricept due to vivid dreams, fell out of bed). Neuro's notes reported 2 shots of liquor per night, and tobacco odor on her clothing, though she denied smoking. Encouraged to cut back on alcohol, avoid smoking. She was referred for neuro-PT, which she has been getting. Neuro recommended repeating labs, she preferred to wait and get done at PCP (A1c, B12, TSH) She has been getting PT to help with balance and LBP.  Her upper and lower back pain is significantly improved since getting PT.  No change to the numbness in her feet. Apparently neuro suggested evaluation with a spine specialist, pt was told to discuss with PCP for a potential referral.   Tremor--Dr. Rexene Alberts did not want to restart clonazepam for fear of side effects, falls, risks. Tremor is stable/tolerable.  Tobacco use--she previously denied smoking to me (until she was in facility, which was non-smoking, and patches were requested).  Today she reports/admits  that she smokes occasionally.  She sometimes "feels like she needs one"--and buys a pack. Thinks maybe she smokes due to boredom.  Past due for colorectal screening. She never returned Cologuard. She states she recently got another stool collection kit that was different. She doesn't really want to do this, thinks it will be hard to get a sample.   PMH, PSH, SH reviewed  Outpatient Encounter Medications as of 06/15/2022  Medication Sig   Calcium Carbonate-Vitamin D 600-200 MG-UNIT TABS Take 600 mg by mouth 2 (two) times daily.   diphenhydramine-acetaminophen (TYLENOL PM) 25-500 MG TABS tablet Take 3 tablets by mouth at bedtime as needed (sleep).   omeprazole (PRILOSEC) 20 MG capsule Take 20 mg by mouth daily.   rosuvastatin (CRESTOR) 40 MG tablet TAKE 1 TABLET BY MOUTH EVERY DAY   sertraline (ZOLOFT) 50 MG tablet TAKE 1 TABLET BY MOUTH EVERY DAY   vitamin B-12 (CYANOCOBALAMIN) 1000 MCG tablet Take 1 tablet (1,000 mcg total) by mouth daily.   No facility-administered encounter medications on file as of 06/15/2022.   Allergies  Allergen Reactions   Trazodone And Nefazodone Nausea Only   Mysoline [Primidone] Nausea Only    Nausea and achey    ROS: Denies fever, chills, URI symptoms, headaches, shortness of breath, chest pain.  Denies nausea, vomiting, bowel changes, urinary complaints, bleeding, bruising, rash. Denies lightheadedness, vertigo, falls. No worsening of tremor. No change in numbness/tingling of feet Moods are good Insomnia?   PHYSICAL EXAM:  BP 128/80   Pulse 64   Ht 5' 4"  (1.626 m)   Wt 137 lb 6.4 oz (62.3 kg)   LMP 03/17/1999 (Approximate)   BMI 23.58 kg/m   Wt Readings from Last 3 Encounters:  06/15/22 137 lb 6.4 oz (62.3 kg)  05/15/22 136 lb (61.7 kg)  11/24/21 136 lb (61.7 kg)   Pleasant female, in no distress. HEENT: conjunctiva and sclera are clear, EOMI.  Neck: no lymphadenopathy or mass, no bruit. Heart: regular rate and rhythm Lungs: clear  bilaterally Abdomen: soft, nontender Extremities: no edema Back: no spinal or CVA tenderness.  Some kyphosis/poor posture with slouching forward. Neuro: alert, oriented. Walking without assistance, normal gait. Cranial nerves grossly iintact.  Normal speech.  No head bobbing, minimal tremor. Decreased sensation on feet. Hyper-reflexic throughout, upper and lower extremities. Psych: normal mood, affect, hygiene and grooming.  Lab Results  Component Value Date   HGBA1C 5.8 (A) 06/15/2022    ASSESSMENT/PLAN:  Impaired fasting glucose - stable. counseled re: diet, exercise, weight loss - Plan: HgB A1c  Depression, major, in remission ([place]) - depression is in remission, but insomnia has been worse, previously responded to zoloft. Will increase zoloft dose to see if helps with sleep  Mixed hyperlipidemia - cont statin, lowfat, low cholesterol diet reviewed  Benign essential tremor - stable, not bad enough for treatment--not a good candidate to restart clonazepam, and had issues with other meds. Sees neuro  B12 deficiency - Plan: Vitamin B12  Gastroesophageal reflux disease, unspecified whether  esophagitis present  Numbness in feet - unclear etiology. Recheck B12 and TSH. A1c okay. 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  cognitive impairment has improved  Mild cognitive impairment - This has improved. Encouraged to limit alcohol - Plan: TSH  Hyperreflexia - Plan: TSH  Tobacco use - counseled on risks, alternatives to smoking when bored. Strongly encouraged complete cessation.  Insomnia, unspecified type - no longer c/b sertraline, Tylenol PM--requiring higher doses than rec. Will try increasing sertraline dose. Consider Belsomra if ineffective  Discussed colorectal screening--likely has a kit sent by her insurance, since she is past due.  Advised her to keep it in the bathroom, ready for when she has a bowel  movement. Encouraged her to return kit as directed.  Pt prefers not to see another doc (spine doc), as had been recommended by neuro. We discussed that I would send a note to Jessica/Dr. Rexene Alberts, asking about reason for spine doc--if for back pain, likely not needed, since her back pain is better with PT. Main issue is concern over ongoing numbness in feet (which potentially could come from back). ?need for EMG/NCV through neuro.  Counseled re: recommended vaccines in detail--flu, COVID booster, RSV.  F/u 6 mos for AWV/CPE (fasting)  I spent 46 minutes dedicated to the care of this patient, including pre-visit review of records, face to face time, post-visit ordering of testing and documentation.   Let's try increasing the sertraline dose and see how this helps with your sleep. I recommend taking 1.5 tablets every night for 1-2 weeks, and if not better, then you can increase to 2 pills, and we can then change the prescription to 185m tablet. Let me know which dose is effective as I will need to change your prescription when you need more (quantity if staying on the 518mtablets, to accommodate the higher dose, or switching to a higher dose tablet). Let me know this before you need this refilled again, the end of September.  Please don't take more than 2 Tylenol PM at one time. Hopefully you won't need this every night if the higher dose of sertraline is effective.

## 2022-06-14 NOTE — Patient Instructions (Incomplete)
Please get high dose flu shot in September. I recommend that you get the new RSV vaccine--you will need to get this from the pharmacy (covered by Medicare Part D).  You likely can get this the same day as your flu shot.  I recommend getting the updated COVID booster when it becomes available this Fall (needs to be separate from other vaccines by at least 2 weeks).   Let's try increasing the sertraline dose and see how this helps with your sleep. I recommend taking 1.5 tablets every night for 1-2 weeks, and if not better, then you can increase to 2 pills, and we can then change the prescription to 100mg  tablet. Let me know which dose is effective as I will need to change your prescription when you need more (quantity if staying on the 50mg  tablets, to accommodate the higher dose, or switching to a higher dose tablet). Let me know this before you need this refilled again, the end of September.  Please don't take more than 2 Tylenol PM at one time. Hopefully you won't need this every night if the higher dose of sertraline is effective.  Please try and return the colorectal cancer screening test that you received in the mail recently (likely through your insurance).

## 2022-06-15 ENCOUNTER — Encounter: Payer: Self-pay | Admitting: Family Medicine

## 2022-06-15 ENCOUNTER — Ambulatory Visit (INDEPENDENT_AMBULATORY_CARE_PROVIDER_SITE_OTHER): Payer: Medicare HMO | Admitting: Family Medicine

## 2022-06-15 VITALS — BP 128/80 | HR 64 | Ht 64.0 in | Wt 137.4 lb

## 2022-06-15 DIAGNOSIS — G3184 Mild cognitive impairment, so stated: Secondary | ICD-10-CM | POA: Diagnosis not present

## 2022-06-15 DIAGNOSIS — K219 Gastro-esophageal reflux disease without esophagitis: Secondary | ICD-10-CM

## 2022-06-15 DIAGNOSIS — Z720 Tobacco use: Secondary | ICD-10-CM | POA: Diagnosis not present

## 2022-06-15 DIAGNOSIS — R7301 Impaired fasting glucose: Secondary | ICD-10-CM

## 2022-06-15 DIAGNOSIS — R292 Abnormal reflex: Secondary | ICD-10-CM

## 2022-06-15 DIAGNOSIS — E782 Mixed hyperlipidemia: Secondary | ICD-10-CM | POA: Diagnosis not present

## 2022-06-15 DIAGNOSIS — E538 Deficiency of other specified B group vitamins: Secondary | ICD-10-CM | POA: Diagnosis not present

## 2022-06-15 DIAGNOSIS — R200 Anesthesia of skin: Secondary | ICD-10-CM

## 2022-06-15 DIAGNOSIS — G4700 Insomnia, unspecified: Secondary | ICD-10-CM | POA: Diagnosis not present

## 2022-06-15 DIAGNOSIS — G250 Essential tremor: Secondary | ICD-10-CM | POA: Diagnosis not present

## 2022-06-15 DIAGNOSIS — F325 Major depressive disorder, single episode, in full remission: Secondary | ICD-10-CM | POA: Diagnosis not present

## 2022-06-15 DIAGNOSIS — G319 Degenerative disease of nervous system, unspecified: Secondary | ICD-10-CM | POA: Diagnosis not present

## 2022-06-15 DIAGNOSIS — R69 Illness, unspecified: Secondary | ICD-10-CM | POA: Diagnosis not present

## 2022-06-15 LAB — POCT GLYCOSYLATED HEMOGLOBIN (HGB A1C): Hemoglobin A1C: 5.8 % — AB (ref 4.0–5.6)

## 2022-06-16 ENCOUNTER — Encounter: Payer: Self-pay | Admitting: Family Medicine

## 2022-06-16 LAB — VITAMIN B12: Vitamin B-12: 1358 pg/mL — ABNORMAL HIGH (ref 232–1245)

## 2022-06-16 LAB — TSH: TSH: 1.87 u[IU]/mL (ref 0.450–4.500)

## 2022-06-16 NOTE — Progress Notes (Signed)
Lindsey Frank/Dr. Athar--FYI these are the labs that were requested to be repeated (all okay).  I saw the patient yesterday and she reported that her neck and back pain were doing much better with the physical therapy.  I read in her chart that you recommended that she get referral to a spine specialist from PCP (me).  I wasn't sure if this was related to her pain (which is improving) vs the neuropathy.  Her chief concern is the ongoing numbness in feet (which potentially could come from back, and affects her balance). She is seeking answers about this. Would it make sense for your office to do NCV/EMG to evaluate her numbness? I just want some clarification before doing any referral.  Thanks in advance.

## 2022-06-20 ENCOUNTER — Ambulatory Visit: Payer: Medicare HMO | Attending: Adult Health | Admitting: Physical Therapy

## 2022-06-20 ENCOUNTER — Encounter: Payer: Self-pay | Admitting: Physical Therapy

## 2022-06-20 DIAGNOSIS — M545 Low back pain, unspecified: Secondary | ICD-10-CM | POA: Insufficient documentation

## 2022-06-20 DIAGNOSIS — G8929 Other chronic pain: Secondary | ICD-10-CM | POA: Insufficient documentation

## 2022-06-20 DIAGNOSIS — R2681 Unsteadiness on feet: Secondary | ICD-10-CM | POA: Diagnosis not present

## 2022-06-20 DIAGNOSIS — R2689 Other abnormalities of gait and mobility: Secondary | ICD-10-CM | POA: Diagnosis not present

## 2022-06-20 DIAGNOSIS — R252 Cramp and spasm: Secondary | ICD-10-CM | POA: Insufficient documentation

## 2022-06-20 NOTE — Therapy (Signed)
OUTPATIENT PHYSICAL THERAPY NEURO TREATMENT   Patient Name: Lindsey Frank MRN: [id number] DOB:07/25/1946, 76 y.o., female Today's Date: 06/20/2022   PCP: [name], MD REFERRING PROVIDER: Ihor Austin, NP   PT End of Session - 06/20/22 1509     Visit Number 7    Number of Visits 13    Date for PT Re-Evaluation 07/11/22    Authorization Type [employer] (10th Visit PN)    PT Start Time 1404    PT Stop Time 1442    PT Time Calculation (min) 38 min    Equipment Utilized During Treatment Gait belt    Activity Tolerance Patient tolerated treatment well    Behavior During Therapy WFL for tasks assessed/performed                   Past Medical History:  Diagnosis Date   Abnormal Pap smear of cervix 2019   HR HPV + recurrent    Depression    Elevated C-reactive protein (CRP) 01/2005   Elevated cholesterol 01/2005   Elevated triglycerides with high cholesterol 01/2005   GERD (gastroesophageal reflux disease)    History of endometriosis    Migraine    Tremor, essential    Past Surgical History:  Procedure Laterality Date   BREAST LUMPECTOMY Right 1992   small lumpectomy for benign tumor   CATARACT EXTRACTION Left 01/02/2018   Dr. 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  Pt accompanied by: self  PERTINENT HISTORY: depression, HLD, GERD, migraine, tremor, R lumpectomy, MCI  PAIN:  Are you having pain? No   PRECAUTIONS: Fall   PATIENT GOALS  "get this numbness gone and the back pain"  OBJECTIVE:    TODAY'S TREATMENT: 06/20/2022 Activity Comments  NuStep, Level 3, 5 minutes, 4 extremities Cues to keep speed 70-80 SPM, maintains good posture  Anterior/posterior pelvic tilts x 8 reps edge of mat; then forward flexed posture>upright posture, x 5 reps   Figure-8 turns with gait, around cones, 2 reps each direction   Alternating step taps 2 x 10 reps on solid surface, to 6" step, then on foam surface x 10 reps to 6" step Cues for increased foot clearance, to slow pace and for intermittent UE support as needed.  Rockerboard in ant/posterior directions, using ankle and hip strategy, 2 x 10 reps; transition to standing midline and cues to use ankle strategy to regain balance Several episodes of needing to reach with UEs to regain balance  Reviewed Romberg EC 2 x 30 sec hands hovering at counter Mild sway  Forward/back walking at counter 5 reps Cues to hinge at hips for improved stability in posterior direction  Sidestepping R and L x 3 reps   wide BOS lateral stretch through hips, x 10 reps, then stagger stance forward/back weightshifting 2 x 5 reps Cues for technique to end with stretch through hips and ankles         HOME EXERCISE PROGRAM Last updated: 06/12/22 Access Code: [email or web address] URL: [email or web address] Date: 06/12/2022 Prepared by: [facility] - Outpatient  Rehab - [facility]  Exercises - Gastroc Stretch on Wall  - 1 x daily - 5 x weekly - 2 sets - 10 reps - 30 sec hold - Standing Anterior Tibialis Stretch  - 1 x daily - 5 x weekly - 2 sets - 10 reps - 30 sec hold - Heel Toe Raises with Counter Support  - 1 x daily - 5 x weekly - 2 sets - 10 reps - Standing Toe Taps  - 1 x daily - 5 x weekly - 2 sets - 10 reps - Narrow Stance with Counter Support  - 1 x daily - 5 x weekly - 2 sets - 30 sec hold - Seated Pelvic Tilt  - 1 x daily - 5 x weekly - 2 sets - 10 reps - Standing Shoulder Row with Anchored Resistance  - 1 x daily - 5 x weekly - 2 sets - 10 reps - Shoulder Extension with Resistance - Palms Forward  - 1 x daily - 5 x weekly - 2 sets - 10 reps - Romberg Stance with Eyes Closed  - 1 x daily - 5 x weekly - 2-3 sets - 30 sec hold    Below measures were taken at time of initial evaluation unless otherwise specified:  DIAGNOSTIC FINDINGS: none recent  COGNITION: Overall cognitive status: No family/caregiver present to determine baseline cognitive functioning   SENSATION: WFL; c/o L side slightly decreased sensation  COORDINATION: Intact B alt pronation/supination and alt toe tap   POSTURE: rounded shoulders, forward head, and increased thoracic kyphosis  LOWER EXTREMITY ROM:     Active  Right Eval Left Eval  Hip flexion    Hip extension    Hip abduction    Hip adduction    Hip internal rotation    Hip external rotation  Knee flexion    Knee extension    Ankle dorsiflexion 10 10  Ankle plantarflexion    Ankle inversion    Ankle eversion     (Blank rows = not tested)  LOWER EXTREMITY MMT:    MMT Right Eval Left Eval  Hip flexion 4+ 5  Hip extension    Hip abduction 4 3+  Hip adduction 4 3+  Hip internal rotation    Hip external  rotation    Knee flexion 5 5  Knee extension 5 4+  Ankle dorsiflexion    Ankle plantarflexion 4 (5 reps with compensations) 4 (3 reps with compensations)  Ankle inversion 4+ 4  Ankle eversion 4 4+  (Blank rows = not tested)   GAIT: Gait pattern:  foot flat at  heel strike with B short step length, trunk and neck flexed throughout Assistive device utilized: None   Active ROM 06/01/22  Lumbar flexion Ankles   Lumbar extension 50% limited   Lumbar R SBing Proximal tibia  Lumbar L SBing Jt line  Lumbar R rotation  25% limited   Lumbar L rotation normal  *c/o no LBP, but some "nagging pain in upper back"       PATIENT EDUCATION: Education details: prognosis, POC, HEP Person educated: Patient Education method: Explanation, Actor cues, Verbal cues, and Handouts Education comprehension: verbalized understanding   HOME EXERCISE PROGRAM: Access Code: [email or web address] URL: [email or web address] Date: 05/30/2022 Prepared by: [facility] - Outpatient  Rehab - [facility]  Exercises - Gastroc Stretch on Wall  - 1 x daily - 5 x weekly - 2 sets - 10 reps - 30 sec hold - Standing Anterior Tibialis Stretch  - 1 x daily - 5 x weekly - 2 sets - 10 reps - 30 sec hold - Heel Toe Raises with Counter Support  - 1 x daily - 5 x weekly - 2 sets - 10 reps - Standing Toe Taps  - 1 x daily - 5 x weekly - 2 sets - 10 reps - Narrow Stance with Counter Support  - 1 x daily - 5 x weekly - 2 sets - 30 sec hold    GOALS: Goals reviewed with patient? Yes  SHORT TERM GOALS: Target date: 06/20/2022  Patient to be independent with initial HEP. Baseline: HEP initiated Goal status: INITIAL    LONG TERM GOALS: Target date: 07/11/2022  Patient to be independent with advanced HEP. Baseline: Not yet initiated  Goal status: IN PROGRESS  Patient to demonstrate B LE strength >/=4+/5.  Baseline: See above Goal status: IN PROGRESS  Patient to demonstrate B ankle dorsiflexion AROM to  atleast 14 degrees to improve gait mechanics. Baseline: 10 deg B Goal status: IN PROGRESS  Patient to demonstrate alternating reciprocal pattern when ascending and descending stairs with good stability and 1 handrail as needed.   Baseline: Unable Goal status: IN PROGRESS  Patient to score at least 20/24 on DGI in order to decrease risk of falls.  Baseline: 15/24 Goal status: IN PROGRESS  Patient to demonstrate safe ambulation with LRAD on outdoor surfaces such as sidewalk, hills, and grass with good stability. Baseline: NT Goal status: IN PROGRESS  Patient to demonstrate safe kneeling/bending to simulate gardening activities without back pain limiting.  Baseline: reports pain with gardening  Goal status: IN PROGRESS   ASSESSMENT:  CLINICAL IMPRESSION: Patient arrives today reporting having gotten new sneakers, which she notes are offering more lateral support to R foot.  Addressed balance and posture today in PT  session, with pt continueing to have some balance challenges in posterior direction.  She feels the shoes are helping with stability, and she does seem balanced with wider BOS with figure-8 turns today.  With ankle strategy work on rockerboard, she requires UE support to regain balance from posterior direction.  She will continue to benefit from skilled PT towards goals for improved overall functional mobility and decreased fall risk.  OBJECTIVE IMPAIRMENTS Abnormal gait, decreased activity tolerance, decreased balance, decreased strength, increased muscle spasms, impaired flexibility, impaired sensation, improper body mechanics, postural dysfunction, and pain.   ACTIVITY LIMITATIONS carrying, lifting, bending, standing, squatting, stairs, transfers, bathing, toileting, dressing, reach over head, and hygiene/grooming  PARTICIPATION LIMITATIONS: meal prep, cleaning, laundry, driving, shopping, community activity, yard work, and church  PERSONAL FACTORS Age, Behavior pattern,  Fitness, Past/current experiences, Time since onset of injury/illness/exacerbation, and 3+ comorbidities: depression, HLD, GERD, migraine, tremor, R lumpectomy, MCI  are also affecting patient's functional outcome.   REHAB POTENTIAL: Good  CLINICAL DECISION MAKING: Evolving/moderate complexity  EVALUATION COMPLEXITY: Moderate  PLAN: PT FREQUENCY: 1-2x/week  PT DURATION: 6 weeks  PLANNED INTERVENTIONS: Therapeutic exercises, Therapeutic activity, Neuromuscular re-education, Balance training, Gait training, Patient/Family education, Self Care, Joint mobilization, Stair training, Vestibular training, Canalith repositioning, Aquatic Therapy, Dry Needling, Electrical stimulation, Cryotherapy, Moist heat, Taping, Manual therapy, and Re-evaluation  PLAN FOR NEXT SESSION: Continue to work on quick sharp turns, progress core strengthening, lumbopelvic stretching, ankle stretching, progress balance activities   Mady Haagensen, PT 06/20/22 3:11 PM Phone: [phone] Fax: [phone]  [place] [facility] [address], [place], [place] [address] Phone # [phone] Fax # [phone]

## 2022-06-21 ENCOUNTER — Encounter: Payer: Self-pay | Admitting: Internal Medicine

## 2022-06-21 NOTE — Therapy (Signed)
OUTPATIENT PHYSICAL THERAPY NEURO TREATMENT   Patient Name: Lindsey Frank MRN: [id number] DOB:[birth year], 76 y.o., female Today's Date: 06/22/2022   PCP: [name], MD REFERRING PROVIDER: Ihor Austin, NP   PT End of Session - 06/22/22 1442     Visit Number 8    Number of Visits 13    Date for PT Re-Evaluation 07/11/22    Authorization Type [employer] Medicare (10th Visit PN)    PT Start Time 1405    PT Stop Time 1443    PT Time Calculation (min) 38 min    Equipment Utilized During Treatment Gait belt    Activity Tolerance Patient tolerated treatment well    Behavior During Therapy WFL for tasks assessed/performed                    Past Medical History:  Diagnosis Date   Abnormal Pap smear of cervix 2019   HR HPV + recurrent    Depression    Elevated C-reactive protein (CRP) 01/2005   Elevated cholesterol 01/2005   Elevated triglycerides with high cholesterol 01/2005   GERD (gastroesophageal reflux disease)    History of endometriosis    Migraine    Tremor, essential    Past Surgical History:  Procedure Laterality Date   BREAST LUMPECTOMY Right 1992   small lumpectomy for benign tumor   CATARACT EXTRACTION Left 01/02/2018   Dr. Dagoberto Ligas   COSMETIC SURGERY  2008   neck lift   LAPAROSCOPY  1981   endometriosis   TONSILECTOMY, ADENOIDECTOMY, BILATERAL MYRINGOTOMY AND TUBES  1953   age 76   UTERINE FIBROID SURGERY  1977   with appendectomy   Patient Active Problem List   Diagnosis Date Noted   Impaired fasting glucose 07/04/2016   Depression, major, in remission (HCC) 11/17/2013   GERD (gastroesophageal reflux disease) 05/14/2013   Tobacco use disorder 04/24/2012   Mixed hyperlipidemia 10/26/2011   Pure hyperglyceridemia 03/30/2011   Depressive disorder, not elsewhere classified 03/30/2011   Benign essential tremor 02/27/2011   Pure hypercholesterolemia 02/27/2011   Migraine     ONSET DATE: August 2022  REFERRING DIAG:  R44.8 (ICD-10-CM)  - Sensory impairment R26.89 (ICD-10-CM) - Abnormality of gait due to impairment of balance G25.0 (ICD-10-CM) - Essential tremor  THERAPY DIAG:  Unsteadiness on feet  Other abnormalities of gait and mobility  Chronic bilateral low back pain without sciatica  Cramp and spasm  Rationale for Evaluation and Treatment Rehabilitation  SUBJECTIVE:  SUBJECTIVE STATEMENT: L arm was hurting a little after using the warm up machine. Have been pretty much pain-free otherwise.  Pt accompanied by: self  PERTINENT HISTORY: depression, HLD, GERD, migraine, tremor, R lumpectomy, MCI  PAIN:  Are you having pain? No   PRECAUTIONS: Fall   PATIENT GOALS  "get this numbness gone and the back pain"  OBJECTIVE:     TODAY'S TREATMENT: 06/22/22 Activity Comments  Nustep L4 x 6 min Les only  Maintaining 70-80SPM  g[place]    4 square step CW/CCW CGA and cueing to decrease speed   gait training outside on grass and sidewalk with pivot turn and head turns/nods x563ft Mild imbalance; c/o some LBP  single leg heel raise 2x5 each  Cues to correct knee flexion; shaking d/t fatigue/weakness   rockerboard   resisted backwards walking with pulley 5x 10# Cues to widen BOS and decrease speed   resisted sidestepping with pulley 10# 3x5 steps each side Cues to control steps back to the machine      HOME EXERCISE PROGRAM Last updated: 06/22/22 Access Code: [email or web address] URL: [email or web address] Date: 06/22/2022 Prepared by: [facility] - Outpatient  Rehab - [facility]  Exercises - Gastroc Stretch on Wall  - 1 x daily - 5 x weekly - 2 sets - 10 reps - 30 sec hold - Standing Anterior Tibialis Stretch  - 1 x daily - 5 x weekly - 2 sets - 10 reps - 30 sec hold - Standing Toe Taps  - 1 x daily - 5 x  weekly - 2 sets - 10 reps - Narrow Stance with Counter Support  - 1 x daily - 5 x weekly - 2 sets - 30 sec hold - Seated Pelvic Tilt  - 1 x daily - 5 x weekly - 2 sets - 10 reps - Standing Shoulder Row with Anchored Resistance  - 1 x daily - 5 x weekly - 2 sets - 10 reps - Shoulder Extension with Resistance - Palms Forward  - 1 x daily - 5 x weekly - 2 sets - 10 reps - Romberg Stance with Eyes Closed  - 1 x daily - 5 x weekly - 2-3 sets - 30 sec hold - Single Leg Heel Raise with Counter Support  - 1 x daily - 5 x weekly - 2 sets - 5 reps    PATIENT EDUCATION: Education details: HEP update Person educated: Patient Education method: Explanation, Demonstration, Tactile cues, Verbal cues, and Handouts Education comprehension: verbalized understanding and returned demonstration   Below measures were taken at time of initial evaluation unless otherwise specified:  DIAGNOSTIC FINDINGS: none recent  COGNITION: Overall cognitive status: No family/caregiver present to determine baseline cognitive functioning   SENSATION: WFL; c/o L side slightly decreased sensation  COORDINATION: Intact B alt pronation/supination and alt toe tap   POSTURE: rounded shoulders, forward head, and increased thoracic kyphosis  LOWER EXTREMITY ROM:     Active  Right Eval Left Eval  Hip flexion    Hip extension    Hip abduction    Hip adduction    Hip internal rotation    Hip external rotation    Knee flexion    Knee extension    Ankle dorsiflexion 10 10  Ankle plantarflexion    Ankle inversion    Ankle eversion     (Blank rows = not tested)  LOWER EXTREMITY MMT:    MMT Right Eval Left Eval  Hip flexion 4+ 5  Hip extension  Hip abduction 4 3+  Hip adduction 4 3+  Hip internal rotation    Hip external rotation    Knee flexion 5 5  Knee extension 5 4+  Ankle dorsiflexion    Ankle plantarflexion 4 (5 reps with compensations) 4 (3 reps with compensations)  Ankle inversion 4+ 4  Ankle  eversion 4 4+  (Blank rows = not tested)   GAIT: Gait pattern:  foot flat at  heel strike with B short step length, trunk and neck flexed throughout Assistive device utilized: None   Active ROM 06/01/22  Lumbar flexion Ankles   Lumbar extension 50% limited   Lumbar R SBing Proximal tibia  Lumbar L SBing Jt line  Lumbar R rotation  25% limited   Lumbar L rotation normal  *c/o no LBP, but some "nagging pain in upper back"       PATIENT EDUCATION: Education details: prognosis, POC, HEP Person educated: Patient Education method: Explanation, Actor cues, Verbal cues, and Handouts Education comprehension: verbalized understanding   HOME EXERCISE PROGRAM: Access Code: [email or web address] URL: [email or web address] Date: 05/30/2022 Prepared by: [facility] - Outpatient  Rehab - [facility]  Exercises - Gastroc Stretch on Wall  - 1 x daily - 5 x weekly - 2 sets - 10 reps - 30 sec hold - Standing Anterior Tibialis Stretch  - 1 x daily - 5 x weekly - 2 sets - 10 reps - 30 sec hold - Heel Toe Raises with Counter Support  - 1 x daily - 5 x weekly - 2 sets - 10 reps - Standing Toe Taps  - 1 x daily - 5 x weekly - 2 sets - 10 reps - Narrow Stance with Counter Support  - 1 x daily - 5 x weekly - 2 sets - 30 sec hold    GOALS: Goals reviewed with patient? Yes  SHORT TERM GOALS: Target date: 06/20/2022  Patient to be independent with initial HEP. Baseline: HEP initiated Goal status: INITIAL    LONG TERM GOALS: Target date: 07/11/2022  Patient to be independent with advanced HEP. Baseline: Not yet initiated  Goal status: IN PROGRESS  Patient to demonstrate B LE strength >/=4+/5.  Baseline: See above Goal status: IN PROGRESS  Patient to demonstrate B ankle dorsiflexion AROM to atleast 14 degrees to improve gait mechanics. Baseline: 10 deg B Goal status: IN PROGRESS  Patient to demonstrate alternating reciprocal pattern when ascending and descending stairs with  good stability and 1 handrail as needed.   Baseline: Unable Goal status: IN PROGRESS  Patient to score at least 20/24 on DGI in order to decrease risk of falls.  Baseline: 15/24 Goal status: IN PROGRESS  Patient to demonstrate safe ambulation with LRAD on outdoor surfaces such as sidewalk, hills, and grass with good stability. Baseline: NT Goal status: IN PROGRESS  Patient to demonstrate safe kneeling/bending to simulate gardening activities without back pain limiting.  Baseline: reports pain with gardening  Goal status: IN PROGRESS   ASSESSMENT:  CLINICAL IMPRESSION: Patient arrived to session with report of some L arm pain after Nustep warm up last session, thus performed without handles today. Gait training on outdoor surfaces was completed with additional balance challenges. Patient demonstrated mild imbalance and was limited by back pain. Proceeded with dynamic balance activities and ankle strengthening with weakness and imbalance evident. Patient required cueing to decrease speed of movement to improve safety with stepping activities.  Patient reported understanding of all edu provided and without complaints at end of session.  OBJECTIVE IMPAIRMENTS Abnormal gait, decreased activity tolerance, decreased balance, decreased strength, increased muscle spasms, impaired flexibility, impaired sensation, improper body mechanics, postural dysfunction, and pain.   ACTIVITY LIMITATIONS carrying, lifting, bending, standing, squatting, stairs, transfers, bathing, toileting, dressing, reach over head, and hygiene/grooming  PARTICIPATION LIMITATIONS: meal prep, cleaning, laundry, driving, shopping, community activity, yard work, and church  PERSONAL FACTORS Age, Behavior pattern, Fitness, Past/current experiences, Time since onset of injury/illness/exacerbation, and 3+ comorbidities: depression, HLD, GERD, migraine, tremor, R lumpectomy, MCI  are also affecting patient's functional outcome.    REHAB POTENTIAL: Good  CLINICAL DECISION MAKING: Evolving/moderate complexity  EVALUATION COMPLEXITY: Moderate  PLAN: PT FREQUENCY: 1-2x/week  PT DURATION: 6 weeks  PLANNED INTERVENTIONS: Therapeutic exercises, Therapeutic activity, Neuromuscular re-education, Balance training, Gait training, Patient/Family education, Self Care, Joint mobilization, Stair training, Vestibular training, Canalith repositioning, Aquatic Therapy, Dry Needling, Electrical stimulation, Cryotherapy, Moist heat, Taping, Manual therapy, and Re-evaluation  PLAN FOR NEXT SESSION: Continue to work on quick sharp turns, progress core strengthening, lumbopelvic stretching, ankle stretching, progress balance activities   Anette Guarneri, PT, DPT 06/22/22 2:46 PM  [place] at [facility] [address] [place], [place] [address] Phone # [phone] Fax # [phone]

## 2022-06-22 ENCOUNTER — Encounter: Payer: Self-pay | Admitting: Physical Therapy

## 2022-06-22 ENCOUNTER — Telehealth: Payer: Self-pay | Admitting: *Deleted

## 2022-06-22 ENCOUNTER — Ambulatory Visit: Payer: Medicare HMO | Admitting: Physical Therapy

## 2022-06-22 DIAGNOSIS — R2689 Other abnormalities of gait and mobility: Secondary | ICD-10-CM | POA: Diagnosis not present

## 2022-06-22 DIAGNOSIS — R252 Cramp and spasm: Secondary | ICD-10-CM

## 2022-06-22 DIAGNOSIS — G8929 Other chronic pain: Secondary | ICD-10-CM | POA: Diagnosis not present

## 2022-06-22 DIAGNOSIS — R2681 Unsteadiness on feet: Secondary | ICD-10-CM

## 2022-06-22 DIAGNOSIS — M545 Low back pain, unspecified: Secondary | ICD-10-CM | POA: Diagnosis not present

## 2022-06-22 NOTE — Telephone Encounter (Signed)
Called patient to let her know that Dr. Lynelle Doctor reached out to neuro. Dr. Frances Furbish states that since back is improving, no need for referral to spine doc is needed.  Dr. Lynelle Doctor also had asked about EMG/NCV testing, to further evaluate her numbness, but they don't currently have that available at their office.  That may be something for [place] to consider in the future, if any worsening neuropathy. For now, no changes in care.  The additional tests ruled out other etiologies for neuropathy.  Patient verbalized understanding.

## 2022-06-27 ENCOUNTER — Ambulatory Visit: Payer: Medicare HMO | Admitting: Physical Therapy

## 2022-06-27 ENCOUNTER — Encounter: Payer: Self-pay | Admitting: Physical Therapy

## 2022-06-27 DIAGNOSIS — R2681 Unsteadiness on feet: Secondary | ICD-10-CM

## 2022-06-27 DIAGNOSIS — R2689 Other abnormalities of gait and mobility: Secondary | ICD-10-CM

## 2022-06-27 DIAGNOSIS — M545 Low back pain, unspecified: Secondary | ICD-10-CM | POA: Diagnosis not present

## 2022-06-27 DIAGNOSIS — R252 Cramp and spasm: Secondary | ICD-10-CM | POA: Diagnosis not present

## 2022-06-27 DIAGNOSIS — G8929 Other chronic pain: Secondary | ICD-10-CM | POA: Diagnosis not present

## 2022-06-27 NOTE — Therapy (Signed)
OUTPATIENT PHYSICAL THERAPY NEURO TREATMENT   Patient Name: Lindsey Frank MRN: [id number] DOB:1946/06/17, 76 y.o., female Today's Date: 06/27/2022   PCP: [name], MD REFERRING PROVIDER: Ihor Austin, NP   PT End of Session - 06/27/22 1359     Visit Number 9    Number of Visits 13    Date for PT Re-Evaluation 07/11/22    Authorization Type [employer]na Medicare (10th Visit PN)    PT Start Time 1316    PT Stop Time 1357    PT Time Calculation (min) 41 min    Equipment Utilized During Treatment Gait belt    Activity Tolerance Patient tolerated treatment well    Behavior During Therapy WFL for tasks assessed/performed                     Past Medical History:  Diagnosis Date   Abnormal Pap smear of cervix 2019   HR HPV + recurrent    Depression    Elevated C-reactive protein (CRP) 01/2005   Elevated cholesterol 01/2005   Elevated triglycerides with high cholesterol 01/2005   GERD (gastroesophageal reflux disease)    History of endometriosis    Migraine    Tremor, essential    Past Surgical History:  Procedure Laterality Date   BREAST LUMPECTOMY Right 1992   small lumpectomy for benign tumor   CATARACT EXTRACTION Left 01/02/2018   Dr. Dagoberto Ligas   COSMETIC SURGERY  2008   neck lift   LAPAROSCOPY  1981   endometriosis   TONSILECTOMY, ADENOIDECTOMY, BILATERAL MYRINGOTOMY AND TUBES  1953   age 63   UTERINE FIBROID SURGERY  1977   with appendectomy   Patient Active Problem List   Diagnosis Date Noted   Impaired fasting glucose 07/04/2016   Depression, major, in remission (HCC) 11/17/2013   GERD (gastroesophageal reflux disease) 05/14/2013   Tobacco use disorder 04/24/2012   Mixed hyperlipidemia 10/26/2011   Pure hyperglyceridemia 03/30/2011   Depressive disorder, not elsewhere classified 03/30/2011   Benign essential tremor 02/27/2011   Pure hypercholesterolemia 02/27/2011   Migraine     ONSET DATE: August 2022  REFERRING DIAG:  R44.8  (ICD-10-CM) - Sensory impairment R26.89 (ICD-10-CM) - Abnormality of gait due to impairment of balance G25.0 (ICD-10-CM) - Essential tremor  THERAPY DIAG:  Unsteadiness on feet  Other abnormalities of gait and mobility  Chronic bilateral low back pain without sciatica  Cramp and spasm  Rationale for Evaluation and Treatment Rehabilitation  SUBJECTIVE:  SUBJECTIVE STATEMENT: Not much new. This AM the lower L side of the back was hurting. Trying practice walking with a better posture. Feels like balance and pain are about the same. Reports remaining N/T in her feet but notes resolution of cramps.  Pt accompanied by: self  PERTINENT HISTORY: depression, HLD, GERD, migraine, tremor, R lumpectomy, MCI  PAIN:  Are you having pain? No   PRECAUTIONS: Fall   PATIENT GOALS  "get this numbness gone and the back pain"  OBJECTIVE:    TODAY'S TREATMENT: 06/27/22 Activity Comments  Nustep L5 x 6 min UE/Les  Cues to maintain 60-70 SPM; continued with R UE only d/t report of L triceps ache  horizontal ABD TB with yellow TB 2x10 Cues for scap retraction and maintaining shoulders at 90 deg   shoulder ER TB 2x10 Good form; cues to look ahead  alt heel raise 2x20 Difficulty coordinating- improved with verbal cueing and demo  single leg heel raise 5x each More difficulty on L LE  Runner's gastroc stretch 30"   Cervical extension into ball 10x3", cervical retraction into ball 10x3" Demo on proper form; cues for light effort; c/o mild LBP  walking on toes/heels 4x along II bars each Cues for posterior pelvic tilt to correct posterior sway  standing on foam toe tap to 1st, 2nd step, down 10x each CGA; cues for tight core  step up on bosu 5x each  B UE support    PATIENT EDUCATION: Education details:  discussion encouraging [employer] or silver sne[employer] participation for continued fitness Person educated: Patient Education method: Explanation, Demonstration, Tactile cues, and Verbal cues Education comprehension: verbalized understanding    HOME EXERCISE PROGRAM Last updated: 06/22/22 Access Code: [email or web address] URL: [email or web address] Date: 06/22/2022 Prepared by: [facility] - Outpatient  Rehab - [facility]  Exercises - Gastroc Stretch on Wall  - 1 x daily - 5 x weekly - 2 sets - 10 reps - 30 sec hold - Standing Anterior Tibialis Stretch  - 1 x daily - 5 x weekly - 2 sets - 10 reps - 30 sec hold - Standing Toe Taps  - 1 x daily - 5 x weekly - 2 sets - 10 reps - Narrow Stance with Counter Support  - 1 x daily - 5 x weekly - 2 sets - 30 sec hold - Seated Pelvic Tilt  - 1 x daily - 5 x weekly - 2 sets - 10 reps - Standing Shoulder Row with Anchored Resistance  - 1 x daily - 5 x weekly - 2 sets - 10 reps - Shoulder Extension with Resistance - Palms Forward  - 1 x daily - 5 x weekly - 2 sets - 10 reps - Romberg Stance with Eyes Closed  - 1 x daily - 5 x weekly - 2-3 sets - 30 sec hold - Single Leg Heel Raise with Counter Support  - 1 x daily - 5 x weekly - 2 sets - 5 reps  Below measures were taken at time of initial evaluation unless otherwise specified:  DIAGNOSTIC FINDINGS: none recent  COGNITION: Overall cognitive status: No family/caregiver present to determine baseline cognitive functioning   SENSATION: WFL; c/o L side slightly decreased sensation  COORDINATION: Intact B alt pronation/supination and alt toe tap   POSTURE: rounded shoulders, forward head, and increased thoracic kyphosis  LOWER EXTREMITY ROM:     Active  Right Eval Left Eval  Hip flexion    Hip extension    Hip abduction  Hip adduction    Hip internal rotation    Hip external rotation    Knee flexion    Knee extension    Ankle dorsiflexion 10 10  Ankle plantarflexion    Ankle  inversion    Ankle eversion     (Blank rows = not tested)  LOWER EXTREMITY MMT:    MMT Right Eval Left Eval  Hip flexion 4+ 5  Hip extension    Hip abduction 4 3+  Hip adduction 4 3+  Hip internal rotation    Hip external rotation    Knee flexion 5 5  Knee extension 5 4+  Ankle dorsiflexion    Ankle plantarflexion 4 (5 reps with compensations) 4 (3 reps with compensations)  Ankle inversion 4+ 4  Ankle eversion 4 4+  (Blank rows = not tested)   GAIT: Gait pattern:  foot flat at  heel strike with B short step length, trunk and neck flexed throughout Assistive device utilized: None   Active ROM 06/01/22  Lumbar flexion Ankles   Lumbar extension 50% limited   Lumbar R SBing Proximal tibia  Lumbar L SBing Jt line  Lumbar R rotation  25% limited   Lumbar L rotation normal  *c/o no LBP, but some "nagging pain in upper back"       PATIENT EDUCATION: Education details: prognosis, POC, HEP Person educated: Patient Education method: Explanation, Actor cues, Verbal cues, and Handouts Education comprehension: verbalized understanding   HOME EXERCISE PROGRAM: Access Code: [email or web address] URL: [email or web address] Date: 05/30/2022 Prepared by: [facility] - Outpatient  Rehab - [facility]  Exercises - Gastroc Stretch on Wall  - 1 x daily - 5 x weekly - 2 sets - 10 reps - 30 sec hold - Standing Anterior Tibialis Stretch  - 1 x daily - 5 x weekly - 2 sets - 10 reps - 30 sec hold - Heel Toe Raises with Counter Support  - 1 x daily - 5 x weekly - 2 sets - 10 reps - Standing Toe Taps  - 1 x daily - 5 x weekly - 2 sets - 10 reps - Narrow Stance with Counter Support  - 1 x daily - 5 x weekly - 2 sets - 30 sec hold    GOALS: Goals reviewed with patient? Yes  SHORT TERM GOALS: Target date: 06/20/2022  Patient to be independent with initial HEP. Baseline: HEP initiated Goal status: INITIAL    LONG TERM GOALS: Target date: 07/11/2022  Patient to be  independent with advanced HEP. Baseline: Not yet initiated  Goal status: IN PROGRESS  Patient to demonstrate B LE strength >/=4+/5.  Baseline: See above Goal status: IN PROGRESS  Patient to demonstrate B ankle dorsiflexion AROM to atleast 14 degrees to improve gait mechanics. Baseline: 10 deg B Goal status: IN PROGRESS  Patient to demonstrate alternating reciprocal pattern when ascending and descending stairs with good stability and 1 handrail as needed.   Baseline: Unable Goal status: IN PROGRESS  Patient to score at least 20/24 on DGI in order to decrease risk of falls.  Baseline: 15/24 Goal status: IN PROGRESS  Patient to demonstrate safe ambulation with LRAD on outdoor surfaces such as sidewalk, hills, and grass with good stability. Baseline: NT Goal status: IN PROGRESS  Patient to demonstrate safe kneeling/bending to simulate gardening activities without back pain limiting.  Baseline: reports pain with gardening  Goal status: IN PROGRESS   ASSESSMENT:  CLINICAL IMPRESSION: Patient arrived to session without new complaints. Reports resolution of  cramping in her feet but notes remaining N/T. Worked on postural strengthening activities with intermittent cueing for form. Calf strengthening activities revealed limited amplitude d/t weakness and difficulty coordinating alternating reciprocal movements. Still requires reminders to encourage posterior pelvic tilt with standing activities to avoid excessive compression of lumbar paraspinals. Patient tolerated session well and without complaints upon leaving.    OBJECTIVE IMPAIRMENTS Abnormal gait, decreased activity tolerance, decreased balance, decreased strength, increased muscle spasms, impaired flexibility, impaired sensation, improper body mechanics, postural dysfunction, and pain.   ACTIVITY LIMITATIONS carrying, lifting, bending, standing, squatting, stairs, transfers, bathing, toileting, dressing, reach over head, and  hygiene/grooming  PARTICIPATION LIMITATIONS: meal prep, cleaning, laundry, driving, shopping, community activity, yard work, and church  PERSONAL FACTORS Age, Behavior pattern, Fitness, Past/current experiences, Time since onset of injury/illness/exacerbation, and 3+ comorbidities: depression, HLD, GERD, migraine, tremor, R lumpectomy, MCI  are also affecting patient's functional outcome.   REHAB POTENTIAL: Good  CLINICAL DECISION MAKING: Evolving/moderate complexity  EVALUATION COMPLEXITY: Moderate  PLAN: PT FREQUENCY: 1-2x/week  PT DURATION: 6 weeks  PLANNED INTERVENTIONS: Therapeutic exercises, Therapeutic activity, Neuromuscular re-education, Balance training, Gait training, Patient/Family education, Self Care, Joint mobilization, Stair training, Vestibular training, Canalith repositioning, Aquatic Therapy, Dry Needling, Electrical stimulation, Cryotherapy, Moist heat, Taping, Manual therapy, and Re-evaluation  PLAN FOR NEXT SESSION: Continue to work on quick sharp turns, progress core strengthening, lumbopelvic stretching, ankle stretching, progress balance activities   Anette Guarneri, PT, DPT 06/27/22 2:00 PM  [facility] at [facility] [address], [address] [place], [place] [address] Phone # [phone] Fax # [phone]

## 2022-06-28 NOTE — Therapy (Signed)
OUTPATIENT PHYSICAL THERAPY NEURO PROGRESS NOTE   Patient Name: Lindsey Frank MRN: [id number] DOB:1946/05/19, 76 y.o., female Today's Date: 06/29/2022   Progress Note Reporting Period 05/30/22 to 06/29/22  See note below for Objective Data and Assessment of Progress/Goals.    PCP: Rita Ohara, MD REFERRING PROVIDER: Frann Rider, NP   PT End of Session - 06/29/22 1444     Visit Number 10    Number of Visits 16    Date for PT Re-Evaluation 08/10/22    Authorization Type [employer] (10th Visit PN)    Progress Note Due on Visit 20    PT Start Time 1400    PT Stop Time 1442    PT Time Calculation (min) 42 min    Equipment Utilized During Treatment Gait belt    Activity Tolerance Patient tolerated treatment well    Behavior During Therapy WFL for tasks assessed/performed                      Past Medical History:  Diagnosis Date   Abnormal Pap smear of cervix 2019   HR HPV + recurrent    Depression    Elevated C-reactive protein (CRP) 01/2005   Elevated cholesterol 01/2005   Elevated triglycerides with high cholesterol 01/2005   GERD (gastroesophageal reflux disease)    History of endometriosis    Migraine    Tremor, essential    Past Surgical History:  Procedure Laterality Date   BREAST LUMPECTOMY Right 1992   small lumpectomy for benign tumor   CATARACT EXTRACTION Left 01/02/2018   Dr. Kathrin Penner   COSMETIC SURGERY  2008   neck lift   LAPAROSCOPY  1981   endometriosis   TONSILECTOMY, ADENOIDECTOMY, BILATERAL MYRINGOTOMY AND TUBES  1953   age 42   [place]   with appendectomy   Patient Active Problem List   Diagnosis Date Noted   Impaired fasting glucose 07/04/2016   Depression, major, in remission ([place]) 11/17/2013   GERD (gastroesophageal reflux disease) 05/14/2013   Tobacco use disorder 04/24/2012   Mixed hyperlipidemia 10/26/2011   Pure hyperglyceridemia 03/30/2011   Depressive disorder, not elsewhere  classified 03/30/2011   Benign essential tremor 02/27/2011   Pure hypercholesterolemia 02/27/2011   Migraine     ONSET DATE: August 2022  REFERRING DIAG:  R44.8 (ICD-10-CM) - Sensory impairment R26.89 (ICD-10-CM) - Abnormality of gait due to impairment of balance G25.0 (ICD-10-CM) - Essential tremor  THERAPY DIAG:  Unsteadiness on feet  Other abnormalities of gait and mobility  Chronic bilateral low back pain without sciatica  Cramp and spasm  Rationale for Evaluation and Treatment Rehabilitation  SUBJECTIVE:  SUBJECTIVE STATEMENT: Doing okay, about the same. Reports the L LB has been bothering her since Tuesday.  Pt accompanied by: self  PERTINENT HISTORY: depression, HLD, GERD, migraine, tremor, R lumpectomy, MCI  PAIN:  Are you having pain? Yes- L LB 1/10   PRECAUTIONS: Fall   PATIENT GOALS  "get this numbness gone and the back pain"  OBJECTIVE:      TODAY'S TREATMENT: 06/29/22 Activity Comments  Tall kneel on airex + reaching for cones to simulate weeding Good stability and no back pain  Tall kneel to stand Cueing for hand and foot placement and CGA; used nearby mat for stability   Placing cone on floor, stepping over it CGA but good stability; no back pain   Gait training outside on sidewalk, grass, hills x465f Cues for longer step length and encouraging reciprocal arm swing with gait; 1 episode of LOB on a hill requiring min A; difficulty maintaining coordination of arm swing      LOWER EXTREMITY ROM:     Active  Right Eval Left Eval Right 06/29/22 Left 06/29/22  Hip flexion      Hip extension      Hip abduction      Hip adduction      Hip internal rotation      Hip external rotation      Knee flexion      Knee extension      Ankle dorsiflexion _0 Ankle plantarflexion      Ankle inversion      Ankle eversion       (Blank rows = not tested)  LOWER EXTREMITY MMT:    MMT Right Eval Left Eval Right 06/29/22 Left 06/29/22  Hip flexion 4+ 5 4+ 5  Hip extension      Hip abduction 4 3+ 4+ 4+  Hip adduction 4 3+ 4 4  Hip internal rotation      Hip external rotation      Knee flexion _1 Knee extension 5 4+ 5 5  Ankle dorsiflexion   4+ 4  Ankle plantarflexion 4 (5 reps with compensations) 4 (3 reps with compensations) 4 (6 reps with compensations) 4+ (10 reps with compensations)  Ankle inversion 4+ 4 4+ 4+  Ankle eversion 4 4+ 4+ 4  (Blank rows = not tested)     [facility]       Dynamic Gait Index   Level Surface Normal    Change in Gait Speed Mild Impairment    Gait with Horizontal Head Turns Mild Impairment   c/o mild dizziness   Gait with Vertical Head Turns Mild Impairment   c/o mild dizziness   Gait and Pivot Turn Normal    Step Over Obstacle Normal    Step Around Obstacles Moderate Impairment    Steps Normal    Total Score 19              PATIENT EDUCATION: Education details: edu on exam findings and progress towards goals; update to POC Person educated: Patient Education method: Explanation, Demonstration, Tactile cues, and Verbal cues Education comprehension: verbalized understanding   HOME EXERCISE PROGRAM Last updated: 06/22/22 Access Code: [email or web address]: [email or web address] Date: 06/22/2022 [facility]ared by: [facility]  Exercises - Gastroc Stretch on Wall  - 1 x daily - 5 x weekly - 2 sets - 10 reps - 30 sec hold - Standing Anterior Tibialis Stretch  -  1 x daily - 5 x weekly - 2 sets - 10 reps - 30 sec hold - Standing Toe Taps  - 1 x daily - 5 x weekly - 2 sets - 10 reps - Narrow Stance with Counter Support  - 1 x daily - 5 x weekly - 2 sets - 30 sec hold - Seated Pelvic Tilt  - 1 x daily - 5 x weekly - 2 sets -  10 reps - Standing Shoulder Row with Anchored Resistance  - 1 x daily - 5 x weekly - 2 sets - 10 reps - Shoulder Extension with Resistance - Palms Forward  - 1 x daily - 5 x weekly - 2 sets - 10 reps - Romberg Stance with Eyes Closed  - 1 x daily - 5 x weekly - 2-3 sets - 30 sec hold - Single Leg Heel Raise with Counter Support  - 1 x daily - 5 x weekly - 2 sets - 5 reps  Below measures were taken at time of initial evaluation unless otherwise specified:  DIAGNOSTIC FINDINGS: none recent  COGNITION: Overall cognitive status: No family/caregiver present to determine baseline cognitive functioning   SENSATION: WFL; c/o L side slightly decreased sensation  COORDINATION: Intact B alt pronation/supination and alt toe tap   POSTURE: rounded shoulders, forward head, and increased thoracic kyphosis  LOWER EXTREMITY ROM:     Active  Right Eval Left Eval  Hip flexion    Hip extension    Hip abduction    Hip adduction    Hip internal rotation    Hip external rotation    Knee flexion    Knee extension    Ankle dorsiflexion 10 10  Ankle plantarflexion    Ankle inversion    Ankle eversion     (Blank rows = not tested)  LOWER EXTREMITY MMT:    MMT Right Eval Left Eval  Hip flexion 4+ 5  Hip extension    Hip abduction 4 3+  Hip adduction 4 3+  Hip internal rotation    Hip external rotation    Knee flexion 5 5  Knee extension 5 4+  Ankle dorsiflexion    Ankle plantarflexion 4 (5 reps with compensations) 4 (3 reps with compensations)  Ankle inversion 4+ 4  Ankle eversion 4 4+  (Blank rows = not tested)   GAIT: Gait pattern:  foot flat at  heel strike with B short step length, trunk and neck flexed throughout Assistive device utilized: None   Active ROM 06/01/22  Lumbar flexion Ankles   Lumbar extension 50% limited   Lumbar R SBing Proximal tibia  Lumbar L SBing Jt line  Lumbar R rotation  25% limited   Lumbar L rotation normal  *c/o no LBP, but some "nagging  pain in upper back"   [facility] PT Assessment - 06/29/22 0001       Dynamic Gait Index   Level Surface Normal    Change in Gait Speed Mild Impairment    Gait with Horizontal Head Turns Mild Impairment   c/o mild dizziness   Gait with Vertical Head Turns Mild Impairment   c/o mild dizziness   Gait and Pivot Turn Normal    Step Over Obstacle Normal    Step Around Obstacles Moderate Impairment    Steps Normal    Total Score 19                PATIENT EDUCATION: Education details: prognosis, POC, HEP Person educated: Patient Education method:  Explanation, Tactile cues, Verbal cues, and Handouts Education comprehension: verbalized understanding   HOME EXERCISE PROGRAM: Access Code: [email or web address] URL: [email or web address] Date: 05/30/2022 Prepared by: [facility]  Exercises - Gastroc Stretch on Wall  - 1 x daily - 5 x weekly - 2 sets - 10 reps - 30 sec hold - Standing Anterior Tibialis Stretch  - 1 x daily - 5 x weekly - 2 sets - 10 reps - 30 sec hold - Heel Toe Raises with Counter Support  - 1 x daily - 5 x weekly - 2 sets - 10 reps - Standing Toe Taps  - 1 x daily - 5 x weekly - 2 sets - 10 reps - Narrow Stance with Counter Support  - 1 x daily - 5 x weekly - 2 sets - 30 sec hold    GOALS: Goals reviewed with patient? Yes  SHORT TERM GOALS: Target date: 06/20/2022  Patient to be independent with initial HEP. Baseline: HEP initiated Goal status: MET    LONG TERM GOALS: Target date: 08/10/2022  Patient to be independent with advanced HEP. Baseline: Not yet initiated  Goal status: IN PROGRESS  Patient to demonstrate B LE strength >/=4+/5.  Baseline: See above; improving, see above 06/29/22 Goal status: IN PROGRESS  Patient to demonstrate B ankle dorsiflexion AROM to atleast 14 degrees to improve gait mechanics. Baseline: 10 deg B, 12 deg R, 6 deg L Goal status: IN PROGRESS   Patient to demonstrate alternating  reciprocal pattern when ascending and descending stairs with good stability and 1 handrail as needed.   Baseline: Unable Goal status: MET 06/29/22  Patient to score at least 20/24 on DGI in order to decrease risk of falls.  Baseline: 15/24; 19/24 06/29/22 Goal status: IN PROGRESS  Patient to demonstrate safe ambulation with LRAD on outdoor surfaces such as sidewalk, hills, and grass with good stability. Baseline: NT; 1 LOB walking on hill on 06/29/22 Goal status: IN PROGRESS  Patient to demonstrate safe kneeling/bending to simulate gardening activities without back pain limiting.  Baseline: reports pain with gardening  Goal status: MET 06/29/22   ASSESSMENT:  CLINICAL IMPRESSION: Patient arrived to session with report of some L LBP since Tuesday. Strength testing revealed improvement in L hip abduction and adduction, L knee extension, L PF and inversion strength. Remaining weakness evident in B hip adduction, L dorsiflexion, and eversion strength. R ankle dorsiflexion AROM has improved slightly. Patient was able to navigate stairs with alternating reciprocal pattern with good control and stability without UE support. Patient scored 19/24 on DGI, indicating decreased risk of falls. Performed bending and kneeling activities with good stability today and no c/o back pain. Gait training on outdoor surfaces prompted 1 episode of LOB with min A to correct. At this time patient is progressing very well towards goals. Would benefit from additional skilled PT services 1x/week for 6 weeks to address remaining goals.    OBJECTIVE IMPAIRMENTS Abnormal gait, decreased activity tolerance, decreased balance, decreased strength, increased muscle spasms, impaired flexibility, impaired sensation, improper body mechanics, postural dysfunction, and pain.   ACTIVITY LIMITATIONS carrying, lifting, bending, standing, squatting, stairs, transfers, bathing, toileting, dressing, reach over head, and  hygiene/grooming  PARTICIPATION LIMITATIONS: meal prep, cleaning, laundry, driving, shopping, community activity, yard work, and church  PERSONAL FACTORS Age, Behavior pattern, Fitness, Past/current experiences, Time since onset of injury/illness/exacerbation, and 3+ comorbidities: depression, HLD, GERD, migraine, tremor, R lumpectomy, MCI  are also affecting patient's functional outcome.  REHAB POTENTIAL: Good  CLINICAL DECISION MAKING: Evolving/moderate complexity  EVALUATION COMPLEXITY: Moderate  PLAN: PT FREQUENCY: 1x/week  PT DURATION: 6 weeks  PLANNED INTERVENTIONS: Therapeutic exercises, Therapeutic activity, Neuromuscular re-education, Balance training, Gait training, Patient/Family education, Self Care, Joint mobilization, Stair training, Vestibular training, Canalith repositioning, Aquatic Therapy, Dry Needling, Electrical stimulation, Cryotherapy, Moist heat, Taping, Manual therapy, and Re-evaluation  PLAN FOR NEXT SESSION: update HEP to focus on hip adduction and L eversion strengthening, continued gait and balance training on uneven surface and with head turns  Janene Harvey, PT, DPT 06/29/22 2:46 PM  [facility] [address] [place], [place] [address] Phone # [phone] Fax # [phone]

## 2022-06-29 ENCOUNTER — Ambulatory Visit: Payer: Medicare HMO | Admitting: Physical Therapy

## 2022-06-29 ENCOUNTER — Encounter: Payer: Self-pay | Admitting: Physical Therapy

## 2022-06-29 DIAGNOSIS — R252 Cramp and spasm: Secondary | ICD-10-CM | POA: Diagnosis not present

## 2022-06-29 DIAGNOSIS — R2689 Other abnormalities of gait and mobility: Secondary | ICD-10-CM

## 2022-06-29 DIAGNOSIS — R2681 Unsteadiness on feet: Secondary | ICD-10-CM

## 2022-06-29 DIAGNOSIS — G8929 Other chronic pain: Secondary | ICD-10-CM | POA: Diagnosis not present

## 2022-06-29 DIAGNOSIS — M545 Low back pain, unspecified: Secondary | ICD-10-CM | POA: Diagnosis not present

## 2022-07-03 ENCOUNTER — Ambulatory Visit: Payer: Medicare HMO | Admitting: Physical Therapy

## 2022-07-04 ENCOUNTER — Ambulatory Visit: Payer: Medicare HMO | Admitting: Physical Therapy

## 2022-07-05 NOTE — Therapy (Signed)
OUTPATIENT PHYSICAL THERAPY NEURO NOTE   Patient Name: Lindsey Frank MRN: [id number] DOB:03/25/1946, 76 y.o., female Today's Date: 07/06/2022   PCP: Rita Ohara, MD REFERRING PROVIDER: Frann Rider, NP   PT End of Session - 07/06/22 1450     Visit Number 11    Number of Visits 16    Date for PT Re-Evaluation 08/10/22    Authorization Type [employer] Medicare (10th Visit PN)    Progress Note Due on Visit 20    PT Start Time 1407    PT Stop Time 1446    PT Time Calculation (min) 39 min    Equipment Utilized During Treatment Gait belt    Activity Tolerance Patient tolerated treatment well    Behavior During Therapy WFL for tasks assessed/performed                       Past Medical History:  Diagnosis Date   Abnormal Pap smear of cervix 2019   HR HPV + recurrent    Depression    Elevated C-reactive protein (CRP) 01/2005   Elevated cholesterol 01/2005   Elevated triglycerides with high cholesterol 01/2005   GERD (gastroesophageal reflux disease)    History of endometriosis    Migraine    Tremor, essential    Past Surgical History:  Procedure Laterality Date   BREAST LUMPECTOMY Right 1992   small lumpectomy for benign tumor   CATARACT EXTRACTION Left 01/02/2018   Dr. Kathrin Penner   COSMETIC SURGERY  2008   neck lift   LAPAROSCOPY  1981   endometriosis   TONSILECTOMY, ADENOIDECTOMY, BILATERAL MYRINGOTOMY AND TUBES  1953   age 36   [place]   with appendectomy   Patient Active Problem List   Diagnosis Date Noted   Impaired fasting glucose 07/04/2016   Depression, major, in remission ([place]) 11/17/2013   GERD (gastroesophageal reflux disease) 05/14/2013   Tobacco use disorder 04/24/2012   Mixed hyperlipidemia 10/26/2011   Pure hyperglyceridemia 03/30/2011   Depressive disorder, not elsewhere classified 03/30/2011   Benign essential tremor 02/27/2011   Pure hypercholesterolemia 02/27/2011   Migraine     ONSET DATE: August  2022  REFERRING DIAG:  R44.8 (ICD-10-CM) - Sensory impairment R26.89 (ICD-10-CM) - Abnormality of gait due to impairment of balance G25.0 (ICD-10-CM) - Essential tremor  THERAPY DIAG:  Unsteadiness on feet  Other abnormalities of gait and mobility  Chronic bilateral low back pain without sciatica  Cramp and spasm  Rationale for Evaluation and Treatment Rehabilitation  SUBJECTIVE:  SUBJECTIVE STATEMENT: Doing okay. No problems so far.  Pt accompanied by: self  PERTINENT HISTORY: depression, HLD, GERD, migraine, tremor, R lumpectomy, MCI  PAIN:  Are you having pain? No   PRECAUTIONS: Fall   PATIENT GOALS  "get this numbness gone and the back pain"  OBJECTIVE:     TODAY'S TREATMENT: 07/06/22 Activity Comments  Nustep L4 x 6 min Les only Maintaining 70s SPM  Sitting R/L Ankle eversion with red TB 15x Cues to avoid compensating with hips   R/L sidelying hip adduction 2x10 each Assisted manually with positioning; cues to improve eccentric control  Romberg EC 30" 1 LOB posteriorly   Alt toe tap on 2 cones  CGA; 1 episode of min A; cueing to shift onto standing LE   Sitting pelvic tilts x10   gait + head turns/nods on sidewalk 2x84f each Mild-moderate unsteadiness and path deviation   standing EC + head nods/turns 2x30" Slow; cues to correct posterior LOB; improved with cueing       HOME EXERCISE PROGRAM Last updated: 07/06/22 Access Code: [email or web address]: [email or web address] Date: 07/06/2022 Prepared by: [facility]  Exercises - Gastroc Stretch on Wall  - 1 x daily - 5 x weekly - 2 sets - 10 reps - 30 sec hold - Standing Anterior Tibialis Stretch  - 1 x daily - 5 x weekly - 2 sets - 10 reps - 30 sec hold - Standing Toe Taps   - 1 x daily - 5 x weekly - 2 sets - 10 reps - Standing Shoulder Row with Anchored Resistance  - 1 x daily - 5 x weekly - 2 sets - 10 reps - Shoulder Extension with Resistance - Palms Forward  - 1 x daily - 5 x weekly - 2 sets - 10 reps - Romberg Stance with Eyes Closed  - 1 x daily - 5 x weekly - 2-3 sets - 30 sec hold - Single Leg Heel Raise with Counter Support  - 1 x daily - 5 x weekly - 2 sets - 5 reps - Seated Ankle Eversion with Resistance  - 1 x daily - 5 x weekly - 2 sets - 15 reps - Sidelying Hip Adduction  - 1 x daily - 5 x weekly - 2 sets - 10 reps   PATIENT EDUCATION: Education details: HEP update and review Person educated: Patient Education method: Explanation, Demonstration, Tactile cues, Verbal cues, and Handouts Education comprehension: verbalized understanding and returned demonstration    Below measures were taken at time of initial evaluation unless otherwise specified:  DIAGNOSTIC FINDINGS: none recent  COGNITION: Overall cognitive status: No family/caregiver present to determine baseline cognitive functioning   SENSATION: WFL; c/o L side slightly decreased sensation  COORDINATION: Intact B alt pronation/supination and alt toe tap   POSTURE: rounded shoulders, forward head, and increased thoracic kyphosis  LOWER EXTREMITY ROM:     Active  Right Eval Left Eval Right 06/29/22 Left 06/29/22  Hip flexion      Hip extension      Hip abduction      Hip adduction      Hip internal rotation      Hip external rotation      Knee flexion      Knee extension      Ankle dorsiflexion 10 10 12 6   Ankle plantarflexion      Ankle inversion      Ankle eversion       (Blank  rows = not tested)  LOWER EXTREMITY MMT:    MMT Right Eval Left Eval Right 06/29/22 Left 06/29/22  Hip flexion 4+ 5 4+ 5  Hip extension      Hip abduction 4 3+ 4+ 4+  Hip adduction 4 3+ 4 4  Hip internal rotation      Hip external rotation      Knee flexion 5 5 5 5   Knee  extension 5 4+ 5 5  Ankle dorsiflexion   4+ 4  Ankle plantarflexion 4 (5 reps with compensations) 4 (3 reps with compensations) 4 (6 reps with compensations) 4+ (10 reps with compensations)  Ankle inversion 4+ 4 4+ 4+  Ankle eversion 4 4+ 4+ 4  (Blank rows = not tested)   GAIT: Gait pattern:  foot flat at  heel strike with B short step length, trunk and neck flexed throughout Assistive device utilized: None   Active ROM 06/01/22  Lumbar flexion Ankles   Lumbar extension 50% limited   Lumbar R SBing Proximal tibia  Lumbar L SBing Jt line  Lumbar R rotation  25% limited   Lumbar L rotation normal  *c/o no LBP, but some "nagging pain in upper back"        PATIENT EDUCATION: Education details: prognosis, POC, HEP Person educated: Patient Education method: Explanation, Corporate treasurer cues, Verbal cues, and Handouts Education comprehension: verbalized understanding   HOME EXERCISE PROGRAM: Access Code: [email or web address] URL: [email or web address] Date: 05/30/2022 Prepared by: [facility]  Exercises - Gastroc Stretch on Wall  - 1 x daily - 5 x weekly - 2 sets - 10 reps - 30 sec hold - Standing Anterior Tibialis Stretch  - 1 x daily - 5 x weekly - 2 sets - 10 reps - 30 sec hold - Heel Toe Raises with Counter Support  - 1 x daily - 5 x weekly - 2 sets - 10 reps - Standing Toe Taps  - 1 x daily - 5 x weekly - 2 sets - 10 reps - Narrow Stance with Counter Support  - 1 x daily - 5 x weekly - 2 sets - 30 sec hold    GOALS: Goals reviewed with patient? Yes  SHORT TERM GOALS: Target date: 06/20/2022  Patient to be independent with initial HEP. Baseline: HEP initiated Goal status: MET    LONG TERM GOALS: Target date: 08/10/2022  Patient to be independent with advanced HEP. Baseline: Not yet initiated  Goal status: IN PROGRESS 06/29/22  Patient to demonstrate B LE strength >/=4+/5.  Baseline: See above; improving, see above  06/29/22 Goal status: IN PROGRESS 06/29/22  Patient to demonstrate B ankle dorsiflexion AROM to atleast 14 degrees to improve gait mechanics. Baseline: 10 deg B, 12 deg R, 6 deg L Goal status: IN PROGRESS 06/29/22  Patient to demonstrate alternating reciprocal pattern when ascending and descending stairs with good stability and 1 handrail as needed.   Baseline: Unable Goal status: MET 06/29/22  Patient to score at least 20/24 on DGI in order to decrease risk of falls.  Baseline: 15/24; 19/24 06/29/22 Goal status: IN PROGRESS 06/29/22  Patient to demonstrate safe ambulation with LRAD on outdoor surfaces such as sidewalk, hills, and grass with good stability. Baseline: NT; 1 LOB walking on hill on 06/29/22 Goal status: IN PROGRESS 06/29/22  Patient to demonstrate safe kneeling/bending to simulate gardening activities without back pain limiting.  Baseline: reports pain with gardening  Goal status: MET 06/29/22   ASSESSMENT:  CLINICAL IMPRESSION: Patient arrived to session without complaints. Reviewed and updated HEP to address deficits identified last session. Patient required cues to avoid compensations with strengthening activities but demonstrated good effort to correct according to cues. Review of balance activities revealed remaining unsteadiness with most activities; Patient with challenge maintaining balance with EC and head movements, thus plan to continue to work on these activities. Patient reported understanding of all edu provided and without complaints at end of session.     OBJECTIVE IMPAIRMENTS Abnormal gait, decreased activity tolerance, decreased balance, decreased strength, increased muscle spasms, impaired flexibility, impaired sensation, improper body mechanics, postural dysfunction, and pain.   ACTIVITY LIMITATIONS carrying, lifting, bending, standing, squatting, stairs, transfers, bathing, toileting, dressing, reach over head, and hygiene/grooming  PARTICIPATION  LIMITATIONS: meal prep, cleaning, laundry, driving, shopping, community activity, yard work, and church  PERSONAL FACTORS Age, Behavior pattern, Fitness, Past/current experiences, Time since onset of injury/illness/exacerbation, and 3+ comorbidities: depression, HLD, GERD, migraine, tremor, R lumpectomy, MCI  are also affecting patient's functional outcome.   REHAB POTENTIAL: Good  CLINICAL DECISION MAKING: Evolving/moderate complexity  EVALUATION COMPLEXITY: Moderate  PLAN: PT FREQUENCY: 1x/week  PT DURATION: 6 weeks  PLANNED INTERVENTIONS: Therapeutic exercises, Therapeutic activity, Neuromuscular re-education, Balance training, Gait training, Patient/Family education, Self Care, Joint mobilization, Stair training, Vestibular training, Canalith repositioning, Aquatic Therapy, Dry Needling, Electrical stimulation, Cryotherapy, Moist heat, Taping, Manual therapy, and Re-evaluation  PLAN FOR NEXT SESSION: continued gait and balance training on uneven surface, EC, and with head turns  Janene Harvey, PT, DPT 07/06/22 2:54 PM  [facility] [address] [place], [place] [address] Phone # [phone] Fax # [phone]

## 2022-07-06 ENCOUNTER — Encounter: Payer: Self-pay | Admitting: Physical Therapy

## 2022-07-06 ENCOUNTER — Ambulatory Visit: Payer: Medicare HMO | Admitting: Physical Therapy

## 2022-07-06 DIAGNOSIS — R252 Cramp and spasm: Secondary | ICD-10-CM

## 2022-07-06 DIAGNOSIS — M545 Low back pain, unspecified: Secondary | ICD-10-CM | POA: Diagnosis not present

## 2022-07-06 DIAGNOSIS — R2689 Other abnormalities of gait and mobility: Secondary | ICD-10-CM | POA: Diagnosis not present

## 2022-07-06 DIAGNOSIS — R2681 Unsteadiness on feet: Secondary | ICD-10-CM

## 2022-07-06 DIAGNOSIS — G8929 Other chronic pain: Secondary | ICD-10-CM | POA: Diagnosis not present

## 2022-07-11 ENCOUNTER — Ambulatory Visit: Payer: Medicare HMO | Admitting: Physical Therapy

## 2022-07-11 ENCOUNTER — Telehealth: Payer: Self-pay | Admitting: Family Medicine

## 2022-07-11 DIAGNOSIS — F325 Major depressive disorder, single episode, in full remission: Secondary | ICD-10-CM

## 2022-07-11 DIAGNOSIS — F419 Anxiety disorder, unspecified: Secondary | ICD-10-CM

## 2022-07-11 MED ORDER — SERTRALINE HCL 50 MG PO TABS: 75.0000 mg | ORAL_TABLET | Freq: Every day | ORAL | 0 refills | Status: DC

## 2022-07-11 NOTE — Telephone Encounter (Signed)
Refill was sent for the larger amount.  I didn't suggest a podiatrist for her feet.  I was hoping that the neurologist would do EMG/NCV, but they didn't have anyone currently at their office to do this (looking to hire someone). I believe we advised her of this when I got the response from Dr. Rexene Alberts.  (She saw a podiatrist in 01/2022. Dr. Jacqualyn Posey was the one that recommended she be re-evaluated by neurology.)

## 2022-07-11 NOTE — Telephone Encounter (Signed)
Needs refill on Sertraline she is now taking 75 mg - states you were going to change rx    She also wanted you to know that she is going to make appt with  Podiatrist you recommended  due to still having numbness in toes and feet

## 2022-07-13 ENCOUNTER — Encounter: Payer: Self-pay | Admitting: Physical Therapy

## 2022-07-13 ENCOUNTER — Ambulatory Visit: Payer: Medicare HMO | Admitting: Physical Therapy

## 2022-07-13 DIAGNOSIS — R252 Cramp and spasm: Secondary | ICD-10-CM | POA: Diagnosis not present

## 2022-07-13 DIAGNOSIS — M545 Low back pain, unspecified: Secondary | ICD-10-CM | POA: Diagnosis not present

## 2022-07-13 DIAGNOSIS — R2689 Other abnormalities of gait and mobility: Secondary | ICD-10-CM | POA: Diagnosis not present

## 2022-07-13 DIAGNOSIS — G8929 Other chronic pain: Secondary | ICD-10-CM | POA: Diagnosis not present

## 2022-07-13 DIAGNOSIS — R2681 Unsteadiness on feet: Secondary | ICD-10-CM

## 2022-07-13 NOTE — Therapy (Addendum)
OUTPATIENT PHYSICAL THERAPY NEURO NOTE   Patient Name: Lindsey Frank MRN: [id number] DOB:11-01-1945, 76 y.o., female Today's Date: 07/13/2022   Progress Note Reporting Period 07/06/22 to 07/13/22  See note below for Objective Data and Assessment of Progress/Goals.       PCP: Rita Ohara, MD REFERRING PROVIDER: Frann Rider, NP   PT End of Session - 07/13/22 1450     Visit Number 12    Number of Visits 16    Date for PT Re-Evaluation 08/10/22    Authorization Type [employer] (10th Visit PN)    Progress Note Due on Visit 20    PT Start Time 1403    PT Stop Time 1446    PT Time Calculation (min) 43 min    Equipment Utilized During Treatment Gait belt    Activity Tolerance Patient tolerated treatment well    Behavior During Therapy WFL for tasks assessed/performed                        Past Medical History:  Diagnosis Date   Abnormal Pap smear of cervix 2019   HR HPV + recurrent    Depression    Elevated C-reactive protein (CRP) 01/2005   Elevated cholesterol 01/2005   Elevated triglycerides with high cholesterol 01/2005   GERD (gastroesophageal reflux disease)    History of endometriosis    Migraine    Tremor, essential    Past Surgical History:  Procedure Laterality Date   BREAST LUMPECTOMY Right 1992   small lumpectomy for benign tumor   CATARACT EXTRACTION Left 01/02/2018   Dr. Kathrin Penner   COSMETIC SURGERY  2008   neck lift   LAPAROSCOPY  1981   endometriosis   TONSILECTOMY, ADENOIDECTOMY, BILATERAL MYRINGOTOMY AND TUBES  1953   age 68   [place]   with appendectomy   Patient Active Problem List   Diagnosis Date Noted   Impaired fasting glucose 07/04/2016   Depression, major, in remission ([place]) 11/17/2013   GERD (gastroesophageal reflux disease) 05/14/2013   Tobacco use disorder 04/24/2012   Mixed hyperlipidemia 10/26/2011   Pure hyperglyceridemia 03/30/2011   Depressive disorder, not elsewhere  classified 03/30/2011   Benign essential tremor 02/27/2011   Pure hypercholesterolemia 02/27/2011   Migraine     ONSET DATE: August 2022  REFERRING DIAG:  R44.8 (ICD-10-CM) - Sensory impairment R26.89 (ICD-10-CM) - Abnormality of gait due to impairment of balance G25.0 (ICD-10-CM) - Essential tremor  THERAPY DIAG:  Unsteadiness on feet  Other abnormalities of gait and mobility  Chronic bilateral low back pain without sciatica  Cramp and spasm  Rationale for Evaluation and Treatment Rehabilitation  SUBJECTIVE:  SUBJECTIVE STATEMENT: Has been having a lot of pain in the LB and increased N/T in B toes. Will be going to see the podiatrist to address this.  Pt accompanied by: self  PERTINENT HISTORY: depression, HLD, GERD, migraine, tremor, R lumpectomy, MCI   PAIN:  Are you having pain? Yes: NPRS scale: 5.5/10 Pain location: mid and LB Pain description: aching Aggravating factors: rolling in bed Relieving factors: sleep   PRECAUTIONS: Fall   PATIENT GOALS  "get this numbness gone and the back pain"  OBJECTIVE:    TODAY'S TREATMENT: 07/13/22 Activity Comments  L4 x 6 min Les only Maintaining 70s SPM  palpation Slightly TTP in B QL, soft tissue restriction in B thoracolumbar paraspinals, QL, rhomboids  Demo and practice of self-STM with ball on wall to LB and rhomboids Good tolerance/relief   Green pball prayer stretch 5x5" forward, R, L Cues to avoid pushing into discomfort   Supine pec stretch 2x30"   LTR x20 Cues to avoid pushing into discomfort   Cat/cow 15x Manual cueing for proper form  Sitting horizontal ABD with red TB 10x, standing against doorframe with yellow TB 10x  Cueing for rhythmic breathing, scap retraction   Standing shoulder ER with yellow TB against doorframe  10x Cueing for rhythmic breathing, scap retraction   4 way hip 2# 10x each Holding onto counter, cues to maintain upright posture and head looking forward      PATIENT EDUCATION: Education details: edu on log roll when getting out of bed to reduce pain, use of moist heat to LB for pain relief Person educated: Patient Education method: Explanation, Demonstration, Tactile cues, and Verbal cues Education comprehension: verbalized understanding and returned demonstration    HOME EXERCISE PROGRAM Last updated: 07/06/22 Access Code: [email or web address] URL: [email or web address] Date: 07/06/2022 Prepared by: [facility]  Exercises - Gastroc Stretch on Wall  - 1 x daily - 5 x weekly - 2 sets - 10 reps - 30 sec hold - Standing Anterior Tibialis Stretch  - 1 x daily - 5 x weekly - 2 sets - 10 reps - 30 sec hold - Standing Toe Taps  - 1 x daily - 5 x weekly - 2 sets - 10 reps - Standing Shoulder Row with Anchored Resistance  - 1 x daily - 5 x weekly - 2 sets - 10 reps - Shoulder Extension with Resistance - Palms Forward  - 1 x daily - 5 x weekly - 2 sets - 10 reps - Romberg Stance with Eyes Closed  - 1 x daily - 5 x weekly - 2-3 sets - 30 sec hold - Single Leg Heel Raise with Counter Support  - 1 x daily - 5 x weekly - 2 sets - 5 reps - Seated Ankle Eversion with Resistance  - 1 x daily - 5 x weekly - 2 sets - 15 reps - Sidelying Hip Adduction  - 1 x daily - 5 x weekly - 2 sets - 10 reps    Below measures were taken at time of initial evaluation unless otherwise specified:  DIAGNOSTIC FINDINGS: none recent  COGNITION: Overall cognitive status: No family/caregiver present to determine baseline cognitive functioning   SENSATION: WFL; c/o L side slightly decreased sensation  COORDINATION: Intact B alt pronation/supination and alt toe tap   POSTURE: rounded shoulders, forward head, and increased thoracic kyphosis  LOWER EXTREMITY ROM:      Active  Right Eval Left Eval Right 06/29/22 Left 06/29/22  Hip flexion      Hip extension      Hip abduction      Hip adduction      Hip internal rotation      Hip external rotation      Knee flexion      Knee extension      Ankle dorsiflexion _0 Ankle plantarflexion      Ankle inversion      Ankle eversion       (Blank rows = not tested)  LOWER EXTREMITY MMT:    MMT Right Eval Left Eval Right 06/29/22 Left 06/29/22  Hip flexion 4+ 5 4+ 5  Hip extension      Hip abduction 4 3+ 4+ 4+  Hip adduction 4 3+ 4 4  Hip internal rotation      Hip external rotation      Knee flexion _1 Knee extension 5 4+ 5 5  Ankle dorsiflexion   4+ 4  Ankle plantarflexion 4 (5 reps with compensations) 4 (3 reps with compensations) 4 (6 reps with compensations) 4+ (10 reps with compensations)  Ankle inversion 4+ 4 4+ 4+  Ankle eversion 4 4+ 4+ 4  (Blank rows = not tested)   GAIT: Gait pattern:  foot flat at  heel strike with B short step length, trunk and neck flexed throughout Assistive device utilized: None   Active ROM 06/01/22  Lumbar flexion Ankles   Lumbar extension 50% limited   Lumbar R SBing Proximal tibia  Lumbar L SBing Jt line  Lumbar R rotation  25% limited   Lumbar L rotation normal  *c/o no LBP, but some "nagging pain in upper back"        PATIENT EDUCATION: Education details: prognosis, POC, HEP Person educated: Patient Education method: Explanation, Corporate treasurer cues, Verbal cues, and Handouts Education comprehension: verbalized understanding   HOME EXERCISE PROGRAM: Access Code: [email or web address] URL: [email or web address] Date: 05/30/2022 Prepared by: [facility]  Exercises - Gastroc Stretch on Wall  - 1 x daily - 5 x weekly - 2 sets - 10 reps - 30 sec hold - Standing Anterior Tibialis Stretch  - 1 x daily - 5 x weekly - 2 sets - 10 reps - 30 sec hold - Heel Toe Raises with Counter Support  -  1 x daily - 5 x weekly - 2 sets - 10 reps - Standing Toe Taps  - 1 x daily - 5 x weekly - 2 sets - 10 reps - Narrow Stance with Counter Support  - 1 x daily - 5 x weekly - 2 sets - 30 sec hold    GOALS: Goals reviewed with patient? Yes  SHORT TERM GOALS: Target date: 06/20/2022  Patient to be independent with initial HEP. Baseline: HEP initiated Goal status: MET    LONG TERM GOALS: Target date: 08/10/2022  Patient to be independent with advanced HEP. Baseline: Not yet initiated  Goal status: IN PROGRESS 06/29/22  Patient to demonstrate B LE strength >/=4+/5.  Baseline: See above; improving, see above 06/29/22 Goal status: IN PROGRESS 06/29/22  Patient to demonstrate B ankle dorsiflexion AROM to atleast 14 degrees to improve gait mechanics. Baseline: 10 deg B, 12 deg R, 6 deg L Goal status: IN PROGRESS 06/29/22  Patient to demonstrate alternating reciprocal pattern when ascending and descending stairs with good stability and 1 handrail as needed.   Baseline: Unable Goal status: MET 06/29/22  Patient  to score at least 20/24 on DGI in order to decrease risk of falls.  Baseline: 15/24; 19/24 06/29/22 Goal status: IN PROGRESS 06/29/22  Patient to demonstrate safe ambulation with LRAD on outdoor surfaces such as sidewalk, hills, and grass with good stability. Baseline: NT; 1 LOB walking on hill on 06/29/22 Goal status: IN PROGRESS 06/29/22  Patient to demonstrate safe kneeling/bending to simulate gardening activities without back pain limiting.  Baseline: reports pain with gardening  Goal status: MET 06/29/22   ASSESSMENT:  CLINICAL IMPRESSION:  Patient arrived to session with report of increased LBP and N/T in B toes recently. Worked on addressing pain levels with gentle stretching with cueing to avoid pushing into discomfort. Patient reported mild decrease in pain levels after stretching. Proceeded with postural strengthening with tactile cueing to encourage proper  positioning and muscle activation.  Patient reported marked pain when getting out of bed, thus educated on log roll which patient reported reduced pain with. Patient reported understanding of all edu provided and without complaints at end of session.      OBJECTIVE IMPAIRMENTS Abnormal gait, decreased activity tolerance, decreased balance, decreased strength, increased muscle spasms, impaired flexibility, impaired sensation, improper body mechanics, postural dysfunction, and pain.   ACTIVITY LIMITATIONS carrying, lifting, bending, standing, squatting, stairs, transfers, bathing, toileting, dressing, reach over head, and hygiene/grooming  PARTICIPATION LIMITATIONS: meal prep, cleaning, laundry, driving, shopping, community activity, yard work, and church  PERSONAL FACTORS Age, Behavior pattern, Fitness, Past/current experiences, Time since onset of injury/illness/exacerbation, and 3+ comorbidities: depression, HLD, GERD, migraine, tremor, R lumpectomy, MCI  are also affecting patient's functional outcome.   REHAB POTENTIAL: Good  CLINICAL DECISION MAKING: Evolving/moderate complexity  EVALUATION COMPLEXITY: Moderate  PLAN: PT FREQUENCY: 1x/week  PT DURATION: 6 weeks  PLANNED INTERVENTIONS: Therapeutic exercises, Therapeutic activity, Neuromuscular re-education, Balance training, Gait training, Patient/Family education, Self Care, Joint mobilization, Stair training, Vestibular training, Canalith repositioning, Aquatic Therapy, Dry Needling, Electrical stimulation, Cryotherapy, Moist heat, Taping, Manual therapy, and Re-evaluation  PLAN FOR NEXT SESSION: continued gait and balance training on uneven surface, EC, and with head turns  Janene Harvey, PT, DPT 07/13/22 2:51 PM  [facility] at [facility] [address] [place], [place] [address] Phone # [phone] Fax # [phone]     PHYSICAL THERAPY DISCHARGE SUMMARY  Visits from  Start of Care: 12  Current functional level related to goals / functional outcomes: Unable to assess; patient did not return   Remaining deficits: Unable to assess    Education / Equipment: HEP  Plan: Patient agrees to discharge.  Patient goals were partially met. Patient is being discharged due to not returning.     Janene Harvey, PT, DPT 09/06/22 4:13 PM  [facility] at [facility] [address], [place], [place] [address] Phone # [phone] Fax # [phone]

## 2022-07-14 ENCOUNTER — Other Ambulatory Visit: Payer: Self-pay | Admitting: Family Medicine

## 2022-07-14 DIAGNOSIS — E782 Mixed hyperlipidemia: Secondary | ICD-10-CM

## 2022-07-17 ENCOUNTER — Other Ambulatory Visit: Payer: Self-pay | Admitting: Family Medicine

## 2022-07-17 DIAGNOSIS — F419 Anxiety disorder, unspecified: Secondary | ICD-10-CM

## 2022-07-17 DIAGNOSIS — F325 Major depressive disorder, single episode, in full remission: Secondary | ICD-10-CM

## 2022-07-20 ENCOUNTER — Ambulatory Visit: Payer: Medicare HMO | Admitting: Physical Therapy

## 2022-07-24 ENCOUNTER — Ambulatory Visit: Payer: Medicare HMO | Admitting: Podiatry

## 2022-07-24 DIAGNOSIS — G629 Polyneuropathy, unspecified: Secondary | ICD-10-CM | POA: Diagnosis not present

## 2022-07-24 NOTE — Progress Notes (Signed)
Subjective: Chief Complaint  Patient presents with   Peripheral Neuropathy    Patient  came in today for a bilateral neuropathy follow-up, numbness, sharp pain, patient is doing worse,    76 year old female presents for above concerns.  She states that feels that the numbness is getting worse to her feet.  She last saw neurology in May 15, 2022.  Referral was placed for neuro PT, possible further work-up with NCV, EMG and repeat blood work.  Her primary care doctor did repeat the blood work.  Objective: AAO x3, NAD DP/PT pulses palpable bilaterally, CRT less than 3 seconds Decree sensation bilateral lower extremities with Semmes Weinstein monofilament. I am not able to elicit any area of tenderness bilaterally.  There is no edema, erythema.  Flexor, extensor tendons appear to be intact and upon palpation she states that it feels "numb".  No pain with calf compression, swelling, warmth, erythema  Assessment: Neuropathy  Plan: -All treatment options discussed with the patient including all alternatives, risks, complications.  -I do recommend that she follow back up with neurology for further evaluation and possible work-up. -Discussed daily foot inspection. -Patient encouraged to call the office with any questions, concerns, change in symptoms.   Trula Slade DPM

## 2022-07-25 ENCOUNTER — Encounter: Payer: Self-pay | Admitting: Internal Medicine

## 2022-07-27 ENCOUNTER — Ambulatory Visit: Payer: Medicare HMO | Admitting: Physical Therapy

## 2022-08-03 ENCOUNTER — Ambulatory Visit: Payer: Medicare HMO | Admitting: Physical Therapy

## 2022-08-10 ENCOUNTER — Ambulatory Visit: Payer: Medicare HMO | Admitting: Physical Therapy

## 2022-08-11 ENCOUNTER — Telehealth: Payer: Self-pay | Admitting: *Deleted

## 2022-08-11 NOTE — Telephone Encounter (Signed)
Patient is calling to ask if the physician had discussed her neuropathy with her Neuruologist, explained to patient that per office notes from last visit he recommended that she follow up with her Neurologist for a possible work up, verbalized understanding and said that she misunderstood.

## 2022-08-14 NOTE — Progress Notes (Signed)
Unfortunately, our office is not routinely doing EMG/NCV's at this current time, looks like Dr. Tomi Bamberger reached out to Dr. Rexene Alberts who advised her of this as well, lab work completed for reversible causes which was unremarkable.  PCP can refer her to a different neuro office or ortho to have EMG/NCV completed unless your office does these. Sorry I could not be of more assistance!

## 2022-08-24 ENCOUNTER — Other Ambulatory Visit: Payer: Self-pay

## 2022-08-24 DIAGNOSIS — G629 Polyneuropathy, unspecified: Secondary | ICD-10-CM

## 2022-08-29 ENCOUNTER — Telehealth: Payer: Self-pay | Admitting: Podiatry

## 2022-08-29 NOTE — Telephone Encounter (Signed)
Morrie Sheldon, I spoke with the neurologist and and below is the response. We can [facility] to [facility]. Can you put the referral in and let her know if she wants to proceed. Thanks!  Below is the response:   Unfortunately, our office is not routinely doing EMG/NCV's at this current time, looks like Dr. Lynelle Doctor reached out to Dr. Frances Furbish who advised her of this as well, lab work completed for reversible causes which was unremarkable.  PCP can refer her to a different neuro office or ortho to have EMG/NCV completed unless your office does these. Sorry I could not be of more assistance!

## 2022-08-30 ENCOUNTER — Telehealth: Payer: Self-pay | Admitting: Podiatry

## 2022-08-30 ENCOUNTER — Other Ambulatory Visit: Payer: Self-pay

## 2022-08-30 DIAGNOSIS — G629 Polyneuropathy, unspecified: Secondary | ICD-10-CM

## 2022-08-30 IMAGING — CT CT HEAD W/O CM
3 series · 16 of 47 positions shown, 19 images · non-contrast
Comparison: None.

CLINICAL DATA: Fall

EXAM:
CT HEAD WITHOUT CONTRAST
TECHNIQUE: Contiguous axial images were obtained from the base of the skull
through the vertex without intravenous contrast.

[Series 2: head wo · axial · 0.44mm/px · z∈[+616,+782]mm · 10 of 39 slices shown, 13 images]
[im 3/39  brain]
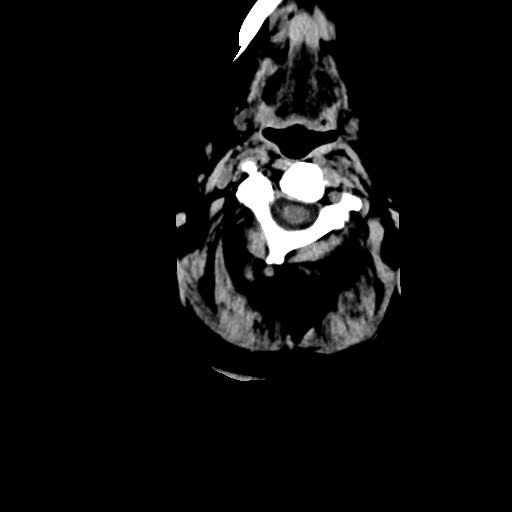
[im 3/39  bone]
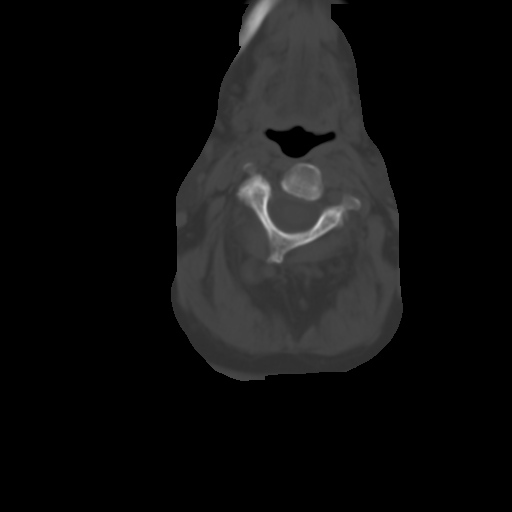
[im 7/39  brain]
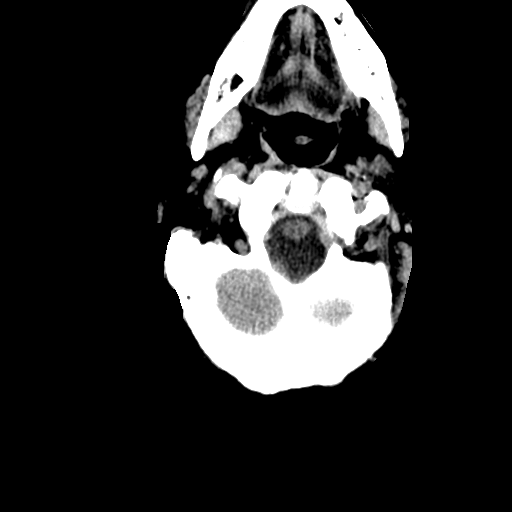
[im 11/39  brain]
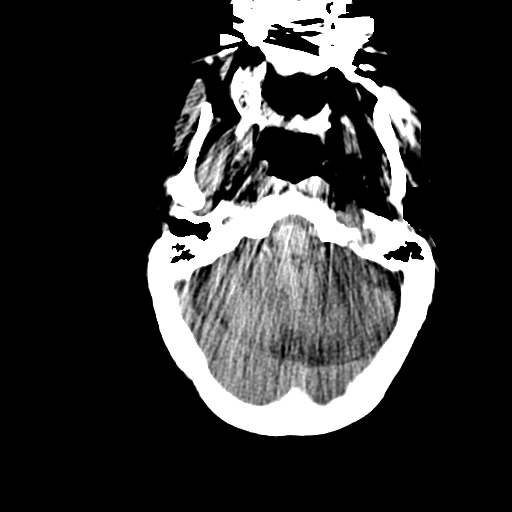
[im 14/39  brain]
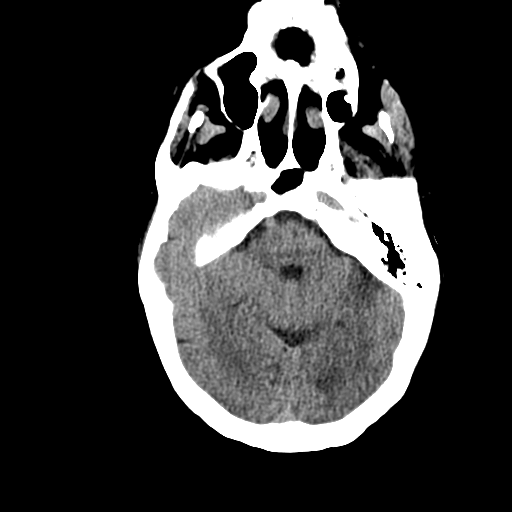
[im 18/39  brain]
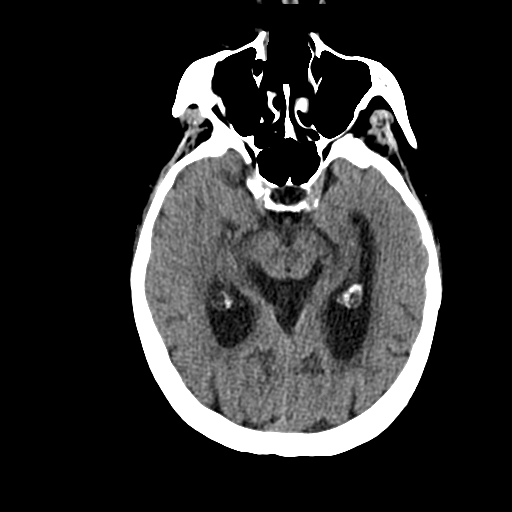
[im 18/39  bone]
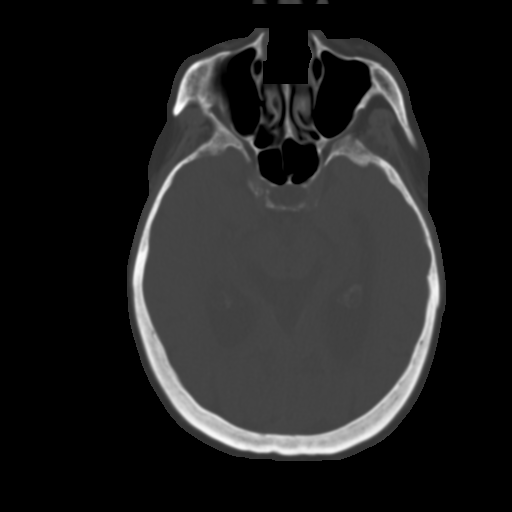
[im 21/39  brain]
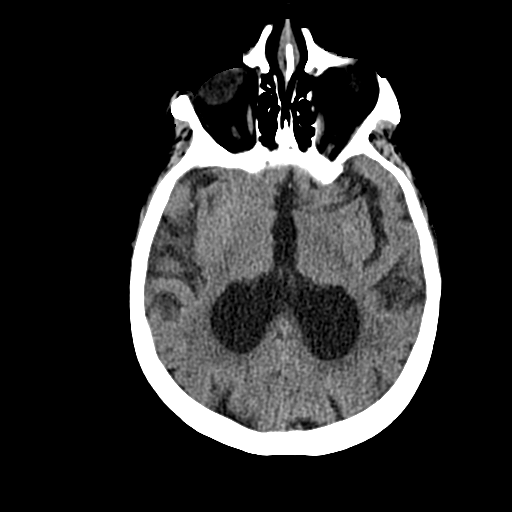
[im 25/39  brain]
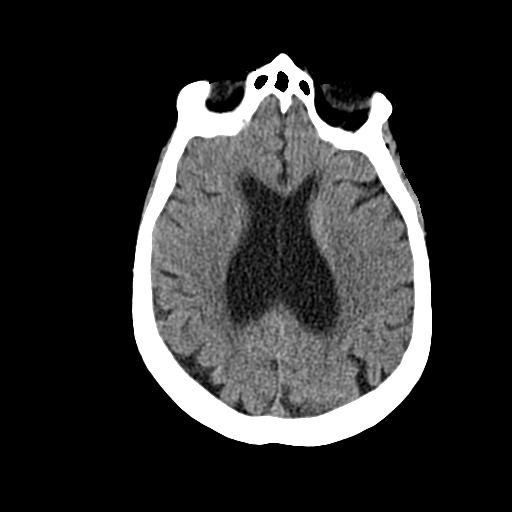
[im 29/39  brain]
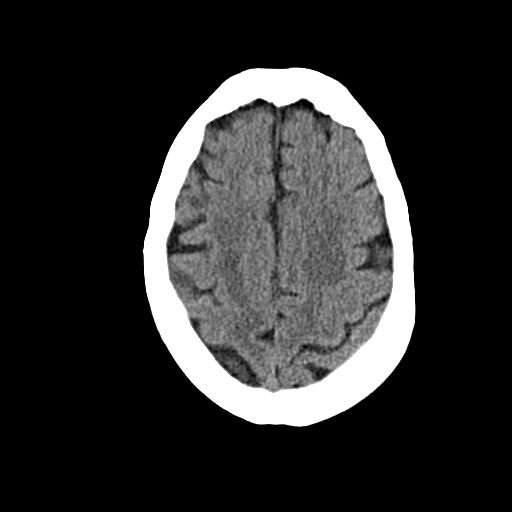
[im 32/39  brain]
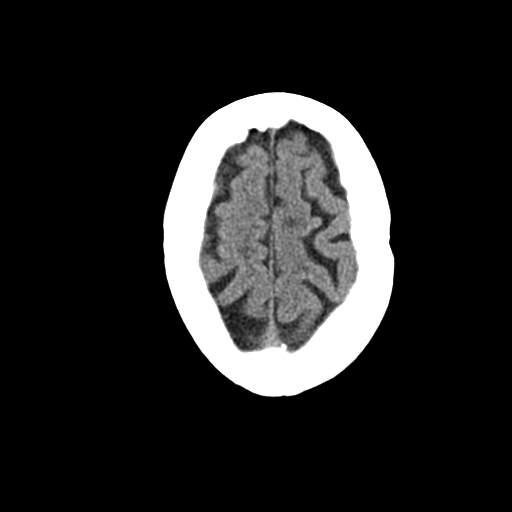
[im 32/39  bone]
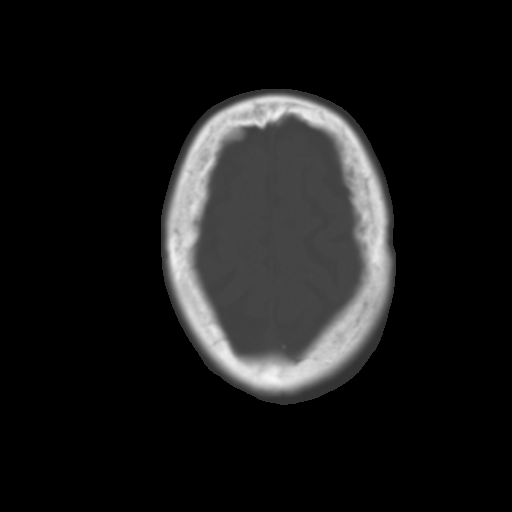
[im 36/39  brain]
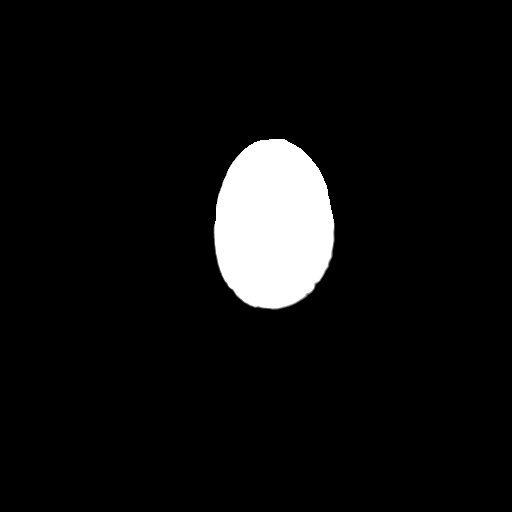

[Series 4: cor soft · coronal · 0.32mm/px · 3 of 75 slices shown]
[im 25/75  brain]
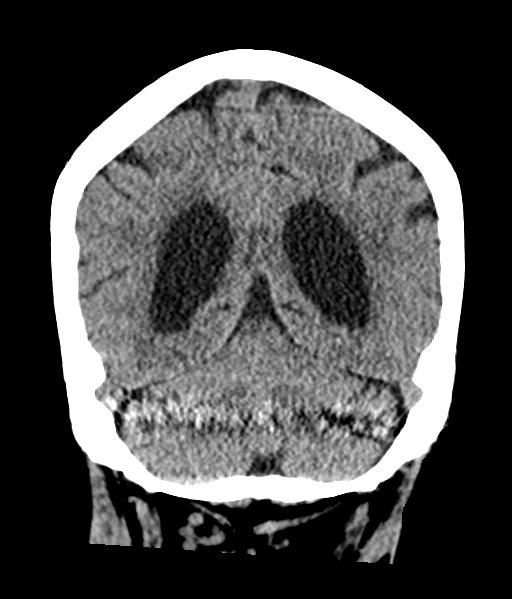
[im 33/75  brain]
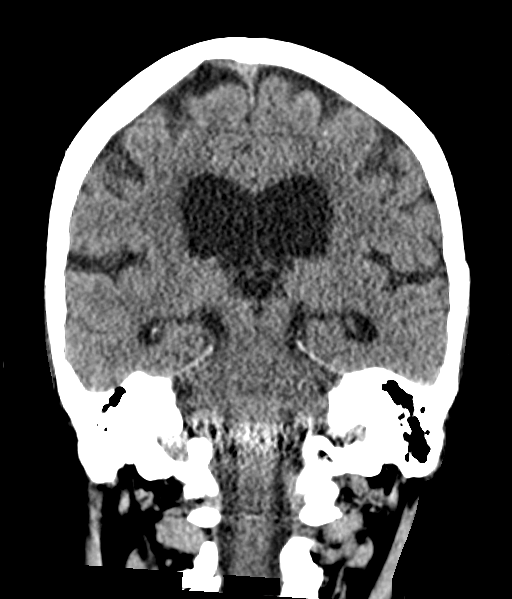
[im 42/75  brain]
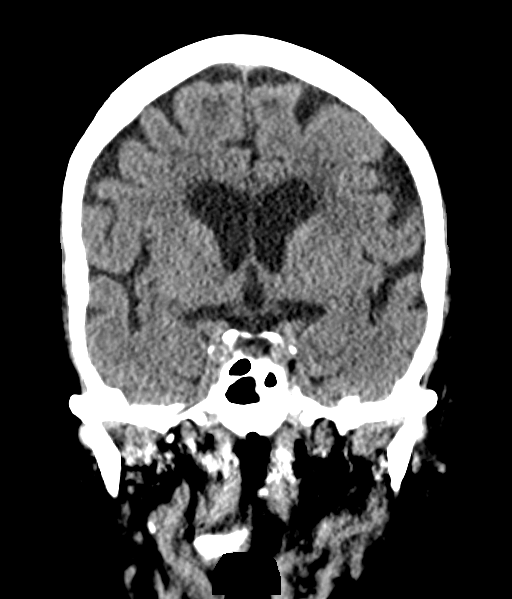

[Series 5: sag soft · sagittal · 0.40mm/px · 3 of 56 slices shown]
[im 19/56  brain]
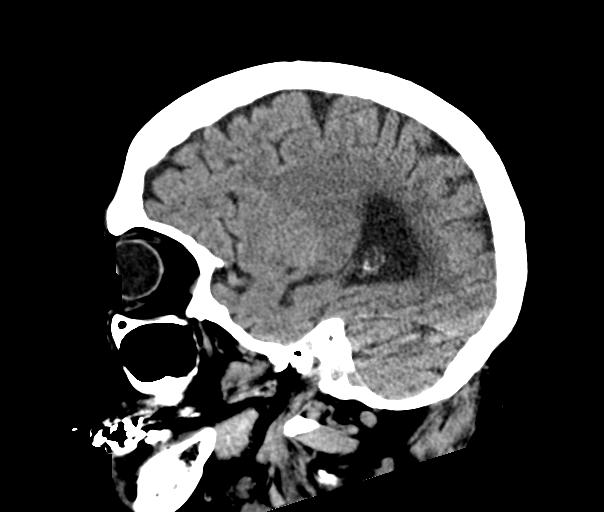
[im 28/56  brain]
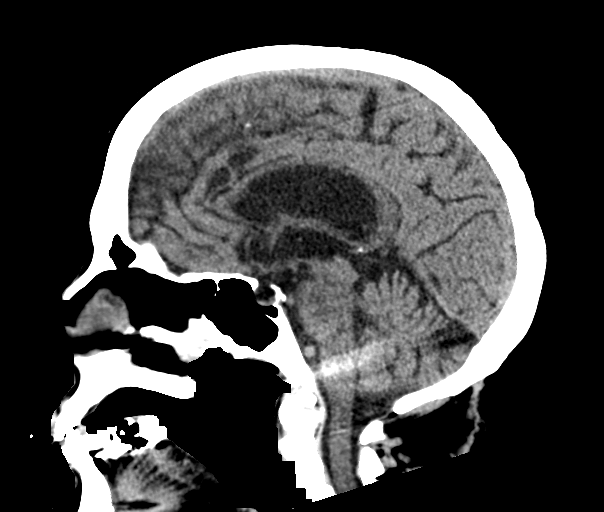
[im 37/56  brain]
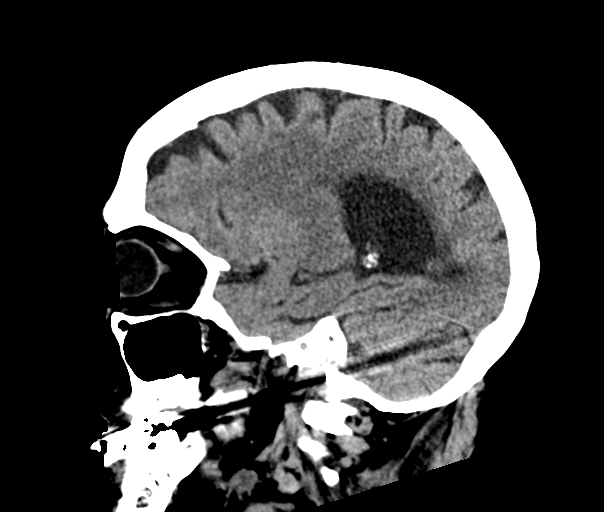

[16 of 47 positions shown; findings below may reference images not displayed]

FINDINGS: Brain: There is no mass, hemorrhage or extra-axial collection. The
appearance of the white matter is normal for the patient's age.
There is age-advanced atrophy.

Vascular: No abnormal hyperdensity of the major intracranial
arteries or dural venous sinuses. No intracranial atherosclerosis.

Skull: The visualized skull base, calvarium and extracranial soft
tissues are normal.

Sinuses/Orbits: No fluid levels or advanced mucosal thickening of
the visualized paranasal sinuses. No mastoid or middle ear effusion.
The orbits are normal.
IMPRESSION: 1. No acute intracranial abnormality.
2. Age-advanced atrophy.

## 2022-08-30 NOTE — Telephone Encounter (Signed)
Pt called to inquire about her referral to the neuro, informed her of the notes & advised her to cb on Friday if she has not heard anything from either office

## 2022-08-30 NOTE — Telephone Encounter (Signed)
Referral was changed to [facility] and information has been faxed over.

## 2022-10-06 ENCOUNTER — Other Ambulatory Visit: Payer: Self-pay | Admitting: Family Medicine

## 2022-10-06 DIAGNOSIS — F325 Major depressive disorder, single episode, in full remission: Secondary | ICD-10-CM

## 2022-10-06 DIAGNOSIS — F419 Anxiety disorder, unspecified: Secondary | ICD-10-CM

## 2022-10-06 NOTE — Telephone Encounter (Signed)
Advise pt that the rx was sent in for a new dose-- 100mg  (same as the 2 tabs she was taking). She should only take ONE tablet each night, since I raised the dose.

## 2022-10-06 NOTE — Telephone Encounter (Signed)
Refill request last filled 07/11/22, last apt 06/15/22, next apt 12/28/22.

## 2022-10-06 NOTE — Telephone Encounter (Signed)
Please check to see if the 1.5 tablets is still working well for her sleep (at her visit in August we had discussed that she could increase to 2 tablets, if needed, and then I would change to the 100mg  dose).  She was using too much tylenol PM at her visit in August, which is when the dose was raised. See if she is taking 1.5 vs 2 tablets, and if this helps her sleep, or how much other OTC medication she is also taking to help her sleep, if any. That way I can determine if she needs to try the higher dose (to stop the riskier OTC meds), or continue at the 1.5 tablets if that is working well for her.  If her sleep is good on the current dose (1.5 tabs, if that's still what she is taking), and not taking  a lot of OTC meds also, then you can refill this for another #135.

## 2022-10-08 ENCOUNTER — Other Ambulatory Visit: Payer: Self-pay | Admitting: Family Medicine

## 2022-10-08 DIAGNOSIS — E782 Mixed hyperlipidemia: Secondary | ICD-10-CM

## 2022-10-24 DIAGNOSIS — G629 Polyneuropathy, unspecified: Secondary | ICD-10-CM | POA: Diagnosis not present

## 2022-10-31 ENCOUNTER — Encounter: Payer: Self-pay | Admitting: Podiatry

## 2022-11-03 ENCOUNTER — Other Ambulatory Visit: Payer: Self-pay | Admitting: Family Medicine

## 2022-11-03 DIAGNOSIS — F419 Anxiety disorder, unspecified: Secondary | ICD-10-CM

## 2022-11-03 DIAGNOSIS — F325 Major depressive disorder, single episode, in full remission: Secondary | ICD-10-CM

## 2022-11-09 NOTE — Telephone Encounter (Signed)
Can we send the NCV test to her neurologist? Thanks!

## 2022-11-13 ENCOUNTER — Encounter: Payer: Self-pay | Admitting: Neurology

## 2022-11-13 ENCOUNTER — Ambulatory Visit: Payer: Medicare HMO | Admitting: Neurology

## 2022-11-13 VITALS — BP 129/76 | HR 64 | Ht 63.0 in | Wt 141.8 lb

## 2022-11-13 DIAGNOSIS — R200 Anesthesia of skin: Secondary | ICD-10-CM

## 2022-11-13 DIAGNOSIS — G250 Essential tremor: Secondary | ICD-10-CM

## 2022-11-13 DIAGNOSIS — R413 Other amnesia: Secondary | ICD-10-CM | POA: Diagnosis not present

## 2022-11-13 DIAGNOSIS — R7301 Impaired fasting glucose: Secondary | ICD-10-CM | POA: Diagnosis not present

## 2022-11-13 DIAGNOSIS — G6289 Other specified polyneuropathies: Secondary | ICD-10-CM

## 2022-11-13 NOTE — Patient Instructions (Signed)
You likely have a condition called peripheral neuropathy, i. e. nerve damage. Unfortunately, as I mentioned, there is no specific treatment for most neuropathies. The most common cause for neuropathy is diabetes in this [place], in which case, tight glucose control is key.  Some studies suggest that obesity and prediabetes can also cause nerve damage even in the absence of a formal diagnosis of diabetes.    Other causes include thyroid disease, and some vitamin deficiencies. Certain medications such as chemotherapy agents and other chemicals or toxins including alcohol can cause neuropathy. There are some genetic conditions or hereditary neuropathies. Typically patients will report a family history of neuropathy in those conditions. There are cases associated with cancers and autoimmune conditions. Most neuropathies are progressive unless a root cause can be found and treated, which is rare, as I explained. For most neuropathies there is no actual cure or reversing of symptoms. Painful neuropathy can be difficult to treat symptomatically, but there are some medications available to ease the symptoms.  Thankfully, you have no significant pain symptoms at this time and can be monitored for symptoms.    Electrophysiologic testing with nerve conduction velocity studies and EMG (muscle testing) do not always pick up neuropathies that affect the smallest fibers. Other common tests include different type of blood work, and rarely, spinal fluid testing, and sometimes we resort to asking for a nerve and muscle biopsy.  We can also consider specialist input from a neuromuscular specialist, sometimes we make referrals to [facility] or [facility] or [facility].  For now, as discussed, we will proceed with further work-up from my end of things: We will check blood work today and call you with the test results.

## 2022-11-13 NOTE — Progress Notes (Signed)
Subjective:    Patient ID: Lindsey Frank is a 77 y.o. female.  HPI    Interim history:   Lindsey Frank is a 77 year old right-handed woman with an underlying medical history of reflux disease, hyperlipidemia, depression, essential tremor, history of endometriosis and borderline overweight state, who presents for follow-up consultation of her balance problem, recurrent falls and memory loss.  The patient is unaccompanied today. I last saw her on 11/14/2021, at which time she reported not taking donepezil any longer.  She stopped it after about a month she had no side effects from it but recalls that she had it on the list of medicines that she should not take.  She had stopped clonazepam and propranolol previously.  We talked about fall prevention and staying well-hydrated.  She was encouraged to quit smoking and avoid drinking alcohol daily.  She saw Lindsey AustinJessica McCue, NP in the interim on 05/15/2022, at which time she reported that her memory was stable.  Her MMSE was 30 out of 30 at the time.  She reported fluctuating numbness affecting both feet.  She was referred to physical therapy.  Today, 11/13/2022: She reports feeling stable with regards to her tremor.  She does report that it was worse after she came off of clonazepam which has been several months now.  Nevertheless, the tremor fluctuates and is not her biggest concern, she worries about her numbness in her feet, she has numbness affecting primarily her right more than left foot, particularly big toe area and heel areas.  She has not experience much in the way of pain or constant pain.  She does have low back pain but it did improve with physical therapy.  She has not fallen recently.  She is careful.  She has a walker and a cane available, she did not bring either today.  She tries to hydrate well and does not take any medication for back pain on a consistent basis, she has not had any daily alcohol any longer, she limits her caffeine to 1 cup  of hot tea every morning.  She had seen her podiatrist who ordered an EMG test for her.  She had an EMG nerve conduction velocity test through Dr. 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  Memory wise, she feels stable.  She had blood work through her primary care on 06/15/2022 and I reviewed the results: Vitamin B12 elevated at 1358, TSH normal at 1.87, A1c in the prediabetes range at 5.8.   Previously:    I saw her on 08/15/2021, at which time she reported working on smoking cessation.  She had come off clonazepam and propranolol.  I suggested we start her on low-dose Aricept generic.   I evaluated her on 05/23/2021, at which time she was found to have orthostatic hypotension.  She had a nonspecific gait instability and we talked about her fall risk.  She was advised to proceed with a brain MRI as well as carotid Doppler ultrasound.  She was advised to talk to her prescriber regarding her beta-blocker as she had mild bradycardia.  She was also advised to talk about her blood pressure management.  She was advised that clonazepam was a contributor to balance problems and cognitive sluggishness.   She had a brain MRI with and without contrast on 06/07/2021 I reviewed the results:   IMPRESSION:  Abnormal MRI brain (with and without) demonstrating: - Mild atrophy and ventriculomegaly on ex vacuo basis. - Mild chronic small vessel ischemic disease.  - No acute findings.   She had a carotid Doppler ultrasound on 06/09/2021 and I reviewed the results: Bilateral ICA had 1 to 39% stenosis.  Bilateral vertebral arteries demonstrated antegrade flow.  The patient was notified of her test results.  She had a recent head CT without contrast on 07/25/2021 through the emergency room with indication of headache, intracranial hemorrhage  suspected, headache after fall, concern for delayed bleed.  Decreased mobility, bilateral lower extremity weakness, constipation.  I reviewed the results:  IMPRESSION: No evidence of acute intracranial abnormality.   Generalized cerebral atrophy. Mild chronic small vessel ischemic changes within the cerebral white matter.   Tiny mucous retention cyst within the posterior right ethmoid air cells.   Small right mastoid effusion.   She had a head CT without contrast as well as cervical spine CT without contrast and maxillofacial CT without contrast on 07/16/2021 after a fall and I reviewed the results: IMPRESSION: 1. Mild cerebral atrophy and microvascular disease changes of the supratentorial brain. 2. No acute intracranial abnormality.   IMPRESSION: 1. No acute cervical spine fracture or subluxation. 2. Multilevel degenerative changes, most notably in the form of multilevel facet joint hypertrophy. IMPRESSION: Negative for acute fracture or dislocation of the facial bones.       05/23/21: (She) reports difficulty with her balance.  She has had some falls.  She reports intermittent vertigo type symptoms.  She feels that she has positional vertigo off and on since she fell and hit her head several weeks ago.  She had a fall in March 2022 at the grocery store per her friend.  She reports that her shopping cart was pushed out of the way and she was actually leaning on the cart and that is why she fell.  She has had intermittent weakness affecting her legs but no sudden onset of one-sided weakness or numbness or tingling or droopy face or slurring of speech.  Lindsey Frank is also worried about her memory loss which has been ongoing for the past few months.  Patient is not concerned about memory issues but does report word finding difficulty and losing her train of thought, some forgetfulness.  She has not driven a car in the past several weeks.  She is not aware of any family history of memory loss or  dementia.  She has been using a 4 wheeled walker for the past week or so.  Prior to that she used a 2 wheeled walker and a 4 pronged cane.  She quit smoking about a month ago and has been on a nicotine patch.  She drinks alcohol rarely.  She drinks caffeine in the form of coffee, 1 cup/day.  She does not drink a whole lot of water, estimates that she drinks about 3-4 8 ounce bottles of water per day.  She likes to drink caffeine free diet soda. I reviewed your office note from 05/18/2021.  She had a recent head CT without contrast on 05/11/2021 after a fall.  I reviewed the results: Impression: No acute or traumatic finding.  Age related atrophy.  She recently into [facility].  She has fallen several times, unclear how many times, no injuries thankfully, no loss of consciousness. She has felt lightheaded upon standing.  She feels off balance when she suddenly stands up.  She has had intermittent symptoms of spinning sensation especially  when she turns in bed.  She has not seen ENT.   Of note, she is on long-acting Inderal 80 mg once daily and she has been on clonazepam.  She is currently on 1 mg strength twice daily.  She has been on clonazepam for years.   I had seen her several years ago for essential tremor.  Prior to that she had seen Dr. Sandria Manly. She has had no recent blood work in the past couple of months with the exception of A1c on 03/24/2021, which was 5.7.       07/14/14: 77 year old right-handed woman with an underlying medical history of reflux disease, migraine headaches, depression, hyperlipidemia, status post appendectomy, tonsillectomy and adenoidectomy, as well as laparoscopic surgery for endometriosis, who presents for followup consultation of her essential tremor. She is unaccompanied today. I first met her on 01/08/2014 at the request of her primary care physician, at which time she reported a long-standing history of essential tremor. She used to see Dr. Epimenio Foot in [facility] in  the past, but had not seen any neurologist in years. At the time of her first visit with me I suggested changing her nadolol to Inderal LA 80 mg once daily. I did keep her on her clonazepam, but suggested we consider tapering the clonazepam in the long run.   Today, she reports that she could not tolerate propranolol. She went back to nadolol and continues with clonazepam.   She was stable with with clonazepam and nadolol for years. She retired in 2011. She also saw Dr. Sandria Manly in or around 2011 and was tested for MS, but was told she did not have MS. She could not tolerate mysoline, which made her sick. Nadolol is expensive for. She has no FHx of ET, but there is a FHx of dementia and possibly PD. Her symptoms started in the 90s and she had a head tremor and bilateral hand tremors, and she feels she has progressed. She takes nadolol 40 mg in AM and 80 mg at night. Dr. Sandria Manly encouraged her to come off of clonazepam. She tried other meds, but no gabapentin or topamax. She has not been on propranolol. She rarely drinks alcohol. She smokes about 1/2 ppd. She is trying to quit smoking.   The patient's allergies, current medications, family history, past medical history, past social history, past surgical history and problem list were reviewed and updated as appropriate.    Her Past Medical History Is Significant For: Past Medical History:  Diagnosis Date   Abnormal Pap smear of cervix 2019   HR HPV + recurrent    Depression    Elevated C-reactive protein (CRP) 01/2005   Elevated cholesterol 01/2005   Elevated triglycerides with high cholesterol 01/2005   GERD (gastroesophageal reflux disease)    History of endometriosis    Migraine    Tremor, essential     Her Past Surgical History Is Significant For: Past Surgical History:  Procedure Laterality Date   BREAST LUMPECTOMY Right 1992   small lumpectomy for benign tumor   CATARACT EXTRACTION Left 01/02/2018   Dr. Dagoberto Ligas   COSMETIC SURGERY  2008    neck lift   LAPAROSCOPY  1981   endometriosis   TONSILECTOMY, ADENOIDECTOMY, BILATERAL MYRINGOTOMY AND TUBES  1953   age 40   UTERINE FIBROID SURGERY  1977   with appendectomy    Her Family History Is Significant For: Family History  Problem Relation Age of Onset   Diabetes Mother    Heart disease Father  37   Stroke Father 70   Throat cancer Father    Depression Brother    Stomach cancer Maternal Aunt    Depression Maternal Uncle        suicide   Alzheimer's disease Cousin    Neuropathy Neg Hx     Her Social History Is Significant For: Social History   Socioeconomic History   Marital status: Divorced    Spouse name: Not on file   Number of children: 1   Years of education: college   Highest education level: Not on file  Occupational History   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, research (physical sciences))    Employer: [employer]  Tobacco Use   Smoking status: Some Days    Packs/day: 0.25    Years: 30.00    Total pack years: 7.50    Types: Cigarettes   Smokeless tobacco: Never   Tobacco comments:    quit 12/2014, relapsed with stress, quit again end of July 2016  Pt states she is on the patch, quit again 09/2021  Vaping Use   Vaping Use: Never used  Substance and Sexual Activity   Alcohol use: Yes    Alcohol/week: 0.0 - 1.0 standard drinks of alcohol    Comment: Aram Candela 2x/week   Drug use: No   Sexual activity: Not Currently  Other Topics Concern   Not on file  Social History Narrative   Lives alone.  Son lives in [place], 4 grandchildren   College Education   Right handed   Caffeine one cup daily (tea with milk).   Social Determinants of Health   Financial Resource Strain: Not on file  Food Insecurity: No Food Insecurity (07/22/2021)   Hunger Vital Sign    Worried About Running Out of Food in the Last Year: Never true    Ran Out of Food in the Last Year: Never true  Transportation Needs: No Transportation Needs (07/22/2021)   PRAPARE - Scientist, research (physical sciences) (Medical): No    Lack of Transportation (Non-Medical): No  Physical Activity: Not on file  Stress: Not on file  Social Connections: Not on file    Her Allergies Are:  Allergies  Allergen Reactions   Trazodone And Nefazodone Nausea Only   Mysoline [Primidone] Nausea Only    Nausea and achey  :   Her Current Medications Are:  Outpatient Encounter Medications as of 11/13/2022  Medication Sig   Calcium Carbonate-Vitamin D 600-200 MG-UNIT TABS Take 600 mg by mouth 2 (two) times daily.   omeprazole (PRILOSEC) 20 MG capsule Take 20 mg by mouth daily.   rosuvastatin (CRESTOR) 40 MG tablet TAKE 1 TABLET BY MOUTH EVERY DAY   sertraline (ZOLOFT) 100 MG tablet Take 1 tablet (100 mg total) by mouth at bedtime.   vitamin B-12 (CYANOCOBALAMIN) 1000 MCG tablet Take 1 tablet (1,000 mcg total) by mouth daily.   [DISCONTINUED] diphenhydramine-acetaminophen (TYLENOL PM) 25-500 MG TABS tablet Take 3 tablets by mouth at bedtime as needed (sleep).   No facility-administered encounter medications on file as of 11/13/2022.  :  Review of Systems:  Out of a complete 14 point review of systems, all are reviewed and negative with the exception of these symptoms as listed below:  Review of Systems  Neurological:        Pt here for numbness in toes and feet, had [facility].(Report here).  She also mentioned these things:  tremors (hands and head) worse off clonazepam.  Sleeping (has trouble  sleeping 2 x week), low back pain (has seen pcp).      Objective:  Neurological Exam  Physical Exam Physical Examination:   Vitals:   11/13/22 1055  BP: 129/76  Pulse: 64    General Examination: The patient is a very pleasant 77 y.o. female in no acute distress. She appears well-developed and well-nourished and well groomed.   HEENT: Normocephalic, atraumatic, pupils are equal, round and reactive to light, corrective eyeglasses in place, tracking mildly impaired but stable.   She has an intermittent head tremor, no voice tremor today.  Neck is supple, no carotid bruits. Airway exam reveals mild mouth dryness, tongue protrudes centrally and palate elevates symmetrically.    Chest: Clear to auscultation without wheezing, rhonchi or crackles noted.   Heart: S1+S2+0, regular and normal without murmurs, rubs or gallops noted.   Abdomen: Soft, non-tender and non-distended.   Extremities: There is no edema in the distal lower extremities bilaterally.     Skin: Warm and dry without trophic changes noted.    Musculoskeletal: exam reveals no obvious joint deformities.   Neurologically:   Mental status: The patient is awake, alert and oriented in all 4 spheres. Her immediate and remote memory are mildly impaired.    Thought process is linear. Mood is normal and affect is normal.     05/15/2022   10:32 AM 11/14/2021    8:58 AM 05/23/2021    9:05 AM  MMSE - Mini Mental State Exam  Orientation to time 5 4 2   Orientation to Place 5 5 5   Registration 3 3 3   Attention/ Calculation 5 5 2   Recall 3 2 1   Language- name 2 objects 2 2 2   Language- repeat 1 1 1   Language- follow 3 step command 3 3 2   Language- read & follow direction 1 0 1  Write a sentence 1 0 1  Copy design 1 1 0  Total score 30 26 20     On 05/23/2021: CDT: 4/4, AFT: 2/min.  On 11/14/2021: CDT: 4/4, AFT: 9/min.   Cranial nerves II - XII are as described above under HEENT exam.  Motor exam: Normal bulk, strength and tone is noted. There is no drift, or rebound. There is no resting tremor. There is a slight postural tremor, mild bilateral upper extremity action tremor, no intention tremor.  No lower extremity tremor.   Reflexes are 1+ in the upper extremities, trace in the knees and absent in the ankles, toes are downgoing bilaterally.  Fine motor skills are generally fairly well-preserved with no decrement in amplitude with hand movements or foot movements.   Cerebellar testing: No dysmetria or  intention tremor.  Sensory exam: In the upper and lower extremities she has intact sensation to light touch, decreased sensation to pinprick in the feet as well as decreased vibration sense up to the ankles bilaterally.  Sensory exam preserved in the hands. Gait, station and balance: She stands without difficulty but pushes herself up.  Posture is mildly stooped for age, does not have a walking aid.   Assessment and Plan:  In summary, ALEXSANDRA SHONTZ is a 77 year old female with an underlying medical history of reflux disease, hyperlipidemia, depression, essential tremor, history of endometriosis, prediabetes, and borderline overweight state, who presents for follow-up consultation of her gait and balance problems, history of falls, and memory loss as well as evidence of neuropathy.  She had EMG nerve conduction velocity testing with Dr. Kirstie Mirza office on 10/24/2022 indicating moderate axonal and demyelinating  neuropathy.  She has symptoms in the lower extremities particularly her feet, she has no significant pain thankfully.  Workup in the past for her gait disorder included a brain MRI and carotid Doppler exam, she also had a head CT after a fall in the past.  For her tremor she has been off of clonazepam and a beta-blocker, she had evidence of orthostatic hypotension.  She was started on Aricept in October 2022 for memory loss but stopped it after about a month.  Her memory has been stable, latest MMSE last year was 30 out of 30.  We can continue to monitor.  We talked about causes of neuropathy.  She is advised to proceed with additional blood work today but also advised that daily alcohol consumption may contribute to nerve damage, she has thankfully stopped using alcohol daily.  She is reminded of her fall risk and advised to use caution and use her walking aids as needed.  She is reminded to stay well-hydrated.  Prediabetes is also sometimes a cause for neuropathy, does not have to be frank diabetes I  explained to her.  We will proceed with additional blood work today and call her with the results.  I did not suggest any new medications today for her, particularly since she does not have significant pain with her numbness in the feet.  She is advised to follow-up to see one of our nurse practitioners routinely in 6 months for recheck.  We will keep her posted as to her blood test results by phone call in the interim.  She has a history of low back pain but this improved with physical therapy.  I answered all her questions today and she was in agreement. I spent 43 minutes in total face-to-face time and in reviewing records during pre-charting, more than 50% of which was spent in counseling and coordination of care, reviewing test results, reviewing medications and treatment regimen and/or in discussing or reviewing the diagnosis of neuropathy, memory loss, tremor, balance problem, the prognosis and treatment options. Pertinent laboratory and imaging test results that were available during this visit with the patient were reviewed by me and considered in my medical decision making (see chart for details).

## 2022-11-21 LAB — COMPREHENSIVE METABOLIC PANEL
ALT: 17 IU/L (ref 0–32)
AST: 24 IU/L (ref 0–40)
Albumin/Globulin Ratio: 2.7 — ABNORMAL HIGH (ref 1.2–2.2)
Albumin: 4.9 g/dL — ABNORMAL HIGH (ref 3.8–4.8)
Alkaline Phosphatase: 83 IU/L (ref 44–121)
BUN/Creatinine Ratio: 14 (ref 12–28)
BUN: 13 mg/dL (ref 8–27)
Bilirubin Total: 0.4 mg/dL (ref 0.0–1.2)
CO2: 25 mmol/L (ref 20–29)
Calcium: 10 mg/dL (ref 8.7–10.3)
Chloride: 105 mmol/L (ref 96–106)
Creatinine, Ser: 0.94 mg/dL (ref 0.57–1.00)
Globulin, Total: 1.8 g/dL (ref 1.5–4.5)
Glucose: 97 mg/dL (ref 70–99)
Potassium: 4.6 mmol/L (ref 3.5–5.2)
Sodium: 143 mmol/L (ref 134–144)
Total Protein: 6.7 g/dL (ref 6.0–8.5)
eGFR: 63 mL/min/{1.73_m2} (ref >59)

## 2022-11-21 LAB — MULTIPLE MYELOMA PANEL, SERUM
Albumin SerPl Elph-Mcnc: 4.1 g/dL (ref 2.9–4.4)
Albumin/Glob SerPl: 1.6 (ref 0.7–1.7)
Alpha 1: 0.2 g/dL (ref 0.0–0.4)
Alpha2 Glob SerPl Elph-Mcnc: 0.8 g/dL (ref 0.4–1.0)
B-Globulin SerPl Elph-Mcnc: 1 g/dL (ref 0.7–1.3)
Gamma Glob SerPl Elph-Mcnc: 0.6 g/dL (ref 0.4–1.8)
Globulin, Total: 2.6 g/dL (ref 2.2–3.9)
IgA/Immunoglobulin A, Serum: 90 mg/dL (ref 64–422)
IgG (Immunoglobin G), Serum: 660 mg/dL (ref 586–1602)
IgM (Immunoglobulin M), Srm: 40 mg/dL (ref 26–217)

## 2022-11-21 LAB — PROTEIN ELECTROPHORESIS, SERUM

## 2022-11-21 LAB — ANA W/REFLEX: Anti Nuclear Antibody (ANA): NEGATIVE

## 2022-11-21 LAB — HEAVY METALS PROFILE II, BLOOD
Arsenic: 3 ug/L (ref 0–9)
Cadmium: 1.1 ug/L (ref 0.0–1.2)
Lead, Blood: <1 ug/dL (ref 0.0–3.4)
Mercury: 1.9 ug/L (ref 0.0–14.9)

## 2022-11-21 LAB — HGB A1C W/O EAG: Hgb A1c MFr Bld: 6.2 % — ABNORMAL HIGH (ref 4.8–5.6)

## 2022-11-21 LAB — C-REACTIVE PROTEIN: CRP: 4 mg/L (ref 0–10)

## 2022-11-21 LAB — RHEUMATOID FACTOR: Rheumatoid fact SerPl-aCnc: 10 IU/mL (ref <14.0)

## 2022-11-21 LAB — VITAMIN B6: Vitamin B6: 8.4 ug/L (ref 3.4–65.2)

## 2022-11-21 LAB — CK: Total CK: 70 U/L (ref 32–182)

## 2022-11-21 LAB — VITAMIN B1: Thiamine: 130.4 nmol/L (ref 66.5–200.0)

## 2022-11-22 ENCOUNTER — Telehealth: Payer: Self-pay

## 2022-11-22 NOTE — Telephone Encounter (Signed)
I called patient to discuss. No answer, left a message asking her to call us back. Sent a [employer].

## 2022-11-22 NOTE — Telephone Encounter (Signed)
-----   Message from Star Age, MD sent at 11/21/2022  3:30 PM EST ----- Labs were benign with the exception of elevated hemoglobin A1c which is a marker for diabetes and prediabetes.  Her A1c was 6.2 which is in the prediabetes range.  As discussed, even prediabetes can predispose some patients to developing neuropathy and it could be in part a cause for her symptoms.  I would recommend follow-up with primary care and tracking prediabetes and talking about management with PCP.  A couple of test results are pending, we will update if there is any additional abnormality.

## 2022-11-23 NOTE — Telephone Encounter (Signed)
Spoke with patient and discussed her lab results as noted below by Dr Rexene Alberts. Patient verbalized understanding. She has an appt on March 14th with primary care, Dr Tomi Bamberger. I sent the results to Dr Tomi Bamberger. Pt verbalized appreciation for the call.

## 2022-11-30 ENCOUNTER — Other Ambulatory Visit: Payer: Self-pay | Admitting: Family Medicine

## 2022-11-30 DIAGNOSIS — F325 Major depressive disorder, single episode, in full remission: Secondary | ICD-10-CM

## 2022-11-30 DIAGNOSIS — F419 Anxiety disorder, unspecified: Secondary | ICD-10-CM

## 2022-12-27 NOTE — Patient Instructions (Incomplete)
  HEALTH MAINTENANCE RECOMMENDATIONS:  It is recommended that you get at least 30 minutes of aerobic exercise at least 5 days/week (for weight loss, you may need as much as 60-90 minutes). This can be any activity that gets your heart rate up. This can be divided in 10-15 minute intervals if needed, but try and build up your endurance at least once a week.  Weight bearing exercise is also recommended twice weekly.  Eat a healthy diet with lots of vegetables, fruits and fiber.  "Colorful" foods have a lot of vitamins (ie green vegetables, tomatoes, red peppers, etc).  Limit sweet tea, regular sodas and alcoholic beverages, all of which has a lot of calories and sugar.  Up to 1 alcoholic drink daily may be beneficial for women (unless trying to lose weight, watch sugars).  Drink a lot of water.  Calcium recommendations are 1200-1500 mg daily (1500 mg for postmenopausal women or women without ovaries), and vitamin D 1000 IU daily.  This should be obtained from diet and/or supplements (vitamins), and calcium should not be taken all at once, but in divided doses.  Monthly self breast exams and yearly mammograms for women over the age of 79 is recommended.  Sunscreen of at least SPF 30 should be used on all sun-exposed parts of the skin when outside between the hours of 10 am and 4 pm (not just when at beach or pool, but even with exercise, golf, tennis, and yard work!)  Use a sunscreen that says "broad spectrum" so it covers both UVA and UVB rays, and make sure to reapply every 1-2 hours.  Remember to change the batteries in your smoke detectors when changing your clock times in the spring and fall. Carbon monoxide detectors are recommended for your home.  Use your seat belt every time you are in a car, and please drive safely and not be distracted with cell phones and texting while driving.   Lindsey Frank , Thank you for taking time to come for your Medicare Wellness Visit. I appreciate your ongoing  commitment to your health goals. Please review the following plan we discussed and let me know if I can assist you in the future.   This is a list of the screening recommended for you and due dates:  Health Maintenance  Topic Date Due   Zoster (Shingles) Vaccine (1 of 2) Never done   Colon Cancer Screening  05/26/2018   DTaP/Tdap/Td vaccine (3 - Td or Tdap) 04/17/2020   COVID-19 Vaccine (4 - 2023-24 season) 06/16/2022   Medicare Annual Wellness Visit  11/24/2022   Pneumonia Vaccine  Completed   Flu Shot  Completed   DEXA scan (bone density measurement)  Completed   Hepatitis C Screening: USPSTF Recommendation to screen - Ages 18-79 yo.  Completed   HPV Vaccine  Aged Out   Your are past due for mammogram.  Please schedule this. You are past due for GYN and breast exam, with pap smear.  Please contact Dr. Sabra Heck (who is now at the new [facility] facility) for appointment.  Please bring [place] copies of your Living Will and [place] once completed and notarized so that it can be scanned into your medical chart.

## 2022-12-27 NOTE — Progress Notes (Signed)
Chief Complaint  Patient presents with   Medicare Wellness    Fasting AWV, no pap she said she will see Dr. Sabra Heck. She does not want colonoscopy or Cologuard. She will have mammogram at [facility]. Went to use commode yesterday and due to the numbness in her feet before she sat on commode she fell over, didn't hit the floor (waste basket caught her). She went back to bed and room was spinning. Still concerned about numbness in her feet and no one has done anything for this. Smokes about 4 cigarettes a week.    Lindsey Frank is a 77 y.o. female who presents for annual physical exam, Medicare wellness visit and follow-up on chronic medical conditions.    Impaired fasting glucose/pre-diabetes: she continues to try and limit her sweets. She likes chocolate, but tries to limit portions, mainly having 2 chocolate kisses daily. Occasional cookie, not daily. Limits rice, pasta, uses whole wheat bread. Doesn't eat a lot of starchy vegetables.  Alcohol--small amount of Tia Maria up to 2x/week.  Last A1c was 6.2% in 10/2022 (had been 5.8% in 11/2021.) This was checked by neuro, who advised patient that even pre-diabetes can potentially contribute to neuropathy, and to discuss with PCP.   Mixed hyperlipidemia:   She reports being compliant with her Crestor. Patient is reportedly following a low-fat, low cholesterol diet. Compliant with medications and denies medication side effects. Doesn't take fish oil, due to causing bad breath. TG slightly elevated, LDL good at 61 on last check. Due for recheck.   Lab Results  Component Value Date   CHOL 157 11/24/2021   HDL 70 11/24/2021   LDLCALC 61 11/24/2021   TRIG 157 (H) 11/24/2021   CHOLHDL 2.2 11/24/2021   GERD--She takes Prilosec OTC daily, and denies symptoms. She previously had recurrent heartburn if she missed medication in the past, hasn't missed any recently.  Denies dysphagia.   Depression--she stopped Sertraline a few years ago, didn't have any  recurrent depression after stopping (has had recurrent depression when stopped in the past, related to work stress), but she had problems with recurrent insomnia. OTC meds didn't help. She didn't tolerate trazadone due to nausea.  Sertraline was restarted at that time, at 50mg , and insomnia had resolved.  She again started having sleep trouble. In 11/2021 she reported taking tylenol PM every night with good results. In August 2023 she found that 2 tablets qHS became less effective and she bumped the dose to 3 pills. We increased her sertraline at that time, and she has been taking 100mg  and sleeping very well.  She no longer needs any Tylenol PM.  There is a strong family h/o suicide in her family (5 people), and because of this is not interested in trying to stop the sertraline again (and it is effective in treating her insomnia).  Vitamin B12 was noted to be low in 05/2021 (271). She continues to take oral supplements, and last level was normal/high.  She has chronic numbness/tingling in her feet, unchanged. Denies pain in her feet.   Lab Results  Component Value Date   VITAMINB12 1,358 (H) 06/15/2022   Tremor, neuropathy, balance problems, h/o frequent falls, memory loss:  under the care of neuro, last saw Dr. Rexene Alberts in 10/2022. She had seen NP in 04/2022, was referred to PT. Back pain resolved with PT.  She denies any recent falls, except for a stumble yesterday, as reported in nurse's notes. Tremor fluctuates, worse than when she was on meds, but tolerable (  no longer on propranolol or clonazepam). Memory has been stable (MMSE was 30/30 in 04/2022). She has persistent numbness in her feet, R>L, especially the big toe area and heels. She denies pain in her feet. She feels like this numbness affects her walking and balance. 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 Additional lab workup recently by neuro was normal.   Tobacco use--She smokes occasionally,  4/week.  She doesn't want to talk about this today. Just endorses that she still smokes some.   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 She saw Dr. Sabra Heck, and was due to see her again for repeat pap and HPV in 10/2019 (1 year follow-up).  She never scheduled this.  She was reminded of this at her subsequent physicals with me. She declined exam by me (today as well), and still hasn't scheduled with GYN. She plans to schedule.    Immunization History  Administered Date(s) Administered   Fluad Quad(high Dose 65+) 08/25/2019, 09/15/2020, 07/18/2021, 08/31/2022   Influenza Split 08/16/2012, 07/28/2014   Influenza, High Dose Seasonal PF 06/16/2015, 07/05/2016, 08/16/2017, 08/19/2018   PFIZER(Purple Top)SARS-COV-2 Vaccination 12/29/2019, 01/26/2020, 09/15/2020   Pneumococcal Conjugate-13 05/27/2014   Pneumococcal Polysaccharide-23 04/17/2010, 07/05/2016   Td 10/16/2000   Tdap 04/17/2010   Zoster, Live 03/03/2007   Declines COVID booster. Last Pap smear:  08/2018, normal (atrophy), +HR HPV; tested negative for 16, 18/45 in 10/2018 by Dr. Sabra Heck. Was due for 1 yr f/u pap, past due. Last mammogram: 09/2019 Last colonoscopy: 8/09; referred for Cologuard in 2019, 2020 and 2021, never done. Not interested in further colon cancer screening. Last DEXA:  08/2018, T-1.1 Left fem neck Dentist: every 6 months  Ophtho: regularly  Exercise:  Walks down the street and back, 15 minutes every day.  Using a walker. No weight-bearing exercise.  Thyroid screen: Lab Results  Component Value Date   TSH 1.870 06/15/2022     Patient Care Team: Rita Ohara, MD as PCP - General (Family Medicine) GYN (Dr. Suezanne Cheshire due to see Dentist (Dr. Lavonne Chick)   Ophtho (Dr. Satira Sark) GI (Dr. Amedeo Plenty at [facility], 2009)   Neuro (Dr. Rexene Alberts)  Depression Screening: [place] Office Visit from 12/28/2022 in [place]  PHQ-2 Total Score 0       Falls screen:     12/28/2022    9:37 AM 06/15/2022    10:43 AM 11/24/2021   10:29 AM 11/24/2021    9:24 AM 05/18/2021   10:55 AM  Fall Risk   Falls in the past year? 1 1 1  0 1  Number falls in past yr: 0 0 1 0 1  Comment fell yesterday in bathroom 12/27/22, slipped and was caught by waste basket fell off bed     Injury with Fall? 0 1 1 0 1  Comment  concussion, scrapes concussion, scrapes  scrape on knee and hit head in grocery store and at home  Risk for fall due to : No Fall Risks  History of fall(s);Other (Comment) No Fall Risks History of fall(s)  Follow up Falls evaluation completed  Falls evaluation completed;Education provided Falls evaluation completed Falls evaluation completed     Functional Status Survey: Is the patient deaf or have difficulty hearing?: No Does the patient have difficulty seeing, even when wearing glasses/contacts?: No Does the patient have difficulty concentrating, remembering, or making decisions?: No Does the patient have difficulty walking or climbing stairs?: No Does the patient have difficulty dressing or bathing?: No Does  the patient have difficulty doing errands alone such as visiting a doctor's office or shopping?: No  Mini-Cog Scoring: 5   Clock drawing was normal--minimal tremor noted in her circle.  End of Life Discussion:  Patient has a living will and medical power of attorney.  These are not in her chart.   PMH, PSH, SH and FH were reviewed and updated.  Outpatient Encounter Medications as of 12/28/2022  Medication Sig   Calcium Carbonate-Vitamin D 600-200 MG-UNIT TABS Take 600 mg by mouth 2 (two) times daily.   omeprazole (PRILOSEC) 20 MG capsule Take 20 mg by mouth daily.   rosuvastatin (CRESTOR) 40 MG tablet TAKE 1 TABLET BY MOUTH EVERY DAY   sertraline (ZOLOFT) 100 MG tablet Take 1 tablet (100 mg total) by mouth at bedtime.   vitamin B-12 (CYANOCOBALAMIN) 1000 MCG tablet Take 1 tablet (1,000 mcg total) by mouth daily.   No facility-administered encounter medications on file as of  12/28/2022.   Allergies  Allergen Reactions   Trazodone And Nefazodone Nausea Only   Mysoline [Primidone] Nausea Only    Nausea and achey    ROS: The patient denies anorexia, fever, vision changes, decreased hearing, ear pain, sore throat, breast concerns, chest pain, palpitations, syncope, dyspnea on exertion, cough, swelling, nausea, vomiting, diarrhea, constipation, abdominal pain, melena, hematochezia, indigestion/heartburn, hematuria, incontinence, dysuria, vaginal bleeding, discharge, odor or itch, genital lesions, joint pains, weakness, depression, anxiety, abnormal bleeding/bruising, or enlarged lymph nodes.  Some postnasal drainage, chronic, not currently too bothersome. Insomnia is well controlled Feels balance is affected due to the numbness in her feet. No falls (just a slip yesterday). Mild recurrent tremor since meds stopped, not bothersome. Memory is stable. Moods are good. She had some vertigo when she was in bed yesterday after she had stumbled.  Short-lived, hasn't recurred.   PHYSICAL EXAM:  BP 128/74   Pulse 60   Ht 5\' 3"  (1.6 m)   Wt 144 lb 12.8 oz (65.7 kg)   LMP 03/17/1999 (Approximate)   BMI 25.65 kg/m    Wt Readings from Last 3 Encounters:  12/28/22 144 lb 12.8 oz (65.7 kg)  11/13/22 141 lb 12.8 oz (64.3 kg)  06/15/22 137 lb 6.4 oz (62.3 kg)    General Appearance:   Alert, cooperative, no distress, appears stated age. Some occasional throat-clearing. Posture is poor, kyphotic, uses her arms to help her sit up straight when on the exam table (no issues when in chair).  Head:   Normocephalic, without obvious abnormality, atraumatic.  Eyes:   PERRL, conjunctiva/corneas clear, EOM's intact, fundi benign    Ears:   Normal TM's and external ear canals    Nose:   No drainage or sinus tenderness  Throat:   Normal mucosa, some white mucus present posterior OP  Neck:   Supple, no lymphadenopathy; thyroid: no enlargement/tenderness/ nodules; no carotid bruit or  JVD    Back:   Spine nontender, no CVA tenderness. Some kyphosis of lower thoracic spine    Lungs:   Clear to auscultation bilaterally without wheezes, rales or ronchi; respirations unlabored    Chest Wall:   No tenderness or deformity    Heart:   Regular rate and rhythm, S1 and S2 normal, no murmur, rub or gallop.   Breast Exam:   Exam declined, deferred to GYN   Abdomen:   Soft, non-tender, nondistended, normoactive bowel sounds, no masses, no hepatosplenomegaly    Genitalia:   Deferred to GYN   Rectal:   Deferred  Extremities:   No clubbing, cyanosis or edema    Pulses:   2+ and symmetric all extremities    Skin:   Skin color, texture, turgor normal, no rashes or lesions. Bruise at left hand (recent trauma per pt, hit on refrigerator)  Lymph nodes:   Cervical, supraclavicular nodes normal    Neurologic:   Normal strength, sensation; reflexes are hyperreflexic (3+) and symmetric throughout. Only minimal tremor noted on R, and no head-bobbing noted. Sensation not evaluated. Recently saw neurologist for full exam                              Psych:  Normal mood, affect, hygiene and grooming  Recent labs from neuro reviewed  Lab Results  Component Value Date   HGBA1C 6.2 (H) 11/13/2022   Fasting glucose 97    Chemistry      Component Value Date/Time   NA 143 11/13/2022 1142   K 4.6 11/13/2022 1142   CL 105 11/13/2022 1142   CO2 25 11/13/2022 1142   BUN 13 11/13/2022 1142   CREATININE 0.94 11/13/2022 1142   CREATININE 0.91 08/16/2017 1112      Component Value Date/Time   CALCIUM 10.0 11/13/2022 1142   ALKPHOS 83 11/13/2022 1142   AST 24 11/13/2022 1142   ALT 17 11/13/2022 1142   BILITOT 0.4 11/13/2022 1142     Multiple myeloma panel normal. Normal thiamine, B6, ANA, CRP, CK, RF Negative screen for heavy metals  ASSESSMENT/PLAN:  Annual physical exam  Medicare annual wellness visit, subsequent  Depression, major, in remission ([place]) - cont sertraline; insomnia better  controlled since dose was increased to 100mg . Continue - Plan: sertraline (ZOLOFT) 100 MG tablet  Impaired fasting glucose - reviewed proper diet, exercise, wt loss and risks. Reviewed metformin risks/SE. Recommended starting metformin due to neuropathy - Plan: metFORMIN (GLUCOPHAGE-XR) 500 MG 24 hr tablet  Mixed hyperlipidemia - due for recheck, on statin. Continue lowfat, low cholesterol diet - Plan: Lipid panel, rosuvastatin (CRESTOR) 40 MG tablet  Benign essential tremor - stable, not bothersome. Under care of neuro, no longer on beta blocker or clonazepam  B12 deficiency - continue supplements (had normal labs on oral meds)  Gastroesophageal reflux disease, unspecified whether esophagitis present - Wt loss and diet reviewed. 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  Memory has improved from 2022, MMSE normal in 04/2022  Tobacco use - Briefly counseled on risks of smoking, and encouraged complete cessation  Mixed axonal-demyelinating neuropathy - per EMG, in both feet. Under the care of neurology  Medication monitoring encounter - Plan: Lipid panel  Pap smear abnormality of cervix/human papillomavirus (HPV) positive - past due for repeat pap with GYN. Pt reports she will schedule with Dr. Sabra Heck for GYN exam  Screen for colon cancer - noncompliant in returning Cologuard. Screening options and risks of not screening reviewed. She prefers NOT to do any screening  Anxiety - controlled with zoloft.  (Clonazepam is for tremor); Patient declines trial of lower dose - Plan: sertraline (ZOLOFT) 100 MG tablet  Insomnia, unspecified type - resolved, since increasing dose of sertraline; continue 100mg  - Plan: sertraline (ZOLOFT) 100 MG tablet  Need for pneumococcal 20-valent conjugate vaccination - Plan: Pneumococcal conjugate vaccine 20-valent (Prevnar 20)   Discussed monthly self breast exams and yearly mammograms  (past due, reminded to schedule); at least 30  minutes of aerobic activity at least 5 days/week, weight bearing exercise 2x/week; proper sunscreen use reviewed; healthy diet, including goals of calcium and vitamin D intake and alcohol recommendations (less than or equal to 1 drink/day) reviewed; regular seatbelt use; changing batteries in smoke detectors. Immunization recommendations discussed--continue yearly high dose flu shots  COVID booster recommended, declined. [id number] given today. Shingrix recommended, risks/side effects reviewed, to get from pharmacy. Tdap due, to get from pharmacy.   Encouraged her to get the RSV vaccine in the Fall. Colon cancer screening recommendations reviewed, past due. She hasn't returned the Cologuard kit for the past 3 years, will not do Cologuard, hemoccult or colonoscopy. Understands the risk of much later diagnosis of cancer, declines any screening.  F/u 6 months (A1c at visit), and schedule CPE/AWV for 1 year  MOST form reviewed. Full Code, Full Care. Need copy of her living will/HC POA, asked to get.     Medicare Attestation I have personally reviewed: The patient's medical and social history Their use of alcohol, tobacco or illicit drugs Their current medications and supplements The patient's functional ability including ADLs,fall risks, home safety risks, cognitive, and hearing and visual impairment Diet and physical activities Evidence for depression or mood disorders  The patient's weight, height, BMI have been recorded in the chart.  I have made referrals, counseling, and provided education to the patient based on review of the above and I have provided the patient with a written personalized care plan for preventive services.

## 2022-12-28 ENCOUNTER — Encounter: Payer: Self-pay | Admitting: Family Medicine

## 2022-12-28 ENCOUNTER — Ambulatory Visit (INDEPENDENT_AMBULATORY_CARE_PROVIDER_SITE_OTHER): Payer: Medicare HMO | Admitting: Family Medicine

## 2022-12-28 VITALS — BP 128/74 | HR 60 | Ht 63.0 in | Wt 144.8 lb

## 2022-12-28 DIAGNOSIS — G6289 Other specified polyneuropathies: Secondary | ICD-10-CM

## 2022-12-28 DIAGNOSIS — K219 Gastro-esophageal reflux disease without esophagitis: Secondary | ICD-10-CM | POA: Diagnosis not present

## 2022-12-28 DIAGNOSIS — G319 Degenerative disease of nervous system, unspecified: Secondary | ICD-10-CM

## 2022-12-28 DIAGNOSIS — G250 Essential tremor: Secondary | ICD-10-CM | POA: Diagnosis not present

## 2022-12-28 DIAGNOSIS — Z0000 Encounter for general adult medical examination without abnormal findings: Secondary | ICD-10-CM | POA: Diagnosis not present

## 2022-12-28 DIAGNOSIS — E782 Mixed hyperlipidemia: Secondary | ICD-10-CM

## 2022-12-28 DIAGNOSIS — R69 Illness, unspecified: Secondary | ICD-10-CM | POA: Diagnosis not present

## 2022-12-28 DIAGNOSIS — F419 Anxiety disorder, unspecified: Secondary | ICD-10-CM

## 2022-12-28 DIAGNOSIS — Z720 Tobacco use: Secondary | ICD-10-CM

## 2022-12-28 DIAGNOSIS — Z23 Encounter for immunization: Secondary | ICD-10-CM | POA: Diagnosis not present

## 2022-12-28 DIAGNOSIS — Z1211 Encounter for screening for malignant neoplasm of colon: Secondary | ICD-10-CM | POA: Diagnosis not present

## 2022-12-28 DIAGNOSIS — E538 Deficiency of other specified B group vitamins: Secondary | ICD-10-CM | POA: Diagnosis not present

## 2022-12-28 DIAGNOSIS — R87618 Other abnormal cytological findings on specimens from cervix uteri: Secondary | ICD-10-CM

## 2022-12-28 DIAGNOSIS — R7301 Impaired fasting glucose: Secondary | ICD-10-CM

## 2022-12-28 DIAGNOSIS — Z5181 Encounter for therapeutic drug level monitoring: Secondary | ICD-10-CM

## 2022-12-28 DIAGNOSIS — F325 Major depressive disorder, single episode, in full remission: Secondary | ICD-10-CM | POA: Diagnosis not present

## 2022-12-28 DIAGNOSIS — G4700 Insomnia, unspecified: Secondary | ICD-10-CM

## 2022-12-28 MED ORDER — SERTRALINE HCL 100 MG PO TABS: 100.0000 mg | ORAL_TABLET | Freq: Every day | ORAL | 3 refills | Status: DC

## 2022-12-28 MED ORDER — METFORMIN HCL ER 500 MG PO TB24: 500.0000 mg | ORAL_TABLET | Freq: Every day | ORAL | 5 refills | Status: DC

## 2022-12-28 MED ORDER — ROSUVASTATIN CALCIUM 40 MG PO TABS: 40.0000 mg | ORAL_TABLET | Freq: Every day | ORAL | 3 refills | Status: DC

## 2022-12-29 LAB — LIPID PANEL
Chol/HDL Ratio: 2.2 ratio (ref 0.0–4.4)
Cholesterol, Total: 175 mg/dL (ref 100–199)
HDL: 79 mg/dL (ref >39)
LDL Chol Calc (NIH): 70 mg/dL (ref 0–99)
Triglycerides: 157 mg/dL — ABNORMAL HIGH (ref 0–149)
VLDL Cholesterol Cal: 26 mg/dL (ref 5–40)

## 2023-01-04 ENCOUNTER — Telehealth: Payer: Self-pay | Admitting: *Deleted

## 2023-01-04 NOTE — Telephone Encounter (Signed)
Received [facility] request for additional diagnosis code  for HGB A1c  R73.01 added (impaired fasting glucose).

## 2023-03-25 ENCOUNTER — Other Ambulatory Visit: Payer: Self-pay | Admitting: Family Medicine

## 2023-03-25 DIAGNOSIS — F325 Major depressive disorder, single episode, in full remission: Secondary | ICD-10-CM

## 2023-03-25 DIAGNOSIS — F419 Anxiety disorder, unspecified: Secondary | ICD-10-CM

## 2023-05-22 NOTE — Progress Notes (Unsigned)
No chief complaint on file.    CT neck 07/2021: Disc levels: Very mild anterior osteophyte formation is seen at the level of C5-C6.   There is marked severity narrowing of the anterior atlantoaxial articulation. Mild multilevel intervertebral disc space narrowing is seen throughout the cervical spine. This is slightly more prominent at the level of C7-T1.   Bilateral marked severity multilevel facet joint hypertrophy is noted.  She has had some ongoing numbness in her feet, for which she has seen neurologist (Dr. Frances Furbish). Last seen in 10/2022.  She had EMG nerve conduction velocity testing with Dr. Onnie Boer office on 10/24/2022 indicating moderate axonal and demyelinating neuropathy. She has symptoms in the lower extremities particularly her feet, she has no significant pain   She was supposed to f/u in 6 months--no appointment scheduled.   PMH, PSH, SH reviewed   ROS:    PHYSICAL EXAM:  LMP 03/17/1999 (Approximate)       ASSESSMENT/PLAN:

## 2023-05-23 ENCOUNTER — Ambulatory Visit (INDEPENDENT_AMBULATORY_CARE_PROVIDER_SITE_OTHER): Payer: Medicare HMO | Admitting: Family Medicine

## 2023-05-23 ENCOUNTER — Encounter: Payer: Self-pay | Admitting: Family Medicine

## 2023-05-23 ENCOUNTER — Other Ambulatory Visit: Payer: Self-pay | Admitting: Family Medicine

## 2023-05-23 VITALS — BP 138/80 | HR 84 | Ht 63.0 in | Wt 141.4 lb

## 2023-05-23 DIAGNOSIS — M62838 Other muscle spasm: Secondary | ICD-10-CM

## 2023-05-23 DIAGNOSIS — F17200 Nicotine dependence, unspecified, uncomplicated: Secondary | ICD-10-CM

## 2023-05-23 DIAGNOSIS — R200 Anesthesia of skin: Secondary | ICD-10-CM | POA: Diagnosis not present

## 2023-05-23 DIAGNOSIS — M542 Cervicalgia: Secondary | ICD-10-CM

## 2023-05-23 MED ORDER — METHOCARBAMOL 500 MG PO TABS: 500.0000 mg | ORAL_TABLET | Freq: Three times a day (TID) | ORAL | 0 refills | Status: DC | PRN

## 2023-05-23 MED ORDER — MELOXICAM 15 MG PO TABS: 15.0000 mg | ORAL_TABLET | Freq: Every day | ORAL | 0 refills | Status: DC

## 2023-05-23 NOTE — Telephone Encounter (Signed)
Change requested.

## 2023-05-23 NOTE — Patient Instructions (Addendum)
  Take meloxicam once daily with food. This is an anti-inflammatory similar to motrin. Do not use any ibuprofen/motrin/advil/aleve/naproxen, Goody or BC powder while taking the meloxicam. You MAY take tylenol along with it, if needed. Take this daily until your pain has resolved (you don't need to finish the bottle, stop when you are better). If it bothers your stomach, cut the pill in half. Be sure to take it with food. Continue your omeprazole daily.  Use the methocarbamol if needed for pain--this is a muscle relaxant. Don't mix with alcohol, and use caution with driving if it makes you drowsy (this is the least sedating muscle relaxant, but better to be safe).  You should continue to use heat as needed to the area (heating pad). You can also try patches such as SalonPas with lidocaine.  Do the neck stretches as shown (chin to chest, ear to shoulder and looking over your shoulder). Repeat these 10 times, 2-3 times/day.  If your symptoms aren't improving, we can send you to physical therapy. Contact us early next week if these measures aren't helping, and we can put in a referral.  Please quit smoking.  You can schedule a 6 month follow-up with Dr. Teofilo Pod office (neurologist)--she had mentioned a 6 month follow-up visit when you were seen in January.

## 2023-05-23 NOTE — Telephone Encounter (Signed)
I don't want to replace with flexeril. I'd rather use tizanidine. Change to tizanidine 2mg , 1-2 q 8 hours prn muscle spasm. Advise her of the change based on her insurance formulary, and that this might be more sedating than the robaxin was--ensure not driving while taking it, in case it causes drowsiness.  #15, no refill.

## 2023-05-23 NOTE — Telephone Encounter (Signed)
Rx changed and patient advised.

## 2023-05-23 NOTE — Addendum Note (Signed)
Addended by: [name] on: 05/23/2023 04:52 PM   Modules accepted: Level of Service

## 2023-06-20 ENCOUNTER — Other Ambulatory Visit: Payer: Self-pay | Admitting: Family Medicine

## 2023-06-20 DIAGNOSIS — R7301 Impaired fasting glucose: Secondary | ICD-10-CM

## 2023-06-20 NOTE — Telephone Encounter (Signed)
Left message for pt to call me back 

## 2023-07-04 NOTE — Progress Notes (Unsigned)
No chief complaint on file.  Patient presents for 6 month follow-up on chronic problems.  She was last seen last month with R sided neck pain, felt to be muscular (though has known degernative dz).  She was prescribed meloxicam and muscle relaxant (prescribed methocarbamol, wasn't covered, switched to Tizanidine). She was shown neck stretches, and advised to contact us for PT referral if not improving.  Impaired fasting glucose/pre-diabetes: she continues to try and limit her sweets. She likes chocolate, but tries to limit portions, mainly having 2 chocolate kisses daily. Has an occasional cookie, not daily. Limits rice, pasta, uses whole wheat bread. Doesn't eat a lot of starchy vegetables.  Alcohol--small amount of Tia Maria up to 2x/week.  Last A1c was 6.2% in 10/2022 (had been 5.8% in 11/2021.) She was started on metformin 500 mg daily at her physical in 12/2022 due to her neuropathy, to see if it would help. ***UPDATE    Mixed hyperlipidemia:   She reports being compliant with her Crestor, denies side effects. Patient is reportedly following a low-fat, low cholesterol diet. Doesn't take fish oil, due to causing bad breath. TG slightly elevated a 157 on last check, LDL was 70.  Lab Results  Component Value Date   CHOL 175 12/28/2022   HDL 79 12/28/2022   LDLCALC 70 12/28/2022   TRIG 157 (H) 12/28/2022   CHOLHDL 2.2 12/28/2022      GERD--She takes Prilosec OTC daily, and denies symptoms. She previously had recurrent heartburn if she missed medication in the past, hasn't missed any recently.  Denies dysphagia.   Depression--She has h/o depression, with recurrences when she tried stopping sertraline while she was still working.  She was able to stop it a few years ago, without any recurrent depression, but she had recurrent insomnia.  Prior to restarting sertraline she tried trazodone, didn't tolerate due to nausea.  Insomnia improved when sertraline was restarted (at 50 mg; ultimately had  worsening insomnia, started using tylenol PM--sertraline dose was increased to 100mg  and insomnia resolved, without use of OTC meds). She continues to take 100 mg of sertraline; denies depression and is sleeping well.   There is a strong family h/o suicide in her family (5 people), and because of this is not interested in trying to stop the sertraline again (and it is effective in treating her insomnia).   Vitamin B12 was noted to be low in 05/2021 (271). She continues to take oral supplements, and last level was normal/high.  She has chronic numbness/tingling in her feet, unchanged. Denies pain in her feet.   Lab Results  Component Value Date   VITAMINB12 1,358 (H) 06/15/2022      Tremor, neuropathy, balance problems, h/o frequent falls, memory loss:  under the care of neuro, last saw Dr. Frances Furbish in 10/2022.  Tremor fluctuates, worse than when she was on meds, but tolerable (no longer on propranolol or clonazepam). Memory has been stable (MMSE was 30/30 in 04/2022). She has persistent numbness in her feet, R>L, especially the big toe area and heels. She denies pain in her feet. She feels like this numbness affects her walking and balance. 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 Additional lab workup by neuro was normal. She has persistent numbness in the feet, which she feels affects her balance.  Neuro's notes suggested 6 month f/u.  We discussed this at her visit last month, but she doesn't have any f/u scheduled yet.   PMH, PSH, SH reviewed  ROS: Denies fever, chills, URI symptoms, headaches, shortness of breath, chest pain.  Denies nausea, vomiting, bowel changes, urinary complaints, bleeding, bruising, rash. Denies lightheadedness, vertigo, falls. No worsening of tremor. No change in numbness/tingling of feet Moods are good Sleeping well, no insomnia   PHYSICAL EXAM:  LMP 03/17/1999 (Approximate)   Wt Readings from Last 3 Encounters:   05/23/23 141 lb 6.4 oz (64.1 kg)  12/28/22 144 lb 12.8 oz (65.7 kg)  11/13/22 141 lb 12.8 oz (64.3 kg)   Pleasant female, in no distress. HEENT: conjunctiva and sclera are clear, EOMI.  Neck: no lymphadenopathy or mass, no bruit. Heart: regular rate and rhythm Lungs: clear bilaterally Abdomen: soft, nontender Extremities: no edema Back: no spinal or CVA tenderness.  Some kyphosis/poor posture with slouching forward. Neuro: alert, oriented. Walking without assistance, normal gait. Cranial nerves grossly iintact.  Normal speech.  No head bobbing, minimal tremor. Decreased sensation on feet. Hyper-reflexic throughout, upper and lower extremities. Psych: normal mood, affect, hygiene and grooming.   UPDATE EXAM--posture, neck tenderness, tremor/head bobbing? Hyper-reflexic, normal gait?    ASSESSMENT/PLAN:   A1c Flu/covid RSV from pharmacy   Any improvement in neuropathy (foot numbness) since starting the metformin in March??? If not, can consider stopping it, and have her f/u with Dr. Frances Furbish (as we discussed in August).   F/u in March for CPE as scheduled

## 2023-07-05 ENCOUNTER — Encounter: Payer: Self-pay | Admitting: Family Medicine

## 2023-07-05 ENCOUNTER — Ambulatory Visit (INDEPENDENT_AMBULATORY_CARE_PROVIDER_SITE_OTHER): Payer: Medicare HMO | Admitting: Family Medicine

## 2023-07-05 VITALS — BP 130/80 | HR 68 | Ht 63.0 in | Wt 141.6 lb

## 2023-07-05 DIAGNOSIS — R7301 Impaired fasting glucose: Secondary | ICD-10-CM | POA: Diagnosis not present

## 2023-07-05 DIAGNOSIS — G250 Essential tremor: Secondary | ICD-10-CM | POA: Diagnosis not present

## 2023-07-05 DIAGNOSIS — E782 Mixed hyperlipidemia: Secondary | ICD-10-CM

## 2023-07-05 DIAGNOSIS — K219 Gastro-esophageal reflux disease without esophagitis: Secondary | ICD-10-CM | POA: Diagnosis not present

## 2023-07-05 DIAGNOSIS — E538 Deficiency of other specified B group vitamins: Secondary | ICD-10-CM | POA: Diagnosis not present

## 2023-07-05 DIAGNOSIS — G629 Polyneuropathy, unspecified: Secondary | ICD-10-CM | POA: Diagnosis not present

## 2023-07-05 DIAGNOSIS — Z23 Encounter for immunization: Secondary | ICD-10-CM | POA: Diagnosis not present

## 2023-07-05 DIAGNOSIS — F325 Major depressive disorder, single episode, in full remission: Secondary | ICD-10-CM | POA: Diagnosis not present

## 2023-07-05 DIAGNOSIS — G319 Degenerative disease of nervous system, unspecified: Secondary | ICD-10-CM

## 2023-07-05 DIAGNOSIS — Z720 Tobacco use: Secondary | ICD-10-CM | POA: Diagnosis not present

## 2023-07-05 LAB — POCT GLYCOSYLATED HEMOGLOBIN (HGB A1C): Hemoglobin A1C: 6 % — AB (ref 4.0–5.6)

## 2023-07-05 MED ORDER — METFORMIN HCL ER 500 MG PO TB24
1000.0000 mg | ORAL_TABLET | Freq: Every day | ORAL | 1 refills | Status: DC
Start: 2023-07-05 — End: 2024-01-02

## 2023-07-05 NOTE — Patient Instructions (Addendum)
Increase your metformin to 2 daily (take both together, before breakfast). We are going to try to see if this helps your neuropathy further, since 1 pill daily did make a difference.  Schedule routine follow-up with the neurologist.  Consider trying the prilosec OTC every other day--be sure not to skip days when you may  have a triggering meal (large meal, eating late, spicy, citrus, acidic, tomatoes, etc).  I would encourage you to get a COVID booster. I also recommend getting the RSV vaccine from the pharmacy. Wait a couple of weeks from today's flu shot to get it.  Please work on trying to eliminate smoking entirely. Consider using nicotine lozenges, gum, or think of other alternatives when you are planning to have a cigarette.

## 2023-07-13 ENCOUNTER — Other Ambulatory Visit: Payer: Self-pay | Admitting: Family Medicine

## 2023-07-13 DIAGNOSIS — M542 Cervicalgia: Secondary | ICD-10-CM

## 2023-07-26 ENCOUNTER — Encounter (HOSPITAL_BASED_OUTPATIENT_CLINIC_OR_DEPARTMENT_OTHER): Payer: Medicare HMO | Admitting: Obstetrics & Gynecology

## 2023-08-01 ENCOUNTER — Other Ambulatory Visit: Payer: Self-pay | Admitting: Family Medicine

## 2023-08-01 DIAGNOSIS — G629 Polyneuropathy, unspecified: Secondary | ICD-10-CM

## 2023-08-01 DIAGNOSIS — M542 Cervicalgia: Secondary | ICD-10-CM

## 2023-08-01 DIAGNOSIS — R7301 Impaired fasting glucose: Secondary | ICD-10-CM

## 2023-10-18 ENCOUNTER — Encounter: Payer: Self-pay | Admitting: Family Medicine

## 2023-10-18 ENCOUNTER — Ambulatory Visit: Payer: Medicare HMO | Admitting: Family Medicine

## 2023-10-18 VITALS — BP 120/70 | HR 60 | Temp 98.7°F | Ht 63.0 in | Wt 138.0 lb

## 2023-10-18 DIAGNOSIS — R233 Spontaneous ecchymoses: Secondary | ICD-10-CM | POA: Diagnosis not present

## 2023-10-18 DIAGNOSIS — R21 Rash and other nonspecific skin eruption: Secondary | ICD-10-CM

## 2023-10-18 LAB — CBC WITH DIFFERENTIAL/PLATELET
Basophils Absolute: 0.1 10*3/uL (ref 0.0–0.2)
Basos: 1 %
EOS (ABSOLUTE): 0.3 10*3/uL (ref 0.0–0.4)
Eos: 4 %
Hematocrit: 38.1 % (ref 34.0–46.6)
Hemoglobin: 12 g/dL (ref 11.1–15.9)
Immature Grans (Abs): 0 10*3/uL (ref 0.0–0.1)
Immature Granulocytes: 0 %
Lymphocytes Absolute: 1.9 10*3/uL (ref 0.7–3.1)
Lymphs: 22 %
MCH: 27.3 pg (ref 26.6–33.0)
MCHC: 31.5 g/dL (ref 31.5–35.7)
MCV: 87 fL (ref 79–97)
Monocytes Absolute: 0.7 10*3/uL (ref 0.1–0.9)
Monocytes: 8 %
Neutrophils Absolute: 5.5 10*3/uL (ref 1.4–7.0)
Neutrophils: 65 %
Platelets: 254 10*3/uL (ref 150–450)
RBC: 4.4 x10E6/uL (ref 3.77–5.28)
RDW: 14.1 % (ref 11.7–15.4)
WBC: 8.4 10*3/uL (ref 3.4–10.8)

## 2023-10-18 NOTE — Progress Notes (Signed)
 Chief Complaint  Patient presents with   Rash    Rash on lower legs that started Sunday, R worse than L. Feels like she has an elastic band on the top of her feet.    Noticed a rash on both lower legs upon returning from [place] on Sunday 12/29. She noticed it when she took off her pants. She denies any itching or discomfort. Denies any new products--stayed at her son's, uses same soap, doesn't shave. No new socks. Denies any bleeding or bruising elsewhere No rash elsewhere on her body. Wasn't outside (was raining)     PMH, PSH, SH reviewed  Outpatient Encounter Medications as of 10/18/2023  Medication Sig Note   Calcium  Carbonate-Vitamin D 600-200 MG-UNIT TABS Take 600 mg by mouth 2 (two) times daily.    metFORMIN  (GLUCOPHAGE -XR) 500 MG 24 hr tablet Take 2 tablets (1,000 mg total) by mouth daily with breakfast.    omeprazole (PRILOSEC) 20 MG capsule Take 20 mg by mouth daily.    rosuvastatin  (CRESTOR ) 40 MG tablet Take 1 tablet (40 mg total) by mouth daily.    sertraline  (ZOLOFT ) 100 MG tablet Take 1 tablet (100 mg total) by mouth at bedtime.    vitamin B-12 (CYANOCOBALAMIN) 1000 MCG tablet Take 1 tablet (1,000 mcg total) by mouth daily.    acetaminophen  (TYLENOL ) 500 MG tablet Take 1,000 mg by mouth every 6 (six) hours as needed. (Patient not taking: Reported on 10/18/2023) 10/18/2023: As needed   No facility-administered encounter medications on file as of 10/18/2023.   Allergies  Allergen Reactions   Trazodone  And Nefazodone Nausea Only   Mysoline [Primidone] Nausea Only    Nausea and achey    ROS: No fever, chills, URI symptoms, headaches, dizziness. No tick bites. No n/v/d, chest pain, shortness of breath, or other concerns other than rash on legs. See HPI    PHYSICAL EXAM:  BP 120/70   Pulse 60   Temp 98.7 F (37.1 C) (Tympanic)   Ht 5' 3 (1.6 m)   Wt 138 lb (62.6 kg)   LMP 03/17/1999 (Approximate)   BMI 24.45 kg/m   Wt Readings from Last 3 Encounters:  10/18/23  138 lb (62.6 kg)  07/05/23 141 lb 9.6 oz (64.2 kg)  05/23/23 141 lb 6.4 oz (64.1 kg)   Well-appearing, pleasant female, in good spirits. HEENT: conjunctiva and sclera are clear, EOMI. Heart: regular rate and rhythm Lungs: clear bilaterally Abdomen: soft, nontender Extremities: no edema, 2+ pulses. Some petechial lesions on the LLE, near ankle, mainly anteriorly. RLE--rash/lesions appear more linear/curvilinear in nature, a few are slightly raised. Skin is dry. Rest of skin is normal  Psych: normal mood, affect, hygiene and grooming Neuro: alert and oriented, cranial nerves grossly intact, normal gait   ASSESSMENT/PLAN:  Rash - asymptomatic (not painful or itchy), LE's only.  Rec moisturize and observe. HC prn itching. - Plan: CBC with Differential/Platelet  Petechiae - limited to LE's. Will check CBC - Plan: CBC with Differential/Platelet  Please stay well hydrated (that is likely what is contributing to your leg cramps). Moisturize your legs twice daily. We are checking your blood to make sure that your platelets are okay, and to evaluate for any other abnormalities. I would continue to observe for any changes. If it becomes itchy you can use an over-the-counter hydrocortisone cream twice daily as needed. Let us  know if things are changing/spreading to other parts of your body.

## 2023-10-18 NOTE — Patient Instructions (Addendum)
   Please stay well hydrated (that is likely what is contributing to your leg cramps). Moisturize your legs twice daily. We are checking your blood to make sure that your platelets are okay, and to evaluate for any other abnormalities. I would continue to observe for any changes. If it becomes itchy you can use an over-the-counter hydrocortisone cream twice daily as needed. Let us  know if things are changing/spreading to other parts of your body. Feel free to send us  photos through MyChart if it is changing.

## 2023-12-09 ENCOUNTER — Other Ambulatory Visit: Payer: Self-pay | Admitting: Family Medicine

## 2023-12-09 DIAGNOSIS — F419 Anxiety disorder, unspecified: Secondary | ICD-10-CM

## 2023-12-09 DIAGNOSIS — F325 Major depressive disorder, single episode, in full remission: Secondary | ICD-10-CM

## 2023-12-09 DIAGNOSIS — E782 Mixed hyperlipidemia: Secondary | ICD-10-CM

## 2023-12-09 DIAGNOSIS — G4700 Insomnia, unspecified: Secondary | ICD-10-CM

## 2024-01-02 NOTE — Patient Instructions (Incomplete)
 HEALTH MAINTENANCE RECOMMENDATIONS:  It is recommended that you get at least 30 minutes of aerobic exercise at least 5 days/week (for weight loss, you may need as much as 60-90 minutes). This can be any activity that gets your heart rate up. This can be divided in 10-15 minute intervals if needed, but try and build up your endurance at least once a week.  Weight bearing exercise is also recommended twice weekly.  Eat a healthy diet with lots of vegetables, fruits and fiber.  "Colorful" foods have a lot of vitamins (ie green vegetables, tomatoes, red peppers, etc).  Limit sweet tea, regular sodas and alcoholic beverages, all of which has a lot of calories and sugar.  Up to 1 alcoholic drink daily may be beneficial for women (unless trying to lose weight, watch sugars).  Drink a lot of water.  Calcium recommendations are 1200-1500 mg daily (1500 mg for postmenopausal women or women without ovaries), and vitamin D 1000 IU daily.  This should be obtained from diet and/or supplements (vitamins), and calcium should not be taken all at once, but in divided doses.  Monthly self breast exams and yearly mammograms for women over the age of 12 is recommended.  Sunscreen of at least SPF 30 should be used on all sun-exposed parts of the skin when outside between the hours of 10 am and 4 pm (not just when at beach or pool, but even with exercise, golf, tennis, and yard work!)  Use a sunscreen that says "broad spectrum" so it covers both UVA and UVB rays, and make sure to reapply every 1-2 hours.  Remember to change the batteries in your smoke detectors when changing your clock times in the spring and fall. Carbon monoxide detectors are recommended for your home.  Use your seat belt every time you are in a car, and please drive safely and not be distracted with cell phones and texting while driving.    Lindsey Frank , Thank you for taking time to come for your Medicare Wellness Visit. I appreciate your ongoing  commitment to your health goals. Please review the following plan we discussed and let me know if I can assist you in the future.   This is a list of the screening recommended for you and due dates:  Health Maintenance  Topic Date Due   Zoster (Shingles) Vaccine (1 of 2) 01/03/1997   DTaP/Tdap/Td vaccine (3 - Td or Tdap) 04/17/2020   COVID-19 Vaccine (4 - 2024-25 season) 01/18/2024*   Medicare Annual Wellness Visit  01/02/2025   Pneumonia Vaccine  Completed   Flu Shot  Completed   DEXA scan (bone density measurement)  Completed   Hepatitis C Screening  Completed   HPV Vaccine  Aged Out   Colon Cancer Screening  Discontinued  *Topic was postponed. The date shown is not the original due date.   Yearly mammograms are recommended.  Your last one was in 2020. Please schedule screening mammogram.  You are past due for the following recommended vaccines: Tetanus (TdaP) Shingles (Shingrix)--a series of 2 shots given 2 months apart. RSV vaccine.  You need to get all of these vaccines from the pharmacy.  At this point, you can wait until the Fall to get the RSV vaccine (you can get it the same day as your flu shot, if you choose to get the flu shot from the pharmacy, or separate them by 2 weeks). The other vaccines you can get now.  Please bring [place] copies of your  Living Will and Healthcare Power of Attorney once completed and notarized so that it can be scanned into your medical chart.  You declined any further screening for colon cancer.  We discussed the fact that your last pap smear in 2020 was abnormal, and that you were supposed to return to the gynecologist a year later to have the pap repeated.  It has now been 5 years, and you cancelled your appointment with Dr. Hyacinth Meeker in October.

## 2024-01-02 NOTE — Progress Notes (Unsigned)
 No chief complaint on file.  Lindsey Frank is a 78 y.o. female who presents for annual physical exam, Medicare wellness visit and follow-up on chronic medical conditions.    She was last seen in January with rash on LE's.  Impaired fasting glucose/pre-diabetes: she continues to try and limit her sweets. She gave up eating chocolate kisses, only occasionally has a cookie. Limits rice, pasta, uses whole wheat bread. Doesn't eat a lot of starchy vegetables.  Alcohol--small amount of [place] up to 2x/week.  Last A1c was 6.0% in 06/2023. She continues on metformin 500 mg daily (started at her physical in 12/2022 due to her neuropathy, helps some)    Mixed hyperlipidemia:   She reports being compliant with her Crestor, denies side effects. Patient is reportedly following a low-fat, low cholesterol diet. Doesn't take fish oil, due to causing bad breath. TG slightly elevated a 157 on last check, LDL was 70.  Due for recheck.  Lab Results  Component Value Date   CHOL 175 12/28/2022   HDL 79 12/28/2022   LDLCALC 70 12/28/2022   TRIG 157 (H) 12/28/2022   CHOLHDL 2.2 12/28/2022      GERD--She takes Prilosec OTC daily, and denies symptoms. She previously had recurrent heartburn if she missed medication in the past, hasn't missed any recently.  Denies dysphagia.   Depression--She has h/o depression, with recurrences when she tried stopping sertraline while she was still working.  She was able to stop it a few years ago, without any recurrent depression, but she had recurrent insomnia.  Prior to restarting sertraline she tried trazodone, didn't tolerate due to nausea.  Insomnia improved when sertraline was restarted, but required increase of dose to 100mg . She continues to take 100 mg of sertraline; denies depression and is sleeping well.   There is a strong family h/o suicide in her family (5 people), and because of this is not interested in trying to stop the sertraline again (and it is  effective in treating her insomnia).   Vitamin B12 was noted to be low in 05/2021 (271). She continues to take oral supplements, and last level was normal/high.  She has chronic numbness/tingling in her feet, somewhat improved. Denies pain in her feet. No memory issues.   Lab Results  Component Value Date   VITAMINB12 1,358 (H) 06/15/2022      Tremor, neuropathy, balance problems, h/o frequent falls, memory loss:  under the care of neuro, last saw Dr. Frances Furbish in 10/2022.  Tremor fluctuates, is tolerable (no longer on propranolol or clonazepam, is a little worse than when she was on medication).  Sometimes it is more noticeable than others, sometimes noticeable in her handwriting. Overall doesn't impact her ADLs. Memory has been stable (MMSE was 30/30 in 04/2022). Still has numbness around the outside edges of both feet, and inner heel area. It no longer affects her balance (other than when turning), no longer notices the numbness at her big toes. She hasn't had any falls.  Denies pain. 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 Additional lab workup by neuro was normal. She has no f/u scheduled with neuro.   Tobacco use:  smokes 3 cigarettes/day.     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 She saw Dr. Hyacinth Meeker, and was due to see her again for repeat pap and HPV in 10/2019 (1 year follow-up).  She never scheduled this.  She was reminded of this at her subsequent physicals with me.  She was scheduled to see Dr. Hyacinth Meeker 07/2023, but cancelled her appointment/    Immunization History  Administered Date(s) Administered   Fluad Quad(high Dose 65+) 08/25/2019, 09/15/2020, 07/18/2021, 08/31/2022   Fluad Trivalent(High Dose 65+) 07/05/2023   Influenza Split 08/16/2012, 07/28/2014   Influenza, High Dose Seasonal PF 06/16/2015, 07/05/2016, 08/16/2017, 08/19/2018   PFIZER(Purple Top)SARS-COV-2 Vaccination 12/29/2019, 01/26/2020, 09/15/2020    PNEUMOCOCCAL CONJUGATE-20 12/28/2022   Pneumococcal Conjugate-13 05/27/2014   Pneumococcal Polysaccharide-23 04/17/2010, 07/05/2016   Td 10/16/2000   Tdap 04/17/2010   Zoster, Live 03/03/2007    Last Pap smear:  08/2018, normal (atrophy), +HR HPV; tested negative for 16, 18/45 in 10/2018 by Dr. Hyacinth Meeker. Was due for 1 yr f/u pap, past due. Last mammogram: 09/2019 Last colonoscopy: 8/09; referred for Cologuard in 2019, 2020 and 2021, never done. Not interested in further colon cancer screening. Last DEXA:  08/2018, T-1.1 Left fem neck Dentist: every 6 months  Ophtho: regularly  Exercise:    Walks down the street and back, 15 minutes every day.  Using a walker. No weight-bearing exercise.  Thyroid screen: Lab Results  Component Value Date   TSH 1.870 06/15/2022     Patient Care Team: [name], MD as PCP - General (Family Medicine) GYN (Dr. Julianne Handler due to see Dentist (Dr. Yehuda Mao)   Ophtho (Dr. Burgess Estelle) GI (Dr. Madilyn Fireman at [facility], 2009)   Neuro (Dr. Frances Furbish)  Depression Screening: Flowsheet Row Office Visit from 12/28/2022 in [facility]  PHQ-2 Total Score 0       Falls screen:     12/28/2022    9:37 AM 06/15/2022   10:43 AM 11/24/2021   10:29 AM 11/24/2021    9:24 AM 05/18/2021   10:55 AM  Fall Risk   Falls in the past year? 1 1 1  0 1  Number falls in past yr: 0 0 1 0 1  Comment fell yesterday in bathroom 12/27/22, slipped and was caught by waste basket fell off bed     Injury with Fall? 0 1 1 0 1  Comment  concussion, scrapes concussion, scrapes  scrape on knee and hit head in grocery store and at home  Risk for fall due to : No Fall Risks  History of fall(s);Other (Comment) No Fall Risks History of fall(s)  Follow up Falls evaluation completed  Falls evaluation completed;Education provided Falls evaluation completed Falls evaluation completed     Functional Status Survey:         End of Life Discussion:  Patient has a living will and medical power of  attorney.  These are not in her chart.   PMH, PSH, SH and FH were reviewed and updated.    ROS: The patient denies anorexia, fever, vision changes, decreased hearing, ear pain, sore throat, breast concerns, chest pain, palpitations, syncope, dyspnea on exertion, cough, swelling, nausea, vomiting, diarrhea, constipation, abdominal pain, melena, hematochezia, indigestion/heartburn, hematuria, incontinence, dysuria, vaginal bleeding, discharge, odor or itch, genital lesions, joint pains, weakness, depression, anxiety, abnormal bleeding/bruising, or enlarged lymph nodes.  Some postnasal drainage, chronic, not currently too bothersome. Insomnia is well controlled Numbness in feet balance Mild recurrent tremor since meds stopped, not bothersome. Memory is stable. Moods are good.    PHYSICAL EXAM:  LMP 03/17/1999 (Approximate)    Wt Readings from Last 3 Encounters:  10/18/23 138 lb (62.6 kg)  07/05/23 141 lb 9.6 oz (64.2 kg)  05/23/23 141 lb 6.4 oz (64.1 kg)    General Appearance:   Alert, cooperative, no distress, appears  stated age.  Head:   Normocephalic, without obvious abnormality, atraumatic.  Eyes:   PERRL, conjunctiva/corneas clear, EOM's intact, fundi benign    Ears:   Normal TM's and external ear canals    Nose:   No drainage or sinus tenderness  Throat:   Normal mucosa  Neck:   Supple, no lymphadenopathy; thyroid: no enlargement/tenderness/ nodules; no carotid bruit or JVD    Back:   Spine nontender, no CVA tenderness. Some kyphosis of lower thoracic spine    Lungs:   Clear to auscultation bilaterally without wheezes, rales or ronchi; respirations unlabored    Chest Wall:   No tenderness or deformity    Heart:   Regular rate and rhythm, S1 and S2 normal, no murmur, rub or gallop.   Breast Exam:   Exam declined, deferred to GYN   Abdomen:   Soft, non-tender, nondistended, normoactive bowel sounds, no masses, no hepatosplenomegaly    Genitalia:   Deferred to GYN   Rectal:    Deferred   Extremities:   No clubbing, cyanosis or edema    Pulses:   2+ and symmetric all extremities    Skin:   Skin color, texture, turgor normal, no rashes or lesions. Bruise at left hand (recent trauma per pt, hit on refrigerator)  Lymph nodes:   Cervical, supraclavicular nodes normal    Neurologic:   Normal strength, sensation; reflexes are hyperreflexic (3+) and symmetric throughout. Only minimal tremor noted on R, and no head-bobbing noted.                              Psych:  Normal mood, affect, hygiene and grooming  A1c  ***ENTER BREAST/PELVIC/RECTAL IF DONE Update tremor   ASSESSMENT/PLAN:  Abnormal pap in 2020.  She cancelled her appt with Dr. Hyacinth Meeker in 07/2023, didn't r/s. Does she want breast exam, pelvic and pap today, or to r/s with GYN?? Past due for mammo  Did she get TdaP, RSV or shingrix from pharmacy??  Needs to! Offer/decline COVID  May need full MMSE if mini-cog is off, or if she seems off to you (last MMSE was normal in 04/2022).   Discussed monthly self breast exams and yearly mammograms (past due, reminded to schedule); at least 30 minutes of aerobic activity at least 5 days/week, weight bearing exercise 2x/week; proper sunscreen use reviewed; healthy diet, including goals of calcium and vitamin D intake and alcohol recommendations (less than or equal to 1 drink/day) reviewed; regular seatbelt use; changing batteries in smoke detectors. Immunization recommendations discussed--continue yearly high dose flu shots  COVID booster recommended, declined.  Shingrix recommended, risks/side effects reviewed, to get from pharmacy. Tdap due, to get from pharmacy.   Encouraged her to get the RSV vaccine in the Fall. Colon cancer screening recommendations reviewed, past due. She declines Cologuard, hemoccult or colonoscopy. Understands the risk of much later diagnosis of cancer, declines any screening.  F/u 6 months (A1c at visit), and schedule CPE/AWV for 1 year  MOST  form reviewed. Full Code, Full Care. Need copy of her living will/HC POA, asked to get.     Medicare Attestation I have personally reviewed: The patient's medical and social history Their use of alcohol, tobacco or illicit drugs Their current medications and supplements The patient's functional ability including ADLs,fall risks, home safety risks, cognitive, and hearing and visual impairment Diet and physical activities Evidence for depression or mood disorders  The patient's weight, height, BMI have been  recorded in the chart.  I have made referrals, counseling, and provided education to the patient based on review of the above and I have provided the patient with a written personalized care plan for preventive services.

## 2024-01-03 ENCOUNTER — Ambulatory Visit: Payer: Medicare HMO | Admitting: Family Medicine

## 2024-01-03 ENCOUNTER — Encounter: Payer: Self-pay | Admitting: Family Medicine

## 2024-01-03 VITALS — BP 114/64 | HR 64 | Ht 63.0 in | Wt 134.2 lb

## 2024-01-03 DIAGNOSIS — R7301 Impaired fasting glucose: Secondary | ICD-10-CM

## 2024-01-03 DIAGNOSIS — Z0000 Encounter for general adult medical examination without abnormal findings: Secondary | ICD-10-CM

## 2024-01-03 DIAGNOSIS — G250 Essential tremor: Secondary | ICD-10-CM | POA: Diagnosis not present

## 2024-01-03 DIAGNOSIS — K219 Gastro-esophageal reflux disease without esophagitis: Secondary | ICD-10-CM

## 2024-01-03 DIAGNOSIS — G319 Degenerative disease of nervous system, unspecified: Secondary | ICD-10-CM

## 2024-01-03 DIAGNOSIS — G629 Polyneuropathy, unspecified: Secondary | ICD-10-CM

## 2024-01-03 DIAGNOSIS — F17200 Nicotine dependence, unspecified, uncomplicated: Secondary | ICD-10-CM | POA: Diagnosis not present

## 2024-01-03 DIAGNOSIS — R634 Abnormal weight loss: Secondary | ICD-10-CM

## 2024-01-03 DIAGNOSIS — E538 Deficiency of other specified B group vitamins: Secondary | ICD-10-CM

## 2024-01-03 DIAGNOSIS — F325 Major depressive disorder, single episode, in full remission: Secondary | ICD-10-CM

## 2024-01-03 DIAGNOSIS — Z720 Tobacco use: Secondary | ICD-10-CM

## 2024-01-03 DIAGNOSIS — Z5181 Encounter for therapeutic drug level monitoring: Secondary | ICD-10-CM | POA: Diagnosis not present

## 2024-01-03 DIAGNOSIS — E782 Mixed hyperlipidemia: Secondary | ICD-10-CM

## 2024-01-03 DIAGNOSIS — R252 Cramp and spasm: Secondary | ICD-10-CM

## 2024-01-03 DIAGNOSIS — G6289 Other specified polyneuropathies: Secondary | ICD-10-CM

## 2024-01-03 LAB — POCT GLYCOSYLATED HEMOGLOBIN (HGB A1C): Hemoglobin A1C: 5.9 % — AB (ref 4.0–5.6)

## 2024-01-03 MED ORDER — METFORMIN HCL ER 500 MG PO TB24
1000.0000 mg | ORAL_TABLET | Freq: Every day | ORAL | 1 refills | Status: DC
Start: 2024-01-03 — End: 2024-07-21

## 2024-01-04 ENCOUNTER — Other Ambulatory Visit: Payer: Self-pay | Admitting: *Deleted

## 2024-01-04 ENCOUNTER — Ambulatory Visit: Payer: Self-pay

## 2024-01-04 DIAGNOSIS — E876 Hypokalemia: Secondary | ICD-10-CM

## 2024-01-04 LAB — COMPREHENSIVE METABOLIC PANEL
ALT: 31 IU/L (ref 0–32)
AST: 39 IU/L (ref 0–40)
Albumin: 4.5 g/dL (ref 3.8–4.8)
Alkaline Phosphatase: 53 IU/L (ref 44–121)
BUN/Creatinine Ratio: 13 (ref 12–28)
BUN: 17 mg/dL (ref 8–27)
Bilirubin Total: 0.4 mg/dL (ref 0.0–1.2)
CO2: 26 mmol/L (ref 20–29)
Calcium: 9.9 mg/dL (ref 8.7–10.3)
Chloride: 101 mmol/L (ref 96–106)
Creatinine, Ser: 1.28 mg/dL — ABNORMAL HIGH (ref 0.57–1.00)
Globulin, Total: 1.9 g/dL (ref 1.5–4.5)
Glucose: 97 mg/dL (ref 70–99)
Potassium: 3.1 mmol/L — ABNORMAL LOW (ref 3.5–5.2)
Sodium: 145 mmol/L — ABNORMAL HIGH (ref 134–144)
Total Protein: 6.4 g/dL (ref 6.0–8.5)
eGFR: 43 mL/min/{1.73_m2} — ABNORMAL LOW (ref >59)

## 2024-01-04 LAB — LIPID PANEL
Chol/HDL Ratio: 1.7 ratio (ref 0.0–4.4)
Cholesterol, Total: 117 mg/dL (ref 100–199)
HDL: 67 mg/dL (ref >39)
LDL Chol Calc (NIH): 31 mg/dL (ref 0–99)
Triglycerides: 108 mg/dL (ref 0–149)
VLDL Cholesterol Cal: 19 mg/dL (ref 5–40)

## 2024-01-04 LAB — TSH: TSH: 2.64 u[IU]/mL (ref 0.450–4.500)

## 2024-01-04 LAB — MAGNESIUM: Magnesium: 1.7 mg/dL (ref 1.6–2.3)

## 2024-01-04 NOTE — Telephone Encounter (Signed)
 Called patient back to try to schedule lab appt 1-2 weeks, left message.

## 2024-01-04 NOTE — Progress Notes (Signed)
 Please contact pt with results-- (and wish her a happy birthday!) Normal sugar and liver tests. Kidney tests were abnormal--it is important that she avoid all NSAIDS (ibuprofen/advil/motrin/aleve/naproxen etc) and stay well hydrated. Her potassium was low--I'm not sure why.  This is what is causing her cramps in her feet. Her Mg level was normal. Have her eat some bananas or other potassium-rich foods (there is a handout within epic) I don't have that she takes any kind of diuretic, and she didn't mention vomiting or diarrhea. See if she tells you anything differently.  People don't usually need to take potassium supplements without being on meds that reduce potassium, or issues with chronic diarrhea/vomiting. We should recheck a b-met in 1-2 weeks (dx hypokalemia) Cholesterol is at goal (actually is significantly better than on last check, LDL is less than half than last check, I don't think we changed the dose at all, but please verify), can continue current dose.  Triglycerides are normal (because she is doing well with her diet, cutting out the chocolate).

## 2024-01-04 NOTE — Telephone Encounter (Signed)
  Chief Complaint: Lab results  Disposition: [] ED /[] Urgent Care (no appt availability in office) / [] Appointment(In office/virtual)/ []  [facility]/ [x] Home Care/ [] Refused Recommended Disposition /[] [facility]/ []  Follow-up with PCP Additional Notes: Patient returning call for lab results from yesterday. Relayed information from PCP note dated 01/03/24. Patient denies NVD, diuretic. Patient verbalized understanding and denies further questions at this time.  Please note, this RN did not see lab orders placed for recheck- patient will need contacted and scheduled for labs once orders are entered.   Reason for Disposition  [1] Caller requesting NON-URGENT health information AND [2] PCP's office is the best resource  Answer Assessment - Initial Assessment Questions 1. REASON FOR CALL or QUESTION: "What is your reason for calling today?" or "How can I best help you?" or "What question do you have that I can help answer?"     Returning call regarding labs  Protocols used: Information Only Call - No Triage-A-AH

## 2024-01-16 ENCOUNTER — Other Ambulatory Visit

## 2024-01-16 DIAGNOSIS — E876 Hypokalemia: Secondary | ICD-10-CM

## 2024-01-16 LAB — BASIC METABOLIC PANEL WITH GFR
BUN/Creatinine Ratio: 12 (ref 12–28)
BUN: 14 mg/dL (ref 8–27)
CO2: 25 mmol/L (ref 20–29)
Calcium: 10 mg/dL (ref 8.7–10.3)
Chloride: 103 mmol/L (ref 96–106)
Creatinine, Ser: 1.16 mg/dL — ABNORMAL HIGH (ref 0.57–1.00)
Glucose: 101 mg/dL — ABNORMAL HIGH (ref 70–99)
Potassium: 4.3 mmol/L (ref 3.5–5.2)
Sodium: 143 mmol/L (ref 134–144)
eGFR: 49 mL/min/{1.73_m2} — ABNORMAL LOW (ref >59)

## 2024-01-17 ENCOUNTER — Encounter: Payer: Self-pay | Admitting: Family Medicine

## 2024-03-06 ENCOUNTER — Other Ambulatory Visit: Payer: Self-pay | Admitting: Family Medicine

## 2024-03-06 DIAGNOSIS — F325 Major depressive disorder, single episode, in full remission: Secondary | ICD-10-CM

## 2024-03-06 DIAGNOSIS — G4700 Insomnia, unspecified: Secondary | ICD-10-CM

## 2024-03-06 DIAGNOSIS — E782 Mixed hyperlipidemia: Secondary | ICD-10-CM

## 2024-03-06 DIAGNOSIS — F419 Anxiety disorder, unspecified: Secondary | ICD-10-CM

## 2024-07-19 ENCOUNTER — Other Ambulatory Visit: Payer: Self-pay | Admitting: Family Medicine

## 2024-07-19 DIAGNOSIS — G629 Polyneuropathy, unspecified: Secondary | ICD-10-CM

## 2024-07-19 DIAGNOSIS — R7301 Impaired fasting glucose: Secondary | ICD-10-CM

## 2024-07-22 NOTE — Patient Instructions (Incomplete)
 It is important that you schedule your GYN exam with Dr. Cleotilde. You are long past due for another pap smear, with you having persistent HPV on your last one. HPV is the virus that can cause cervical cancer. Please contact Dr. Dianne office and schedule your visit.  As we discussed at your physical:  You are past due for the following recommended vaccines: Tetanus (TdaP)--this was due in 2021 Shingles (Shingrix)--a series of 2 shots given 2 months apart. RSV vaccine.   You need to get all of these vaccines from the pharmacy (at no cost to you, covered by Medicare).    I recommend getting the RSV vaccine now (wait 2 weeks from today's vaccine), as this is a winter illness, and getting this vaccine in the Fall is recommended.  The others can be gotten at any time, but since they are long past due, I suggest getting these sooner rather than later, separating them by 2 weeks.

## 2024-07-22 NOTE — Progress Notes (Unsigned)
 No chief complaint on file.  Patient presents for 6 month follow-up on chronic problems.  At her physical in March, she complained about cramps in feet and toes.  Her potassium was low at 3.1, and Cr elevated at 1.28.  She was encouraged to eat potassium-rich foods, stay well hydrated and void NSAIDs.  B-met was improved--K+ normal, Cr improved some.  She denies any recent cramping. ***UPDATE    Chemistry      Component Value Date/Time   NA 143 01/16/2024 1034   K 4.3 01/16/2024 1034   CL 103 01/16/2024 1034   CO2 25 01/16/2024 1034   BUN 14 01/16/2024 1034   CREATININE 1.16 (H) 01/16/2024 1034   CREATININE 0.91 08/16/2017 1112      Component Value Date/Time   CALCIUM  10.0 01/16/2024 1034   ALKPHOS 53 01/03/2024 1129   AST 39 01/03/2024 1129   ALT 31 01/03/2024 1129   BILITOT 0.4 01/03/2024 1129      Impaired fasting glucose/pre-diabetes: she continues to try and limit her sweets. She gave up eating chocolate kisses, only occasionally has a cookie. Limits rice, pasta, uses whole wheat bread. Doesn't eat a lot of starchy vegetables.  Alcohol--small amount of [place] daily. Last A1c was 5.9% in 12/2023. She continues on metformin  500 mg daily (started at her physical in 12/2022 due to her neuropathy, reported it helped some.     Mixed hyperlipidemia:   She reports being compliant with her Crestor , denies side effects. Patient is reportedly following a low-fat, low cholesterol diet. Doesn't take fish oil, due to causing bad breath. Lipids were at goal on last check. Denies dietary changes.   Lab Results  Component Value Date   CHOL 117 01/03/2024   HDL 67 01/03/2024   LDLCALC 31 01/03/2024   TRIG 108 01/03/2024   CHOLHDL 1.7 01/03/2024     GERD--She takes Prilosec OTC daily, and denies symptoms. She previously had recurrent heartburn if she missed medication in the past; hasn't missed any recently.  Denies dysphagia.   Depression--She has h/o depression, with recurrences  when she tried stopping sertraline  while she was still working.  She was able to stop it a few years ago, without any recurrent depression, but she had recurrent insomnia.  Prior to restarting sertraline  she tried trazodone , didn't tolerate due to nausea.  Insomnia improved when sertraline  was restarted, but required increase of dose to 100mg . She continues to take 100 mg of sertraline ; denies depression and is sleeping well.   There is a strong family h/o suicide in her family (5 people), and because of this is not interested in trying to stop the sertraline  again (and it is effective in treating her insomnia).   Vitamin B12 was noted to be low in 05/2021 (271). She continues to take oral supplements, and last level was normal/high.  She has chronic numbness/tingling in her feet, somewhat improved. Denies pain in her feet. No memory issues.   Lab Results  Component Value Date   VITAMINB12 1,358 (H) 06/15/2022     Tremor, neuropathy, balance problems, h/o frequent falls, memory loss:  previously seeing Dr. Buck (neuro), last seen in 10/2022 with no f/u appts scheduled. She reports that her tremor fluctuates, is tolerable (no longer on propranolol  or clonazepam , is a little worse than when she was on medication).   Sometimes it is more noticeable than others, sometimes noticeable in her handwriting. Overall doesn't impact her ADLs.  Memory has been stable (MMSE was 30/30 in 04/2022).  Denies any memory concerns.  Still has numbness around the outside edges of both feet, and inner heel area. It no longer affects her balance (other than when turning), no longer notices the numbness at her big toes. She hasn't had any falls.  Denies pain. 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 She has no f/u scheduled with neuro.       05/15/2022   10:32 AM 11/14/2021    8:58 AM 05/23/2021    9:05 AM  MMSE -  Mini Mental State Exam  Orientation to time 5 4 2   Orientation to Place 5 5 5   Registration 3 3 3   Attention/ Calculation 5 5 2   Recall 3 2 1   Language- name 2 objects 2 2 2   Language- repeat 1 1 1   Language- follow 3 step command 3 3 2   Language- read & follow direction 1 0 1  Write a sentence 1 0 1  Copy design 1 1 0  Total score 30 26 20     Tobacco use:  Smoking 2 packs/week. 3-5 cigarettes/day.  ***UPDATE There's a lot to it as far as reasons for smoking. Some it habit. Prefers not to discuss with me today.   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 She saw Dr. Cleotilde, and was due to see her again for repeat pap and HPV in 10/2019 (1 year follow-up).  She never scheduled this.  She was reminded of this at her subsequent physicals with me. She was finally scheduled to see Dr. Cleotilde 07/2023, but cancelled her appointment. At her last physical, she reported she planned to reschedule this, but never did (she declined me doing her pap at that visit).      PMH, PSH, SH reviewed    ROS: Denies fever, chills, URI symptoms, headaches, shortness of breath, chest pain.   Denies nausea, vomiting, heartburn, bowel changes, urinary complaints, bleeding, bruising, rash. Denies lightheadedness, vertigo, falls. Tremor per HPI, stable. Numbness in feet per HPI. Moods are good Sleeping well, no insomnia     PHYSICAL EXAM:  LMP 03/17/1999 (Approximate)   Wt Readings from Last 3 Encounters:  01/03/24 134 lb 3.2 oz (60.9 kg)  10/18/23 138 lb (62.6 kg)  07/05/23 141 lb 9.6 oz (64.2 kg)   Pleasant female, in no distress. HEENT: conjunctiva and sclera are clear, EOMI.  Neck: no lymphadenopathy or mass, no bruit. Heart: regular rate and rhythm Lungs: clear bilaterally Abdomen: soft, nontender Extremities: no edema Back: no spinal or CVA tenderness.  Some kyphosis/poor posture with slouching forward. Neuro: alert, oriented. Walking with a cane. Cranial nerves grossly  iintact.  Normal speech.  No head bobbing, minimal tremor. Hyper-reflexic throughout, upper and lower extremities. Psych: normal mood, affect, hygiene and grooming.   ***UPDATE--using cane? Reflexes, tremor.   ASSESSMENT/PLAN:   Still smoking? If any changes to her numbness/burning, balance or memory, needs to schedule f/u with neuro. If not taking B12, then we can check CBC and B12 (doesn't need if she is still taking it).  She never scheduled to see GYN Dr. Cleotilde.  (Cancelled appt for 07/2023).  She had abnormal pap the last time it was done.  It is important that she schedules f/u with Dr. Cleotilde!  Did she ever get shingrix, Tdap or RSV from pharmacy?  If not, these are recommended. Flu and COVID today (or decline).   B-met due to hypokalemia at physical (recheck  to ensure normal)

## 2024-07-23 ENCOUNTER — Ambulatory Visit: Admitting: Family Medicine

## 2024-07-23 ENCOUNTER — Encounter: Payer: Self-pay | Admitting: Family Medicine

## 2024-07-23 VITALS — BP 120/74 | HR 64 | Ht 63.0 in | Wt 121.4 lb

## 2024-07-23 DIAGNOSIS — Z23 Encounter for immunization: Secondary | ICD-10-CM

## 2024-07-23 DIAGNOSIS — R7301 Impaired fasting glucose: Secondary | ICD-10-CM

## 2024-07-23 DIAGNOSIS — E538 Deficiency of other specified B group vitamins: Secondary | ICD-10-CM

## 2024-07-23 DIAGNOSIS — R7989 Other specified abnormal findings of blood chemistry: Secondary | ICD-10-CM

## 2024-07-23 DIAGNOSIS — E782 Mixed hyperlipidemia: Secondary | ICD-10-CM | POA: Diagnosis not present

## 2024-07-23 DIAGNOSIS — E876 Hypokalemia: Secondary | ICD-10-CM

## 2024-07-23 DIAGNOSIS — R252 Cramp and spasm: Secondary | ICD-10-CM

## 2024-07-23 DIAGNOSIS — Z720 Tobacco use: Secondary | ICD-10-CM

## 2024-07-23 DIAGNOSIS — G319 Degenerative disease of nervous system, unspecified: Secondary | ICD-10-CM

## 2024-07-23 DIAGNOSIS — G250 Essential tremor: Secondary | ICD-10-CM

## 2024-07-23 DIAGNOSIS — G629 Polyneuropathy, unspecified: Secondary | ICD-10-CM

## 2024-07-23 DIAGNOSIS — F325 Major depressive disorder, single episode, in full remission: Secondary | ICD-10-CM

## 2024-07-23 DIAGNOSIS — R87618 Other abnormal cytological findings on specimens from cervix uteri: Secondary | ICD-10-CM

## 2024-07-23 DIAGNOSIS — K219 Gastro-esophageal reflux disease without esophagitis: Secondary | ICD-10-CM

## 2024-07-23 LAB — POCT GLYCOSYLATED HEMOGLOBIN (HGB A1C): Hemoglobin A1C: 6.1 % — AB (ref 4.0–5.6)

## 2024-07-23 MED ORDER — METFORMIN HCL ER 500 MG PO TB24: 1000.0000 mg | ORAL_TABLET | Freq: Every day | ORAL | 1 refills | Status: AC

## 2024-07-24 ENCOUNTER — Ambulatory Visit: Payer: Self-pay | Admitting: Family Medicine

## 2024-07-24 LAB — BASIC METABOLIC PANEL WITH GFR
BUN/Creatinine Ratio: 13 (ref 12–28)
BUN: 19 mg/dL (ref 8–27)
CO2: 26 mmol/L (ref 20–29)
Calcium: 11.7 mg/dL — ABNORMAL HIGH (ref 8.7–10.3)
Chloride: 100 mmol/L (ref 96–106)
Creatinine, Ser: 1.47 mg/dL — AB (ref 0.57–1.00)
Glucose: 100 mg/dL — ABNORMAL HIGH (ref 70–99)
Potassium: 3.9 mmol/L (ref 3.5–5.2)
Sodium: 145 mmol/L — ABNORMAL HIGH (ref 134–144)
eGFR: 37 mL/min/1.73 — AB (ref >59)

## 2024-07-24 NOTE — Progress Notes (Signed)
 Please advise pt that her kidney function was significantly worse than last check. This could be related to being dehydrated.  Also ensure that she isn't taking any ibuprofen or other NSAIDs, which hurt the kidneys.  Her potassium was fine. Her calcium  was very high.  I'd like for her to return for an intact PTH with calcium  (there is an order for that), with dx of hypercalcemia.  I don't think we can add that on (feel free to check with [facility]), I think she needs to come back for a nonfasting lab visit. She is taking calcium  twice daily.  She needs to do that only if she isn't getting much other calcium  sources. If she drinks milk daily, or daily yogurt, cheese or other calcium  sources, can take it just once daily.

## 2024-07-29 ENCOUNTER — Encounter: Payer: Self-pay | Admitting: *Deleted

## 2024-08-22 ENCOUNTER — Other Ambulatory Visit: Payer: Self-pay

## 2024-08-23 LAB — PTH, INTACT AND CALCIUM
Calcium: 9.5 mg/dL (ref 8.7–10.3)
PTH: 22 pg/mL (ref 15–65)

## 2024-08-24 ENCOUNTER — Ambulatory Visit: Payer: Self-pay | Admitting: Family Medicine

## 2024-08-25 ENCOUNTER — Other Ambulatory Visit: Payer: Self-pay | Admitting: Family Medicine

## 2024-08-25 DIAGNOSIS — E782 Mixed hyperlipidemia: Secondary | ICD-10-CM

## 2024-08-30 ENCOUNTER — Other Ambulatory Visit: Payer: Self-pay | Admitting: Family Medicine

## 2024-08-30 DIAGNOSIS — G4700 Insomnia, unspecified: Secondary | ICD-10-CM

## 2024-08-30 DIAGNOSIS — F325 Major depressive disorder, single episode, in full remission: Secondary | ICD-10-CM

## 2024-08-30 DIAGNOSIS — F419 Anxiety disorder, unspecified: Secondary | ICD-10-CM

## 2025-01-21 ENCOUNTER — Ambulatory Visit: Payer: Self-pay | Admitting: Family Medicine
